# Patient Record
Sex: Female | Born: 1955 | Race: White | Hispanic: No | Marital: Married | Smoking: Never smoker
Health system: Southern US, Community
[De-identification: ages and names within clinical notes are randomized; demographics above are authoritative.]

## PROBLEM LIST (undated history)

## (undated) DIAGNOSIS — R112 Nausea with vomiting, unspecified: Secondary | ICD-10-CM

## (undated) DIAGNOSIS — T8859XA Other complications of anesthesia, initial encounter: Secondary | ICD-10-CM

## (undated) DIAGNOSIS — Z9889 Other specified postprocedural states: Secondary | ICD-10-CM

## (undated) DIAGNOSIS — M1990 Unspecified osteoarthritis, unspecified site: Secondary | ICD-10-CM

## (undated) DIAGNOSIS — T4145XA Adverse effect of unspecified anesthetic, initial encounter: Secondary | ICD-10-CM

## (undated) DIAGNOSIS — C801 Malignant (primary) neoplasm, unspecified: Secondary | ICD-10-CM

## (undated) HISTORY — PX: TUBAL LIGATION: SHX77

## (undated) HISTORY — PX: BREAST SURGERY: SHX581

## (undated) HISTORY — PX: JOINT REPLACEMENT: SHX530

## (undated) HISTORY — PX: EYE SURGERY: SHX253

## (undated) HISTORY — PX: KNEE ARTHROSCOPY: SHX127

## (undated) HISTORY — PX: REPLACEMENT TOTAL KNEE: SUR1224

## (undated) HISTORY — PX: COLONOSCOPY: SHX174

---

## 1988-06-12 HISTORY — PX: ABDOMINAL HYSTERECTOMY: SHX81

## 2004-06-15 ENCOUNTER — Ambulatory Visit: Payer: Self-pay | Admitting: General Surgery

## 2005-06-15 ENCOUNTER — Ambulatory Visit: Payer: Self-pay | Admitting: General Surgery

## 2005-06-30 ENCOUNTER — Ambulatory Visit: Payer: Self-pay | Admitting: General Surgery

## 2009-07-27 ENCOUNTER — Ambulatory Visit: Payer: Self-pay | Admitting: Unknown Physician Specialty

## 2009-07-27 LAB — HM DEXA SCAN

## 2009-07-30 ENCOUNTER — Ambulatory Visit: Payer: Self-pay | Admitting: Unknown Physician Specialty

## 2010-10-28 ENCOUNTER — Observation Stay: Payer: Self-pay | Admitting: Urology

## 2015-06-28 DIAGNOSIS — M533 Sacrococcygeal disorders, not elsewhere classified: Secondary | ICD-10-CM | POA: Insufficient documentation

## 2015-08-26 ENCOUNTER — Other Ambulatory Visit: Payer: Self-pay | Admitting: Unknown Physician Specialty

## 2015-08-26 ENCOUNTER — Inpatient Hospital Stay
Admission: RE | Admit: 2015-08-26 | Discharge: 2015-08-26 | Disposition: A | Discharge status: Home / Self Care | Payer: Self-pay | Source: Ambulatory Visit | Attending: *Deleted | Admitting: *Deleted

## 2015-08-26 ENCOUNTER — Other Ambulatory Visit: Payer: Self-pay | Admitting: *Deleted

## 2015-08-26 DIAGNOSIS — Z9289 Personal history of other medical treatment: Secondary | ICD-10-CM

## 2015-08-26 DIAGNOSIS — R921 Mammographic calcification found on diagnostic imaging of breast: Secondary | ICD-10-CM

## 2015-09-20 ENCOUNTER — Other Ambulatory Visit: Payer: Self-pay

## 2015-09-20 ENCOUNTER — Ambulatory Visit: Payer: Self-pay

## 2015-09-22 ENCOUNTER — Ambulatory Visit: Payer: Self-pay

## 2015-09-22 ENCOUNTER — Encounter: Payer: Self-pay | Admitting: General Surgery

## 2015-09-22 ENCOUNTER — Ambulatory Visit (INDEPENDENT_AMBULATORY_CARE_PROVIDER_SITE_OTHER): Payer: Self-pay | Admitting: General Surgery

## 2015-09-22 VITALS — BP 128/72 | HR 80 | Resp 12 | Ht 68.0 in | Wt 182.0 lb

## 2015-09-22 DIAGNOSIS — N63 Unspecified lump in unspecified breast: Secondary | ICD-10-CM

## 2015-09-22 NOTE — Patient Instructions (Signed)
Breast Biopsy A breast biopsy is a procedure where a sample of breast tissue is removed from your breast. The tissue is examined under a microscope to see if cancerous cells are present. A breast biopsy is done when there is:  Any undiagnosed breast mass (tumor).  Nipple abnormalities, dimpling, crusting, or ulcerations.  Abnormal discharge from the nipple, especially blood.  Redness, swelling, and pain of the breast.  Calcium deposits (calcifications) or abnormalities seen on a mammogram, ultrasound result, or results of magnetic resonance imaging (MRI).  Suspicious changes in the breast seen on your mammogram. If the tumor is found to be cancerous (malignant), a breast biopsy can help to determine what the best treatment is for you. There are many different types of breast biopsies. Talk to your caregiver about your options and which type is best for you. LET YOUR CAREGIVER KNOW ABOUT:  Allergies to food or medicine.  Medicines taken, including vitamins, herbs, eyedrops, over-the-counter medicines, and creams.  Use of steroids (by mouth or creams).  Previous problems with anesthetics or numbing medicines.  History of bleeding problems or blood clots.  Previous surgery.  Other health problems, including diabetes and kidney problems.  Any recent colds or infections.  Possibility of pregnancy, if this applies. RISKS AND COMPLICATIONS   Bleeding.  Infection.  Allergy to medicines.  Bruising and swelling of the breast.  Alteration in the shape of the breast.  Not finding the lump or abnormality.  Needing more surgery. BEFORE THE PROCEDURE  Arrange for someone to drive you home after the procedure.  Do not smoke for 2 weeks before the procedure. Stop smoking, if you smoke.  Do not drink alcohol for 24 hours before procedure.  Wear a good support bra to the procedure.  Your health care provider may perform a procedure to place a wire (needle localization) or a  seed that gives off radiation (radioactive seed localization) in the breast lump. A mammogram or ultrasound is done during this procedure to help with proper placement. The wire or seed will help the health care provider locate the lump when performing the biopsy, especially if the lump cannot be felt. PROCEDURE  You may be given a medicine to numb the breast area (local anesthesia) or a medicine to make you sleep (general anesthesia) during the procedure. The following are the different types of biopsies that can be performed.   Fine-needle aspiration--A thin needle is attached to a syringe and inserted into the breast lump. Fluid and cells are removed and then looked at under a microscope. If the breast lump cannot be felt, an ultrasound may be used to help locate the lump and place the needle in the correct area.   Core needle biopsy--A wide, hollow needle (core needle) is inserted into the breast lump 3-6 times to get tissue samples or cores. The samples are removed. The needle is usually placed in the correct area by using an ultrasound or X-ray.   Stereotactic biopsy--X-ray equipment and a computer are used to analyze X-ray pictures of the breast lump. The computer then finds exactly where the core needle needs to be inserted. Tissue samples are removed.   Vacuum-assisted biopsy--A small incision (less than  inch) is made in your breast. A biopsy device that includes a hollow needle and vacuum is passed through the incision and into the breast tissue. The vacuum gently draws abnormal breast tissue into the needle to remove it. This type of biopsy removes a larger tissue sample than a regular  core needle biopsy. No stitches are needed, and there is usually little scarring.  Ultrasound-guided core needle biopsy--A high frequency ultrasound helps guide the core needle to the area of the mass or abnormality. An incision is made to insert the needle. Tissue samples are removed.  Open biopsy--A  larger incision is made in the breast. Your caregiver will attempt to remove the whole breast lump or as much as possible. AFTER THE PROCEDURE  You will be taken to the recovery area. If you are doing well and have no problems, you will be allowed to go home.  You may notice bruising on your breast. This is normal.  Your caregiver may apply a pressure dressing on your breast for 24-48 hours. A pressure dressing is a bandage that is wrapped tightly around the chest to stop fluid from collecting underneath tissues.   This information is not intended to replace advice given to you by your health care provider. Make sure you discuss any questions you have with your health care provider.   Document Released: 05/29/2005 Document Revised: 02/17/2015 Document Reviewed: 06/29/2011 Elsevier Interactive Patient Education [employer].

## 2015-09-22 NOTE — Progress Notes (Signed)
Patient ID: Colleen Price, female   DOB: November 22, 1955, 60 y.o.   MRN: [id number]  Chief Complaint  Patient presents with  . Other    mammogram    HPI Colleen Price is a 60 y.o. female who presents for a breast evaluation. The most recent mammogram was done on 09/17/15.  Patient does perform regular self breast checks and gets regular mammograms done.  She states denies pain/tenderness, no lumps. Denies injury to the breasts. She states she was told there was a mass seen ultrasound/mammogram.  I have reviewed the history of present illness with the patient.  HPI  History reviewed. No pertinent past medical history.  Past Surgical History  Procedure Laterality Date  . Knee arthroscopy    . Abdominal hysterectomy  1990  . Colonoscopy      History reviewed. No pertinent family history.  Social History Social History  Substance Use Topics  . Smoking status: Never Smoker   . Smokeless tobacco: Never Used  . Alcohol Use: No    Allergies  Allergen Reactions  . Latex   . Conjugated Estrogens Nausea And Vomiting and Rash    Lupus like symptoms Lupus like symptoms    Current Outpatient Prescriptions  Medication Sig Dispense Refill  . Ascorbic Acid (VITAMIN C) 1000 MG tablet Take by mouth.    . Boswellia-Glucosamine-Vit D (HM GLUCOSAMINE & VITAMIN D3) TABS Take by mouth.    . Multiple Vitamin (MULTI-VITAMINS) TABS Take by mouth.     No current facility-administered medications for this visit.    Review of Systems Review of Systems  Constitutional: Negative.   Respiratory: Negative.   Cardiovascular: Negative.     Blood pressure 128/72, pulse 80, resp. rate 12, height 5\' 8"  (1.727 m), weight 182 lb (82.555 kg).  Physical Exam Physical Exam  Constitutional: She is oriented to person, place, and time. She appears well-developed and well-nourished.  Eyes: Conjunctivae are normal. No scleral icterus.  Neck: Neck supple.  Cardiovascular: Normal rate, regular rhythm and  normal heart sounds.   Pulmonary/Chest: Effort normal and breath sounds normal. Right breast exhibits mass. Right breast exhibits no inverted nipple, no nipple discharge, no skin change and no tenderness. Left breast exhibits no inverted nipple, no mass, no nipple discharge, no skin change and no tenderness.  2.5cm ill defined mass at 10 o'clock right breast  Left breast mass at 1 o'clock  Abdominal: Soft. Bowel sounds are normal. There is no hepatomegaly. There is no tenderness.  Lymphadenopathy:    She has no cervical adenopathy.    She has no axillary adenopathy.  Neurological: She is alert and oriented to person, place, and time.  Skin: Skin is warm and dry.    Data Reviewed Mammogram and [place].  Large suspicious mass right breast 10 ocl location. Left breast with a small suspicious mass at 1 ocl.  Right breast also has other tiny nodules. The 2 main areas of concern also with associated microcalcifications No axillary adenopathy noted Assessment    Highly suspicious findings. Core biopsy of right and left breast masses recommended and completed today with pt consent     Plan    If path confirms malignancy she will benefit with MRI. Pt will be notified when path is available.     PCP:  Ouida Sills  This information has been scribed by Rosita Kea G 09/23/2015, 8:29 AM

## 2015-09-23 ENCOUNTER — Encounter: Payer: Self-pay | Admitting: General Surgery

## 2015-09-27 ENCOUNTER — Ambulatory Visit: Payer: Self-pay

## 2015-09-27 ENCOUNTER — Encounter: Payer: Self-pay | Admitting: General Surgery

## 2015-09-27 ENCOUNTER — Ambulatory Visit (INDEPENDENT_AMBULATORY_CARE_PROVIDER_SITE_OTHER): Payer: Self-pay | Admitting: General Surgery

## 2015-09-27 VITALS — BP 130/80 | HR 80 | Resp 12 | Ht 68.0 in | Wt 182.0 lb

## 2015-09-27 DIAGNOSIS — C50911 Malignant neoplasm of unspecified site of right female breast: Secondary | ICD-10-CM

## 2015-09-27 DIAGNOSIS — R921 Mammographic calcification found on diagnostic imaging of breast: Secondary | ICD-10-CM

## 2015-09-27 NOTE — Progress Notes (Signed)
Received request from Veverly Fells at [facility]  to assess patient qualification for [facility].  Patient has been diagnosed with invasive mammary carcinoma of right breast.  [place] for mammogram results, but patient cancelled this appointment and went to [facility].  Requested Birads 5 results from [facility]. Patient has follow up appointment with Dr. Jamal Collin 09/27/15 at 2:00.  Coordinated [facility] appointment for this afternoon, but after speaking to patient, she states she is comfortable with her financial ability to cover medical expenses, and may not need appointment.  Introduced he to navigation service, and took Breast Cancer Treatment Handbook/folder with hospital services to Dr. Angie Fava office for her to pick up this afternoon.    Oncology Nurse Navigator Documentation  Navigator Location: [facility] (09/27/15 1100) Navigator Encounter Type: Introductory phone call;Telephone;Education;Screening;Diagnostic Results (09/27/15 1100) Telephone: Financial Assistance;Diagnostic Results (09/27/15 1100) Abnormal Finding Date: 09/17/15 (09/27/15 1100) Confirmed Diagnosis Date: 09/22/15 (09/27/15 1100)     Patient Visit Type: Follow-up (09/27/15 1100) Treatment Phase: Pre-Tx/Tx Discussion (09/27/15 1100) Barriers/Navigation Needs: Financial (09/27/15 1100)   Interventions: Coordination of Care (09/27/15 1100)            Acuity: Level 2 (09/27/15 1100)   Acuity Level 2: Initial guidance, education and coordination as needed;Other (Financial) (09/27/15 1100)     Time Spent with Patient: > 120 (09/27/15 1100)

## 2015-09-27 NOTE — Patient Instructions (Signed)
Call [phone] to arrange for bilateral breast MRI at the [facility].

## 2015-09-27 NOTE — Progress Notes (Signed)
This is a 60 year old female here today for results of breast biopsy. I have reviewed the history of present illness with the patient.   Path- right breast with invasuve ductal CA. Left side with FC changes. Pt advised on the report. ER/PR,Her 2 are pending. Pt was given adequate information regarding all factors involved in decision making regarding treatment.  Given the ill defined and likely large size of right breast cancer and finding of other tiny nodules in rt and left breast she will benefit with MRI. Pt is agreeable.     Patient to be scheduled for a bilateral breast MRI at the [facility]. This patient is to call and schedule MRI.   This patient was sent to have the following labs drawn at [facility] today: CBC, Met C, and CA 27-29.  PCP:  Ouida Sills This information has been scribed by Gaspar Cola CMA.

## 2015-09-28 ENCOUNTER — Telehealth: Payer: Self-pay | Admitting: *Deleted

## 2015-09-28 LAB — COMPREHENSIVE METABOLIC PANEL
A/G RATIO: 1.7 (ref 1.2–2.2)
ALBUMIN: 4.4 g/dL (ref 3.5–5.5)
ALK PHOS: 78 IU/L (ref 39–117)
ALT: 17 IU/L (ref 0–32)
AST: 20 IU/L (ref 0–40)
BILIRUBIN TOTAL: 0.5 mg/dL (ref 0.0–1.2)
BUN / CREAT RATIO: 21 (ref 9–23)
BUN: 16 mg/dL (ref 6–24)
CHLORIDE: 102 mmol/L (ref 96–106)
CO2: 25 mmol/L (ref 18–29)
Calcium: 9.9 mg/dL (ref 8.7–10.2)
Creatinine, Ser: 0.75 mg/dL (ref 0.57–1.00)
GFR calc non Af Amer: 88 mL/min/{1.73_m2} (ref >59)
GFR, EST AFRICAN AMERICAN: 101 mL/min/{1.73_m2} (ref >59)
GLOBULIN, TOTAL: 2.6 g/dL (ref 1.5–4.5)
Glucose: 102 mg/dL — ABNORMAL HIGH (ref 65–99)
Potassium: 4.9 mmol/L (ref 3.5–5.2)
SODIUM: 143 mmol/L (ref 134–144)
TOTAL PROTEIN: 7 g/dL (ref 6.0–8.5)

## 2015-09-28 LAB — CBC WITH DIFFERENTIAL/PLATELET
BASOS ABS: 0 10*3/uL (ref 0.0–0.2)
BASOS: 0 %
EOS (ABSOLUTE): 0.2 10*3/uL (ref 0.0–0.4)
EOS: 2 %
HEMATOCRIT: 40.7 % (ref 34.0–46.6)
HEMOGLOBIN: 13.6 g/dL (ref 11.1–15.9)
IMMATURE GRANS (ABS): 0 10*3/uL (ref 0.0–0.1)
Immature Granulocytes: 0 %
LYMPHS ABS: 2.8 10*3/uL (ref 0.7–3.1)
LYMPHS: 33 %
MCH: 28.9 pg (ref 26.6–33.0)
MCHC: 33.4 g/dL (ref 31.5–35.7)
MCV: 87 fL (ref 79–97)
MONOCYTES: 12 %
Monocytes Absolute: 1 10*3/uL — ABNORMAL HIGH (ref 0.1–0.9)
NEUTROS ABS: 4.5 10*3/uL (ref 1.4–7.0)
Neutrophils: 53 %
Platelets: 323 10*3/uL (ref 150–379)
RBC: 4.7 x10E6/uL (ref 3.77–5.28)
RDW: 13.6 % (ref 12.3–15.4)
WBC: 8.6 10*3/uL (ref 3.4–10.8)

## 2015-09-28 LAB — CANCER ANTIGEN 27.29: CA 27.29: 23.2 U/mL (ref 0.0–38.6)

## 2015-09-28 NOTE — Telephone Encounter (Signed)
-----   Message from Christene Lye, MD sent at 09/28/2015  7:49 AM EDT ----- Inform pt labs are normal. F/u as scheduled

## 2015-09-28 NOTE — Telephone Encounter (Signed)
Notified patient as instructed, patient pleased. Discussed follow-up appointments, patient agrees  

## 2015-09-28 NOTE — Progress Notes (Signed)
Quick Note:  Inform pt labs are normal. F/u as scheduled ______ 

## 2015-09-29 ENCOUNTER — Ambulatory Visit
Admission: RE | Admit: 2015-09-29 | Discharge: 2015-09-29 | Disposition: A | Discharge status: Home / Self Care | Payer: No Typology Code available for payment source | Source: Ambulatory Visit | Attending: General Surgery | Admitting: General Surgery

## 2015-09-29 DIAGNOSIS — C50911 Malignant neoplasm of unspecified site of right female breast: Secondary | ICD-10-CM

## 2015-09-29 MED ORDER — GADOBENATE DIMEGLUMINE 529 MG/ML IV SOLN
14.0000 mL | Freq: Once | INTRAVENOUS | Status: AC | PRN
  Administered 2015-09-29: 14 mL via INTRAVENOUS

## 2015-10-04 ENCOUNTER — Ambulatory Visit (INDEPENDENT_AMBULATORY_CARE_PROVIDER_SITE_OTHER): Payer: Self-pay | Admitting: General Surgery

## 2015-10-04 ENCOUNTER — Encounter: Payer: Self-pay | Admitting: General Surgery

## 2015-10-04 VITALS — BP 126/70 | HR 74 | Resp 12 | Wt 172.0 lb

## 2015-10-04 DIAGNOSIS — C50911 Malignant neoplasm of unspecified site of right female breast: Secondary | ICD-10-CM

## 2015-10-04 NOTE — Progress Notes (Signed)
This is a 60 year old female here today to discuss MRI done on 09/29/15 and treatment options. I have reviewed the history of present illness with the patient. MRI revealed a 2.1cm mass upper outer right breast. No other suspicious findings on the right or the left. I have discussed in detail patient's options as far as treatment. Patient to decide on lumpectomy with post surgical radiation or right breast mastectomy. Both procedures will require a sentinel node biopsy. Patient expressed interest in taking a holistic approach to her treatment. After reviewing the patients options as well as the risks and benifits of each treatment option, patient has decided to take time to decide which option she would like to procede with. Patient is also reluctant to consider chemotherapy. Mamoprint can be obtained after surgery which may help with this decision making. Patient to call the office with her decision.  PCP:  Ouida Sills This information has been scribed by Gaspar Cola CMA.

## 2015-10-05 ENCOUNTER — Encounter: Payer: Self-pay | Admitting: General Surgery

## 2015-10-06 ENCOUNTER — Telehealth: Payer: Self-pay | Admitting: *Deleted

## 2015-10-06 ENCOUNTER — Other Ambulatory Visit: Payer: Self-pay | Admitting: *Deleted

## 2015-10-06 DIAGNOSIS — C50911 Malignant neoplasm of unspecified site of right female breast: Secondary | ICD-10-CM

## 2015-10-06 NOTE — Telephone Encounter (Signed)
Patient called the office to report that she would like to be scheduled for a right breast lumpectomy.   This will be arranged for 10-14-15 at [facility].

## 2015-10-07 ENCOUNTER — Telehealth: Payer: Self-pay | Admitting: *Deleted

## 2015-10-07 NOTE — Telephone Encounter (Signed)
Message left for patient to call the office.   We need to review surgery instructions with the patient.

## 2015-10-07 NOTE — Telephone Encounter (Signed)
Patient called the office back and she was notified as instructed.   This patient verbalizes understanding.

## 2015-10-11 ENCOUNTER — Other Ambulatory Visit: Payer: Self-pay | Admitting: General Surgery

## 2015-10-11 DIAGNOSIS — C50411 Malignant neoplasm of upper-outer quadrant of right female breast: Secondary | ICD-10-CM

## 2015-10-12 ENCOUNTER — Encounter: Payer: Self-pay | Admitting: *Deleted

## 2015-10-12 ENCOUNTER — Other Ambulatory Visit: Payer: No Typology Code available for payment source

## 2015-10-12 NOTE — Patient Instructions (Signed)
  Your procedure is scheduled on: 10-14-15 Report to [facility] To find out your arrival time please call [phone] between 1PM - 3PM on 10-13-15  Remember: Instructions that are not followed completely may result in serious medical risk, up to and including death, or upon the discretion of your surgeon and anesthesiologist your surgery may need to be rescheduled.    _X___ 1. Do not eat food or drink liquids after midnight. No gum chewing or hard candies.     _X___ 2. No Alcohol for 24 hours before or after surgery.   ____ 3. Bring all medications with you on the day of surgery if instructed.    ____ 4. Notify your doctor if there is any change in your medical condition     (cold, fever, infections).     Do not wear jewelry, make-up, hairpins, clips or nail polish.  Do not wear lotions, powders, or perfumes. You may wear deodorant.  Do not shave 48 hours prior to surgery. Men may shave face and neck.  Do not bring valuables to the hospital.    [facility] is not responsible for any belongings or valuables.               Contacts, dentures or bridgework may not be worn into surgery.  Leave your suitcase in the car. After surgery it may be brought to your room.  For patients admitted to the hospital, discharge time is determined by your treatment team.   Patients discharged the day of surgery will not be allowed to drive home.   Please read over the following fact sheets that you were given:     ____ Take these medicines the morning of surgery with A SIP OF WATER:    1. NONE  2.   3.   4.  5.  6.  ____ Fleet Enema (as directed)   ____ Use CHG Soap as directed  ____ Use inhalers on the day of surgery  ____ Stop metformin 2 days prior to surgery    ____ Take 1/2 of usual insulin dose the night before surgery and none on the morning of surgery.   ____ Stop Coumadin/Plavix/aspirin-N/A  _X___ Stop Anti-inflammatories-NO NSAIDS OR ASA  PRODUCTS-TYLENOL OK TO TAKE   ____ Stop supplements until after surgery.    ____ Bring C-Pap to the hospital.

## 2015-10-14 ENCOUNTER — Ambulatory Visit: Payer: Self-pay

## 2015-10-14 ENCOUNTER — Encounter
Admission: RE | Disposition: A | Discharge status: Home / Self Care | Payer: Self-pay | Source: Ambulatory Visit | Attending: General Surgery

## 2015-10-14 ENCOUNTER — Ambulatory Visit: Payer: Self-pay | Admitting: Certified Registered Nurse Anesthetist

## 2015-10-14 ENCOUNTER — Encounter
Admission: RE | Admit: 2015-10-14 | Discharge: 2015-10-14 | Disposition: A | Discharge status: Home / Self Care | Payer: No Typology Code available for payment source | Source: Ambulatory Visit | Attending: General Surgery | Admitting: General Surgery

## 2015-10-14 ENCOUNTER — Ambulatory Visit
Admission: RE | Admit: 2015-10-14 | Discharge: 2015-10-14 | Disposition: A | Discharge status: Home / Self Care | Payer: Self-pay | Source: Ambulatory Visit | Attending: General Surgery | Admitting: General Surgery

## 2015-10-14 DIAGNOSIS — Z888 Allergy status to other drugs, medicaments and biological substances status: Secondary | ICD-10-CM | POA: Insufficient documentation

## 2015-10-14 DIAGNOSIS — D0511 Intraductal carcinoma in situ of right breast: Secondary | ICD-10-CM | POA: Insufficient documentation

## 2015-10-14 DIAGNOSIS — Z79899 Other long term (current) drug therapy: Secondary | ICD-10-CM | POA: Insufficient documentation

## 2015-10-14 DIAGNOSIS — C50411 Malignant neoplasm of upper-outer quadrant of right female breast: Secondary | ICD-10-CM

## 2015-10-14 DIAGNOSIS — Z91040 Latex allergy status: Secondary | ICD-10-CM | POA: Insufficient documentation

## 2015-10-14 DIAGNOSIS — C50911 Malignant neoplasm of unspecified site of right female breast: Secondary | ICD-10-CM | POA: Insufficient documentation

## 2015-10-14 HISTORY — DX: Nausea with vomiting, unspecified: R11.2

## 2015-10-14 HISTORY — DX: Unspecified osteoarthritis, unspecified site: M19.90

## 2015-10-14 HISTORY — DX: Other complications of anesthesia, initial encounter: T88.59XA

## 2015-10-14 HISTORY — DX: Other specified postprocedural states: Z98.890

## 2015-10-14 HISTORY — PX: BREAST LUMPECTOMY WITH SENTINEL LYMPH NODE BIOPSY: SHX5597

## 2015-10-14 HISTORY — DX: Adverse effect of unspecified anesthetic, initial encounter: T41.45XA

## 2015-10-14 HISTORY — DX: Malignant (primary) neoplasm, unspecified: C80.1

## 2015-10-14 SURGERY — BREAST LUMPECTOMY WITH SENTINEL LYMPH NODE BX
Anesthesia: General | Laterality: Right | Wound class: Clean

## 2015-10-14 MED ORDER — FENTANYL CITRATE (PF) 100 MCG/2ML IJ SOLN
INTRAMUSCULAR | Status: AC
  Administered 2015-10-14: 25 ug via INTRAVENOUS
  Filled 2015-10-14: qty 2

## 2015-10-14 MED ORDER — EPHEDRINE SULFATE 50 MG/ML IJ SOLN
INTRAMUSCULAR | Status: DC | PRN
  Administered 2015-10-14: 5 mg via INTRAVENOUS

## 2015-10-14 MED ORDER — MIDAZOLAM HCL 5 MG/ML IJ SOLN: 2.0000 mg | Freq: Once | INTRAMUSCULAR | Status: DC

## 2015-10-14 MED ORDER — TRAMADOL HCL 50 MG PO TABS: 50.0000 mg | ORAL_TABLET | Freq: Four times a day (QID) | ORAL | Status: DC | PRN

## 2015-10-14 MED ORDER — FAMOTIDINE 20 MG PO TABS: 20.0000 mg | ORAL_TABLET | Freq: Once | ORAL | Status: DC

## 2015-10-14 MED ORDER — LIDOCAINE HCL (CARDIAC) 20 MG/ML IV SOLN
INTRAVENOUS | Status: DC | PRN
  Administered 2015-10-14: 50 mg via INTRAVENOUS

## 2015-10-14 MED ORDER — SODIUM CHLORIDE 0.9 % IJ SOLN
INTRAMUSCULAR | Status: AC
  Filled 2015-10-14: qty 10

## 2015-10-14 MED ORDER — FENTANYL CITRATE (PF) 100 MCG/2ML IJ SOLN
INTRAMUSCULAR | Status: DC | PRN
  Administered 2015-10-14 (×2): 50 ug via INTRAVENOUS
  Administered 2015-10-14 (×2): 25 ug via INTRAVENOUS

## 2015-10-14 MED ORDER — KETOROLAC TROMETHAMINE 30 MG/ML IJ SOLN
INTRAMUSCULAR | Status: DC | PRN
  Administered 2015-10-14: 30 mg via INTRAVENOUS

## 2015-10-14 MED ORDER — DEXAMETHASONE SODIUM PHOSPHATE 10 MG/ML IJ SOLN
INTRAMUSCULAR | Status: DC | PRN
  Administered 2015-10-14: 10 mg via INTRAVENOUS

## 2015-10-14 MED ORDER — FENTANYL CITRATE (PF) 100 MCG/2ML IJ SOLN
25.0000 ug | INTRAMUSCULAR | Status: DC | PRN
  Administered 2015-10-14 (×4): 25 ug via INTRAVENOUS

## 2015-10-14 MED ORDER — LACTATED RINGERS IV SOLN
INTRAVENOUS | Status: DC
  Administered 2015-10-14: 11:00:00 via INTRAVENOUS

## 2015-10-14 MED ORDER — PROPOFOL 10 MG/ML IV BOLUS
INTRAVENOUS | Status: DC | PRN
  Administered 2015-10-14: 200 mg via INTRAVENOUS

## 2015-10-14 MED ORDER — FAMOTIDINE 20 MG PO TABS
ORAL_TABLET | ORAL | Status: AC
  Administered 2015-10-14: 20 mg
  Filled 2015-10-14: qty 1

## 2015-10-14 MED ORDER — CEFAZOLIN SODIUM-DEXTROSE 2-4 GM/100ML-% IV SOLN
INTRAVENOUS | Status: AC
  Filled 2015-10-14: qty 100

## 2015-10-14 MED ORDER — CEFAZOLIN SODIUM-DEXTROSE 2-4 GM/100ML-% IV SOLN
2.0000 g | INTRAVENOUS | Status: AC
  Administered 2015-10-14: 2 g via INTRAVENOUS

## 2015-10-14 MED ORDER — ACETAMINOPHEN 10 MG/ML IV SOLN
INTRAVENOUS | Status: DC | PRN
  Administered 2015-10-14: 1000 mg via INTRAVENOUS

## 2015-10-14 MED ORDER — ACETAMINOPHEN 10 MG/ML IV SOLN
INTRAVENOUS | Status: AC
  Filled 2015-10-14: qty 100

## 2015-10-14 MED ORDER — ONDANSETRON HCL 4 MG/2ML IJ SOLN
INTRAMUSCULAR | Status: DC | PRN
  Administered 2015-10-14: 4 mg via INTRAVENOUS

## 2015-10-14 MED ORDER — MIDAZOLAM HCL 2 MG/2ML IJ SOLN
INTRAMUSCULAR | Status: AC
  Administered 2015-10-14: 2 mg
  Filled 2015-10-14: qty 2

## 2015-10-14 MED ORDER — SCOPOLAMINE 1 MG/3DAYS TD PT72: 1.0000 | MEDICATED_PATCH | Freq: Once | TRANSDERMAL | Status: DC

## 2015-10-14 MED ORDER — BUPIVACAINE HCL (PF) 0.5 % IJ SOLN
INTRAMUSCULAR | Status: AC
  Filled 2015-10-14: qty 30

## 2015-10-14 MED ORDER — TECHNETIUM TC 99M SULFUR COLLOID
0.9810 | Freq: Once | INTRAVENOUS | Status: AC | PRN
  Administered 2015-10-14: 0.981 via INTRAVENOUS

## 2015-10-14 MED ORDER — SCOPOLAMINE 1 MG/3DAYS TD PT72
MEDICATED_PATCH | TRANSDERMAL | Status: AC
  Administered 2015-10-14: 10:00:00
  Filled 2015-10-14: qty 1

## 2015-10-14 MED ORDER — ONDANSETRON HCL 4 MG/2ML IJ SOLN: 4.0000 mg | Freq: Once | INTRAMUSCULAR | Status: DC | PRN

## 2015-10-14 MED ORDER — CHLORHEXIDINE GLUCONATE 4 % EX LIQD: 1.0000 "application " | Freq: Once | CUTANEOUS | Status: DC

## 2015-10-14 MED ORDER — BUPIVACAINE HCL (PF) 0.5 % IJ SOLN
INTRAMUSCULAR | Status: DC | PRN
  Administered 2015-10-14: 10 mL

## 2015-10-14 MED ORDER — METHYLENE BLUE 0.5 % INJ SOLN
INTRAVENOUS | Status: AC
  Filled 2015-10-14: qty 10

## 2015-10-14 SURGICAL SUPPLY — 37 items
BLADE SURG 15 STRL SS SAFETY (BLADE) ×3 IMPLANT
BULB RESERV EVAC DRAIN JP 100C (MISCELLANEOUS) IMPLANT
CANISTER SUCT 1200ML W/VALVE (MISCELLANEOUS) ×3 IMPLANT
CHLORAPREP W/TINT 26ML (MISCELLANEOUS) ×3 IMPLANT
CLOSURE WOUND 1/2 X4 (GAUZE/BANDAGES/DRESSINGS)
CNTNR SPEC 2.5X3XGRAD LEK (MISCELLANEOUS) ×2
CONT SPEC 4OZ STER OR WHT (MISCELLANEOUS) ×4
CONTAINER SPEC 2.5X3XGRAD LEK (MISCELLANEOUS) ×2 IMPLANT
COVER PROBE FLX POLY STRL (MISCELLANEOUS) ×3 IMPLANT
DEVICE DUBIN SPECIMEN MAMMOGRA (MISCELLANEOUS) ×3 IMPLANT
DEVICE LOCALIZATION ULTRAWIRE (WIRE) ×1 IMPLANT
DRAIN CHANNEL JP 15F RND 16 (MISCELLANEOUS) IMPLANT
DRAPE LAPAROTOMY TRNSV 106X77 (MISCELLANEOUS) ×3 IMPLANT
ELECT REM PT RETURN 9FT ADLT (ELECTROSURGICAL) ×3
ELECTRODE REM PT RTRN 9FT ADLT (ELECTROSURGICAL) ×1 IMPLANT
GLOVE BIO SURGEON STRL SZ7 (GLOVE) ×21 IMPLANT
GOWN STRL REUS W/ TWL LRG LVL3 (GOWN DISPOSABLE) ×4 IMPLANT
GOWN STRL REUS W/TWL LRG LVL3 (GOWN DISPOSABLE) ×8
HARMONIC SCALPEL FOCUS (MISCELLANEOUS) IMPLANT
KIT RM TURNOVER STRD PROC AR (KITS) ×3 IMPLANT
LABEL OR SOLS (LABEL) ×3 IMPLANT
LIQUID BAND (GAUZE/BANDAGES/DRESSINGS) ×3 IMPLANT
MARGIN MAP 10MM (MISCELLANEOUS) ×3 IMPLANT
NDL SAFETY 22GX1.5 (NEEDLE) ×3 IMPLANT
NEEDLE HYPO 25X1 1.5 SAFETY (NEEDLE) ×3 IMPLANT
PACK BASIN MINOR ARMC (MISCELLANEOUS) ×3 IMPLANT
SLEVE PROBE SENORX GAMMA FIND (MISCELLANEOUS) ×3 IMPLANT
STRIP CLOSURE SKIN 1/2X4 (GAUZE/BANDAGES/DRESSINGS) IMPLANT
SUT ETH BLK MONO 3 0 FS 1 12/B (SUTURE) ×6 IMPLANT
SUT MNCRL AB 3-0 PS2 27 (SUTURE) ×3 IMPLANT
SUT VIC AB 2-0 BRD 54 (SUTURE) ×3 IMPLANT
SUT VIC AB 2-0 CT1 (SUTURE) ×6 IMPLANT
SUT VIC AB 2-0 CT2 27 (SUTURE) ×6 IMPLANT
SYR CONTROL 10ML (SYRINGE) ×3 IMPLANT
SYRINGE 10CC LL (SYRINGE) ×3 IMPLANT
ULTRAWIRE LOCALIZATION DEVICE (WIRE) ×3
WATER STERILE IRR 1000ML POUR (IV SOLUTION) ×3 IMPLANT

## 2015-10-14 NOTE — Interval H&P Note (Signed)
History and Physical Interval Note:  10/14/2015 10:54 AM  Colleen Price  has presented today for surgery, with the diagnosis of RIGHT BREAST CANCER  The various methods of treatment have been discussed with the patient and family. After consideration of risks, benefits and other options for treatment, the patient has consented to  Procedure(s): BREAST LUMPECTOMY WITH SENTINEL LYMPH NODE BX (Right) as a surgical intervention .  The patient's history has been reviewed, patient examined, no change in status, stable for surgery.  I have reviewed the patient's chart and labs.  Questions were answered to the patient's satisfaction.     SANKAR,SEEPLAPUTHUR G

## 2015-10-14 NOTE — Anesthesia Postprocedure Evaluation (Signed)
Anesthesia Post Note  Patient: Colleen Price  Procedure(s) Performed: Procedure(s) (LRB): BREAST LUMPECTOMY WITH SENTINEL LYMPH NODE BX (Right)  Patient location during evaluation: PACU Anesthesia Type: General Level of consciousness: awake and alert Pain management: pain level controlled Vital Signs Assessment: post-procedure vital signs reviewed and stable Respiratory status: spontaneous breathing and respiratory function stable Cardiovascular status: stable Anesthetic complications: no    Last Vitals:  Filed Vitals:   10/14/15 1403 10/14/15 1446  BP: 131/65 123/66  Pulse: 72 72  Temp: 35.6 C   Resp: 16     Last Pain:  Filed Vitals:   10/14/15 1446  PainSc: 1                  KEPHART,WILLIAM K

## 2015-10-14 NOTE — Anesthesia Preprocedure Evaluation (Signed)
Anesthesia Evaluation  Patient identified by MRN, date of birth, ID band Patient awake    Reviewed: Allergy & Precautions, NPO status , Patient's Chart, lab work & pertinent test results  History of Anesthesia Complications (+) PONV  Airway Mallampati: II       Dental  (+) Teeth Intact   Pulmonary neg pulmonary ROS,    breath sounds clear to auscultation       Cardiovascular Exercise Tolerance: Good  Rhythm:Regular Rate:Normal     Neuro/Psych negative neurological ROS  negative psych ROS   GI/Hepatic negative GI ROS, Neg liver ROS,   Endo/Other  negative endocrine ROS  Renal/GU negative Renal ROS     Musculoskeletal   Abdominal Normal abdominal exam  (+)   Peds  Hematology negative hematology ROS (+)   Anesthesia Other Findings   Reproductive/Obstetrics                             Anesthesia Physical Anesthesia Plan  ASA: I  Anesthesia Plan: General   Post-op Pain Management:    Induction: Intravenous  Airway Management Planned: LMA  Additional Equipment:   Intra-op Plan:   Post-operative Plan: Extubation in OR  Informed Consent: I have reviewed the patients History and Physical, chart, labs and discussed the procedure including the risks, benefits and alternatives for the proposed anesthesia with the patient or authorized representative who has indicated his/her understanding and acceptance.     Plan Discussed with: CRNA  Anesthesia Plan Comments:         Anesthesia Quick Evaluation

## 2015-10-14 NOTE — H&P (View-Only) (Signed)
This is a 60 year old female here today to discuss MRI done on 09/29/15 and treatment options. I have reviewed the history of present illness with the patient. MRI revealed a 2.1cm mass upper outer right breast. No other suspicious findings on the right or the left. I have discussed in detail patient's options as far as treatment. Patient to decide on lumpectomy with post surgical radiation or right breast mastectomy. Both procedures will require a sentinel node biopsy. Patient expressed interest in taking a holistic approach to her treatment. After reviewing the patients options as well as the risks and benifits of each treatment option, patient has decided to take time to decide which option she would like to procede with. Patient is also reluctant to consider chemotherapy. Mamoprint can be obtained after surgery which may help with this decision making. Patient to call the office with her decision.  PCP:  Ouida Sills This information has been scribed by Gaspar Cola CMA.

## 2015-10-14 NOTE — Op Note (Signed)
Preop diagnosis: Invasive mammary carcinoma right breast  Post op diagnosis: Same  Operation: Right breast partial mastectomy and sentinel node biopsy  Surgeon: S.G.Kameryn Davern  Assistant:     Anesthesia: Gen.  Complications: None  EBL: Less than 25 mL  Drains: None  Description: Patient was put to sleep in supine position the operating table she had preoperative nuclear contrast injection the right breast. Excellent signal activity was noted in the inferior portion of the right axilla just below posterior to the anterior fold. The right breast and axilla were prepped and draped as sterile field and timeout performed. A skin incision along the crease of the skin overlying the side of from activity over the axilla was made with the 10 mL of 0.5% Marcaine instilled for postop analgesia. Incision was deepened through one using the Gamma finder 3 separate nodes were identified Klosterman in the same region. These were then removed as sentinel nodes 1,2 and 3 and sent to pathology. Signal activity completely died after removal of these 3 nodes and no other visible or palpable nodes were noted. The subcutaneous tissue and deep tissues were closed with interrupted 2-0 Vicryl. Skin closed with subcuticular 3-0 Monocryl. Ultrasound was brought up to the field with a sterile cover and the mass at the 9 to 10:00 position mid outer right breast was identified. Through a small stab incision of a Bard ultralight Y was positioned to anchor the area. The slightly curved incision along the outer aspect was made from the level o'clock to 8:00 location and carefully deepened through this the subcutaneous tissue. The skin and subcutaneous tissue were elevated on both sides lateral and medial and also superior and inferior by finger palpation the mass in question was circumferentially freed with cautery and excised out completely it didn't appear that the closest area was towards the skin but grossly did not seem to be  involving the's this portion of the specimen. The cavity was inspected and noted that there was a 1cm area palpable and visible in the caudal deep margin and this was separately excised and a new margin marked for this. The main lumpectomy specimen was marked for margins and specimen mammogram was obtained showing the presence of the previously placed clip within the tissue. This was sent to pathology with the initial gross examination showing the margins being clear with the closest being of the skin and caudal and deep region. After ensuring hemostasis the wound was irrigated and closed. Deep tissue was closed with 2-0 Vicryl and the skin with subcuticular 3-0 Monocryl. Liquid ban was applied on both incisions. Patient subsequently extubated and returned recovery room in stable condition

## 2015-10-14 NOTE — Transfer of Care (Signed)
Immediate Anesthesia Transfer of Care Note  Patient: Colleen Price  Procedure(s) Performed: Procedure(s): BREAST LUMPECTOMY WITH SENTINEL LYMPH NODE BX (Right)  Patient Location: PACU  Anesthesia Type:General  Level of Consciousness: sedated  Airway & Oxygen Therapy: Patient Spontanous Breathing and Patient connected to face mask oxygen  Post-op Assessment: Report given to RN and Post -op Vital signs reviewed and stable  Post vital signs: Reviewed and stable  Last Vitals:  Filed Vitals:   10/14/15 1009  BP: 138/60  Pulse: 71  Temp: 36.8 C  Resp: 16    Last Pain: There were no vitals filed for this visit.       Complications: No apparent anesthesia complications

## 2015-10-14 NOTE — Discharge Instructions (Signed)
AMBULATORY SURGERY  °DISCHARGE INSTRUCTIONS ° ° °1) The drugs that you were given will stay in your system until tomorrow so for the next 24 hours you should not: ° °A) Drive an automobile °B) Make any legal decisions °C) Drink any alcoholic beverage ° ° °2) You may resume regular meals tomorrow.  Today it is better to start with liquids and gradually work up to solid foods. ° °You may eat anything you prefer, but it is better to start with liquids, then soup and crackers, and gradually work up to solid foods. ° ° °3) Please notify your doctor immediately if you have any unusual bleeding, trouble breathing, redness and pain at the surgery site, drainage, fever, or pain not relieved by medication. ° ° ° °4) Additional Instructions: ° ° ° ° ° ° ° °Please contact your physician with any problems or Same Day Surgery at [phone], Monday through Friday 6 am to 4 pm, or [facility] at [facility] number at [phone].AMBULATORY SURGERY  °DISCHARGE INSTRUCTIONS ° ° °5) The drugs that you were given will stay in your system until tomorrow so for the next 24 hours you should not: ° °D) Drive an automobile °E) Make any legal decisions °F) Drink any alcoholic beverage ° ° °6) You may resume regular meals tomorrow.  Today it is better to start with liquids and gradually work up to solid foods. ° °You may eat anything you prefer, but it is better to start with liquids, then soup and crackers, and gradually work up to solid foods. ° ° °7) Please notify your doctor immediately if you have any unusual bleeding, trouble breathing, redness and pain at the surgery site, drainage, fever, or pain not relieved by medication. ° ° ° °8) Additional Instructions: ° ° ° ° ° ° ° °Please contact your physician with any problems or Same Day Surgery at [phone], Monday through Friday 6 am to 4 pm, or  at [facility] number at [phone]. °

## 2015-10-14 NOTE — H&P (View-Only) (Signed)
Patient ID: Colleen Price, female   DOB: [birth year], 60 y.o.   MRN: [id number]  Chief Complaint  Patient presents with  . Other    mammogram    HPI Colleen Price is a 60 y.o. female who presents for a breast evaluation. The most recent mammogram was done on 09/17/15.  Patient does perform regular self breast checks and gets regular mammograms done.  She states denies pain/tenderness, no lumps. Denies injury to the breasts. She states she was told there was a mass seen ultrasound/mammogram.  I have reviewed the history of present illness with the patient.  HPI  History reviewed. No pertinent past medical history.  Past Surgical History  Procedure Laterality Date  . Knee arthroscopy    . Abdominal hysterectomy  1990  . Colonoscopy      History reviewed. No pertinent family history.  Social History Social History  Substance Use Topics  . Smoking status: Never Smoker   . Smokeless tobacco: Never Used  . Alcohol Use: No    Allergies  Allergen Reactions  . Latex   . Conjugated Estrogens Nausea And Vomiting and Rash    Lupus like symptoms Lupus like symptoms    Current Outpatient Prescriptions  Medication Sig Dispense Refill  . Ascorbic Acid (VITAMIN C) 1000 MG tablet Take by mouth.    . [place]wellia-Glucosamine-Vit D (HM GLUCOSAMINE & VITAMIN D3) TABS Take by mouth.    . Multiple Vitamin (MULTI-VITAMINS) TABS Take by mouth.     No current facility-administered medications for this visit.    Review of Systems Review of Systems  Constitutional: Negative.   Respiratory: Negative.   Cardiovascular: Negative.     Blood pressure 128/72, pulse 80, resp. rate 12, height 5\' 8"  (1.727 m), weight 182 lb (82.555 kg).  Physical Exam Physical Exam  Constitutional: She is oriented to person, place, and time. She appears well-developed and well-nourished.  Eyes: Conjunctivae are normal. No scleral icterus.  Neck: Neck supple.  Cardiovascular: Normal rate, regular rhythm and  normal heart sounds.   Pulmonary/Chest: Effort normal and breath sounds normal. Right breast exhibits mass. Right breast exhibits no inverted nipple, no nipple discharge, no skin change and no tenderness. Left breast exhibits no inverted nipple, no mass, no nipple discharge, no skin change and no tenderness.  2.5cm ill defined mass at 10 o'clock right breast  Left breast mass at 1 o'clock  Abdominal: Soft. Bowel sounds are normal. There is no hepatomegaly. There is no tenderness.  Lymphadenopathy:    She has no cervical adenopathy.    She has no axillary adenopathy.  Neurological: She is alert and oriented to person, place, and time.  Skin: Skin is warm and dry.    Data Reviewed Mammogram and [place].  Large suspicious mass right breast 10 ocl location. Left breast with a small suspicious mass at 1 ocl.  Right breast also has other tiny nodules. The 2 main areas of concern also with associated microcalcifications No axillary adenopathy noted Assessment    Highly suspicious findings. Core biopsy of right and left breast masses recommended and completed today with pt consent     Plan    If path confirms malignancy she will benefit with MRI. Pt will be notified when path is available.     PCP:  Ouida Sills  This information has been scribed by Rosita Kea G 09/23/2015, 8:29 AM

## 2015-10-14 NOTE — Interval H&P Note (Signed)
History and Physical Interval Note:  10/14/2015 10:55 AM  Colleen Price  has presented today for surgery, with the diagnosis of RIGHT BREAST CANCER  The various methods of treatment have been discussed with the patient and family. After consideration of risks, benefits and other options for treatment, the patient has consented to  Procedure(s): BREAST LUMPECTOMY WITH SENTINEL LYMPH NODE BX (Right) as a surgical intervention .  The patient's history has been reviewed, patient examined, no change in status, stable for surgery.  I have reviewed the patient's chart and labs.  Questions were answered to the patient's satisfaction.     [name]

## 2015-10-14 NOTE — Anesthesia Procedure Notes (Signed)
Procedure Name: LMA Insertion Performed by: Rolla Plate Pre-anesthesia Checklist: Patient identified, Patient being monitored, Timeout performed, Emergency Drugs available and Suction available Patient Re-evaluated:Patient Re-evaluated prior to inductionOxygen Delivery Method: Circle system utilized Preoxygenation: Pre-oxygenation with 100% oxygen Intubation Type: IV induction Ventilation: Mask ventilation without difficulty LMA: LMA inserted LMA Size: 3.0 Tube type: Oral Number of attempts: 2 Placement Confirmation: positive ETCO2 and breath sounds checked- equal and bilateral Tube secured with: Tape Dental Injury: Teeth and Oropharynx as per pre-operative assessment

## 2015-10-18 LAB — SURGICAL PATHOLOGY

## 2015-10-21 ENCOUNTER — Ambulatory Visit (INDEPENDENT_AMBULATORY_CARE_PROVIDER_SITE_OTHER): Payer: Self-pay | Admitting: General Surgery

## 2015-10-21 ENCOUNTER — Encounter: Payer: Self-pay | Admitting: General Surgery

## 2015-10-21 VITALS — BP 122/66 | HR 76 | Resp 14 | Ht 68.0 in | Wt 169.0 lb

## 2015-10-21 DIAGNOSIS — C50911 Malignant neoplasm of unspecified site of right female breast: Secondary | ICD-10-CM

## 2015-10-21 NOTE — Progress Notes (Signed)
Patient ID: Colleen Price, female   DOB: 1955/10/18, 60 y.o.   MRN: [id number] Patient here today for one week post op follow up for a right breast partial mastectomy and sentinel node biopsy done on 10/14/15. Patient states she is doing well.  I have reviewed the history of present illness with the patient.  Right breast incision is clean and healing well.  Pathology- 1.7cm primary, nodes negative pT1c, pN0 Discussed need for radiation-may be a candidate for mammosite Referral to radiation oncology.   Patient has been scheduled for an appointment to see Dr. Baruch Gouty at the [facility] for 10-28-15 at 10:30 am. This patient is aware of date, time, and instructions.         PCP: Dr. Ouida Sills  This information has been scribed by Gaspar Cola CMA.

## 2015-10-21 NOTE — Patient Instructions (Addendum)
Call with any concerns or questions. Follow up appointment to be announced.   Patient has been scheduled for an appointment to see Dr. Baruch Gouty at the [facility] for 10-28-15 at 10:30 am. This patient is aware of date, time, and instructions.

## 2015-10-25 ENCOUNTER — Encounter: Payer: Self-pay | Admitting: General Surgery

## 2015-10-28 ENCOUNTER — Encounter: Payer: Self-pay | Admitting: Radiation Oncology

## 2015-10-28 ENCOUNTER — Ambulatory Visit
Admission: RE | Admit: 2015-10-28 | Discharge: 2015-10-28 | Disposition: A | Discharge status: Home / Self Care | Payer: No Typology Code available for payment source | Source: Ambulatory Visit | Attending: Radiation Oncology | Admitting: Radiation Oncology

## 2015-10-28 VITALS — BP 134/73 | HR 71 | Temp 96.6°F | Resp 18 | Wt 169.1 lb

## 2015-10-28 DIAGNOSIS — C50411 Malignant neoplasm of upper-outer quadrant of right female breast: Secondary | ICD-10-CM

## 2015-10-28 NOTE — Consult Note (Signed)
Except an outstanding is perfect of Radiation Oncology NEW PATIENT EVALUATION  Name: Colleen Price  MRN: [id number]  Date:   10/28/2015     DOB: [birth year]   This 60 y.o. 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  REFERRING PHYSICIAN: Kirk Ruths, MD  CHIEF COMPLAINT:  Chief Complaint  Patient presents with  . Breast Cancer    Pt is here for initial consultation of breast cancer.     DIAGNOSIS: The encounter diagnosis was Malignant neoplasm of upper-outer quadrant of right female breast ([place]).   PREVIOUS INVESTIGATIONS:  Mammograms ultrasound and MRI scans reviewed Pathology report reviewed Clinical notes reviewed  HPI: Patient is a 60 year old female had her first mammogram in over 3 years. She had an abnormal mammogram showing a proximal 22 cm lesion in the upper outer quadrant of the right breast which was confirmed on ultrasound as well as MRI scan. No other evidence of breast cancer and MRI scan was noted. A she underwent biopsy which was positive for invasive mammary carcinoma. Went on to have a wide local excision showing a 1.7 cm invasive mammary carcinoma overall grade 3 with margin close at 1.5 mm. Tumor was strongly ER/PR PR positive HER-2/neu not overexpressed. 3 sentinel lymph nodes were negative. Patient has done her own research is determined that probably vegetable diet and immunotherapy diet can treat her breast cancer has completely dismissed any chances of chemotherapy and is seen today for radiation oncology opinion. She is doing well she specifically denies breast tenderness cough or bone pain.  PLANNED TREATMENT REGIMEN: Possible accelerated partial breast irradiation  PAST MEDICAL HISTORY:  has a past medical history of Cancer ([place]); Arthritis; Complication of  anesthesia; and PONV (postoperative nausea and vomiting).    PAST SURGICAL HISTORY:  Past Surgical History  Procedure Laterality Date  . Knee arthroscopy    . Abdominal hysterectomy  1990  . Colonoscopy    . Breast lumpectomy with sentinel lymph node biopsy Right 10/14/2015    Procedure: BREAST LUMPECTOMY WITH SENTINEL LYMPH NODE BX;  Surgeon: Christene Lye, MD;  Location: [facility] ORS;  Service: General;  Laterality: Right;    FAMILY HISTORY: family history is not on file.  SOCIAL HISTORY:  reports that she has never smoked. She has never used smokeless tobacco. She reports that she does not drink alcohol or use illicit drugs.  ALLERGIES: Conjugated estrogens and Latex  MEDICATIONS:  No current outpatient prescriptions on file.   No current facility-administered medications for this encounter.    ECOG PERFORMANCE STATUS:  0 - Asymptomatic  REVIEW OF SYSTEMS:  Patient denies any weight loss, fatigue, weakness, fever, chills or night sweats. Patient denies any loss of vision, blurred vision. Patient denies any ringing  of the ears or hearing loss. No irregular heartbeat. Patient denies heart murmur or history of fainting. Patient denies any chest pain or pain radiating to her upper extremities. Patient denies any shortness of breath, difficulty breathing at night, cough or hemoptysis. Patient denies any swelling in the lower legs. Patient denies any nausea vomiting, vomiting of blood, or coffee ground material in the vomitus. Patient denies any stomach pain. Patient states has had normal bowel movements no significant constipation or diarrhea. Patient denies any dysuria, hematuria or significant nocturia. Patient denies any problems walking, swelling in the joints  or loss of balance. Patient denies any skin changes, loss of hair or loss of weight. Patient denies any excessive worrying or anxiety or significant depression. Patient denies any problems with insomnia. Patient denies excessive  thirst, polyuria, polydipsia. Patient denies any swollen glands, patient denies easy bruising or easy bleeding. Patient denies any recent infections, allergies or URI. Patient "s visual fields have not changed significantly in recent time.    PHYSICAL EXAM: BP 134/73 mmHg  Pulse 71  Temp(Src) 96.6 F (35.9 C)  Resp 18  Wt 169 lb 1.5 oz (76.7 kg) Well-developed female in NAD. Right breast is wide local excision scar neck and sentinel lymph node scar both healing well no dominant mass or nodularity is noted in either breast in 2 positions examined. No axillary or supraclavicular adenopathy is identified. Well-developed well-nourished patient in NAD. HEENT reveals PERLA, EOMI, discs not visualized.  Oral cavity is clear. No oral mucosal lesions are identified. Neck is clear without evidence of cervical or supraclavicular adenopathy. Lungs are clear to A&P. Cardiac examination is essentially unremarkable with regular rate and rhythm without murmur rub or thrill. Abdomen is benign with no organomegaly or masses noted. Motor sensory and DTR levels are equal and symmetric in the upper and lower extremities. Cranial nerves II through XII are grossly intact. Proprioception is intact. No peripheral adenopathy or edema is identified. No motor or sensory levels are noted. Crude visual fields are within normal range.  LABORATORY DATA: Pathology reports are reviewed    RADIOLOGY RESULTS: Ultrasound mammograms and MRI scans reviewed   IMPRESSION: Stage I invasive mammary carcinoma the right breast as was wide local excision and sentinel node biopsy ER/PR positive in 60 year old female  PLAN: I've got a long discussion with the patient on rationale for adjuvant radiation therapy. Especially since she is a grade 3 with close margin 1.5 mm believe she definitely needs adjuvant radiation therapy. I've described both whole breast radiation as well as accelerated partial breast radiation. Risks and benefits of both  procedures were described in detail to the patient. Side effects such as skin reaction fatigue alteration of blood counts possible thickening of the lumpectomy cavity all were discussed in detail with the patient and her husband. I've also expressed the need to possibly do a man a print to determine whether she would benefit from systemic chemotherapy although again patient is highly reluctant to proceed with chemotherapy. Patient will discuss with her husband and decide of the next several days about her course of treatment. Should she decide for accelerated partial breast radiation will coordinate with surgeon for MammoSite balloon placement as well as brachytherapy treatment planning.  I would like to take this opportunity for allowing me to participate in the care of your patient.Armstead Peaks., MD

## 2015-10-28 NOTE — Progress Notes (Signed)
Verbal and written education reviewed with patient and husband on mammosite radiation and external beam radiation.  Both verbalized understanding.  All questions answered to their satisfaction.  Approximately 20 minutes spent on education.

## 2015-11-02 ENCOUNTER — Telehealth: Payer: Self-pay | Admitting: *Deleted

## 2015-11-02 ENCOUNTER — Telehealth: Payer: Self-pay | Admitting: General Surgery

## 2015-11-02 NOTE — Telephone Encounter (Signed)
Patient is calling you back, she is [place] the beach and didn't have her phone turned on.

## 2015-11-02 NOTE — Telephone Encounter (Signed)
Pt has decided to get a consultation from oncologist at [facility]. To date she has completed lumpectomy, SN biopsy. T1c,N0,M0 She is ok with radiation and subsequent antihormonal therapy but is uncertain about chemo. If she does decide on chemo she will benefit with Mammoprint. All of this discussed fully with her.

## 2015-11-03 DIAGNOSIS — C50411 Malignant neoplasm of upper-outer quadrant of right female breast: Secondary | ICD-10-CM | POA: Insufficient documentation

## 2016-08-30 ENCOUNTER — Encounter: Payer: Self-pay | Admitting: *Deleted

## 2016-08-30 ENCOUNTER — Ambulatory Visit
Admission: EM | Admit: 2016-08-30 | Discharge: 2016-08-30 | Disposition: A | Discharge status: Home / Self Care | Payer: No Typology Code available for payment source | Attending: Family Medicine | Admitting: Family Medicine

## 2016-08-30 DIAGNOSIS — J069 Acute upper respiratory infection, unspecified: Secondary | ICD-10-CM

## 2016-08-30 DIAGNOSIS — J0110 Acute frontal sinusitis, unspecified: Secondary | ICD-10-CM

## 2016-08-30 MED ORDER — HYDROCOD POLST-CPM POLST ER 10-8 MG/5ML PO SUER: 5.0000 mL | Freq: Every evening | ORAL | 0 refills | Status: DC | PRN

## 2016-08-30 MED ORDER — AMOXICILLIN-POT CLAVULANATE 875-125 MG PO TABS: 1.0000 | ORAL_TABLET | Freq: Two times a day (BID) | ORAL | 0 refills | Status: DC

## 2016-08-30 MED ORDER — BENZONATATE 100 MG PO CAPS: 100.0000 mg | ORAL_CAPSULE | Freq: Three times a day (TID) | ORAL | 0 refills | Status: DC | PRN

## 2016-08-30 NOTE — Discharge Instructions (Signed)
Take medication as prescribed. Rest. Drink plenty of fluids.  ° °Follow up with your primary care physician this week as needed. Return to Urgent care for new or worsening concerns.  ° °

## 2016-08-30 NOTE — ED Provider Notes (Signed)
[facility] ____________________________________________  Time seen: Approximately 11:37 AM  I have reviewed the triage vital signs and the nursing notes.   HISTORY  Chief Complaint Otalgia and Facial Pain   HPI Colleen Price is a 61 y.o. female presents with a complaint of one week of runny nose, nasal congestion, sinus pressure, specifically nasal drainage and intermittent cough. States occasional scratchy throat, denies for discomfort currently. Denies known fevers. Patient reports she has taken multiple over-the-counter cough and congestion agents with some improvement, no resolution. Reports continues to eat and drink well. Reports is continue to remain active. Denies known direct sick contacts. States thick drainage when blowing nose as well as postnasal drainage, states cough is primarily nonproductive. States cough does intermittently wake her up at night.  Denies pain at this time.  Denies chest pain, chest pain with deep breath, shortness of breath, abdominal pain, dysuria, extremity pain, extremity swelling or rash. Denies recent sickness. Denies recent antibiotic use. Denies cardiac history. Denies renal insufficiency. States occasional seasonal allergies.  Kirk Ruths., MD: PCP   Past Medical History:  Diagnosis Date  . Arthritis    KNEES  . Cancer ([place])   . Complication of anesthesia   . PONV (postoperative nausea and vomiting)    PT STATES SCOPALAMINE PATCH HELPED WITH HER LAST SURGERY    There are no active problems to display for this patient.   Past Surgical History:  Procedure Laterality Date  . ABDOMINAL HYSTERECTOMY  1990  . BREAST LUMPECTOMY WITH SENTINEL LYMPH NODE BIOPSY Right 10/14/2015   Procedure: BREAST LUMPECTOMY WITH SENTINEL LYMPH NODE BX;  Surgeon: Christene Lye, MD;  Location: [facility] ORS;  Service: General;  Laterality: Right;  . COLONOSCOPY    . KNEE ARTHROSCOPY       No current facility-administered medications  for this encounter.   Current Outpatient Prescriptions:  .  amoxicillin-clavulanate (AUGMENTIN) 875-125 MG tablet, Take 1 tablet by mouth every 12 (twelve) hours., Disp: 20 tablet, Rfl: 0 .  benzonatate (TESSALON PERLES) 100 MG capsule, Take 1 capsule (100 mg total) by mouth 3 (three) times daily as needed for cough., Disp: 15 capsule, Rfl: 0 .  chlorpheniramine-HYDROcodone (TUSSIONEX PENNKINETIC ER) 10-8 MG/5ML SUER, Take 5 mLs by mouth at bedtime as needed for cough. do not drive or operate machinery while taking as can cause drowsiness., Disp: 75 mL, Rfl: 0  Allergies Conjugated estrogens and Latex  History reviewed. No pertinent family history.  Social History Social History  Substance Use Topics  . Smoking status: Never Smoker  . Smokeless tobacco: Never Used  . Alcohol use No    Review of Systems Constitutional: No fever/chills Eyes: No visual changes. ENT: As above. Cardiovascular: Denies chest pain. Respiratory: Denies shortness of breath. Gastrointestinal: No abdominal pain.  No nausea, no vomiting.  No diarrhea.  No constipation. Genitourinary: Negative for dysuria. Musculoskeletal: Negative for back pain. Skin: Negative for rash. Neurological: Negative for headaches, focal weakness or numbness.  10-point ROS otherwise negative.  ____________________________________________   PHYSICAL EXAM:  VITAL SIGNS: ED Triage Vitals  Enc Vitals Group     BP 08/30/16 1031 127/62     Pulse Rate 08/30/16 1031 (!) 57     Resp 08/30/16 1031 16     Temp 08/30/16 1031 97.9 F (36.6 C)     Temp Source 08/30/16 1031 Oral     SpO2 08/30/16 1031 100 %     Weight 08/30/16 1034 149 lb (67.6 kg)     Height  08/30/16 1034 5\' 8"  (1.727 m)     Head Circumference --      Peak Flow --      Pain Score --      Pain Loc --      Pain Edu? --      Excl. in [place]? --      Constitutional: Alert and oriented. Well appearing and in no acute distress. Eyes: Conjunctivae are normal. PERRL.  EOMI. Head: Atraumatic.Mild tenderness to palpation bilateral maxillary sinuses. No frontal sinus tenderness to palpation. No swelling. No erythema.   Ears: no erythema, normal TMs bilaterally.   Nose: nasal congestion with bilateral nasal turbinate erythema and edema.   Mouth/Throat: Mucous membranes are moist.  Oropharynx non-erythematous.No tonsillar swelling or exudate.  Neck: No stridor.  No cervical spine tenderness to palpation. Hematological/Lymphatic/Immunilogical: No cervical lymphadenopathy. Cardiovascular: Normal rate, regular rhythm. Grossly normal heart sounds.  Good peripheral circulation. Respiratory: Normal respiratory effort.  No retractions. No wheezes, rales or rhonchi. Good air movement.  Gastrointestinal: Soft and nontender.  Musculoskeletal: Ambulatory with steady gait. No cervical, thoracic or lumbar tenderness to palpation.  Neurologic:  Normal speech and language. No gait instability. Skin:  Skin is warm, dry and intact. No rash noted. Psychiatric: Mood and affect are normal. Speech and behavior are normal.  ___________________________________________   LABS (all labs ordered are listed, but only abnormal results are displayed)  Labs Reviewed - No data to display  PROCEDURES Procedures    INITIAL IMPRESSION / ASSESSMENT AND PLAN / ED COURSE  Pertinent labs & imaging results that were available during my care of the patient were reviewed by me and considered in my medical decision making (see chart for details).  Well appearing patient, no acute distress. Discussed with patient suspect upper respiratory infection with sinusitis, discussed viral component and use of antibiotics. Discussed with patient will treat with tessalon perles and prn Tussionex as needed with home antihistamine. Discussed with patient use of Augmentin for continued complaints past 2-3 more days, and discussed reevaluation for any change or worsening concerns. Patient verbalized  understanding to this plan, stating she does not want to take the antibiotic if not needed. Discussed indication, risks and benefits of medications with patient.  Discussed follow up with Primary care physician this week. Discussed follow up and return parameters including no resolution or any worsening concerns. Patient verbalized understanding and agreed to plan.   ____________________________________________   FINAL CLINICAL IMPRESSION(S) / ED DIAGNOSES  Final diagnoses:  Acute frontal sinusitis, recurrence not specified  Upper respiratory tract infection, unspecified type     Discharge Medication List as of 08/30/2016 11:50 AM    START taking these medications   Details  amoxicillin-clavulanate (AUGMENTIN) 875-125 MG tablet Take 1 tablet by mouth every 12 (twelve) hours., Starting Wed 08/30/2016, Normal    benzonatate (TESSALON PERLES) 100 MG capsule Take 1 capsule (100 mg total) by mouth 3 (three) times daily as needed for cough., Starting Wed 08/30/2016, Normal    chlorpheniramine-HYDROcodone (TUSSIONEX PENNKINETIC ER) 10-8 MG/5ML SUER Take 5 mLs by mouth at bedtime as needed for cough. do not drive or operate machinery while taking as can cause drowsiness., Starting Wed 08/30/2016, Print        Note: This dictation was prepared with Dragon dictation along with smaller phrase technology. Any transcriptional errors that result from this process are unintentional.         Marylene Land, NP 08/30/16 2114

## 2016-08-30 NOTE — ED Triage Notes (Signed)
Onset of sore throat with fever over a week ago which progressed to cough. Those symptoms have resolved and pt now has facial pain, head congestion, and ear pain. OTC meds no longer helping.

## 2016-09-01 IMAGING — MR MR BREAST BILAT WO/W CM
6 of 9 series · 26 of 48 positions shown · IV contrast (multihance)
Comparison: Previous exam(s).

CLINICAL DATA: Newly diagnosed right breast carcinoma. Evaluate
extent of disease.

LABS:  Previously drawn.  GFR 79.
EXAM:
BILATERAL BREAST MRI WITH AND WITHOUT CONTRAST
TECHNIQUE: Multiplanar, multisequence MR images of both breasts were obtained
prior to and following the intravenous administration of 14 ml of
MultiHance.

[Series 3: fl3d pre no · axial · non-contrast · 1.0mm · 0.80mm/px · z∈[-69,+106]mm · 5 of 176 slices shown]
[im 1/176]
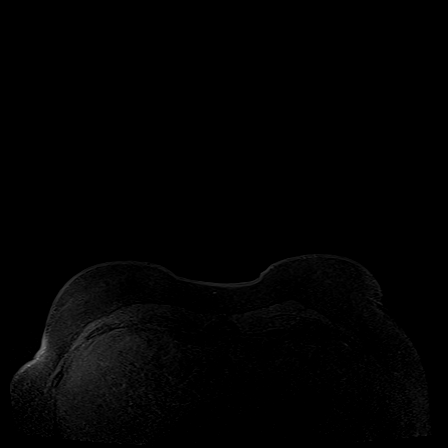
[im 44/176]
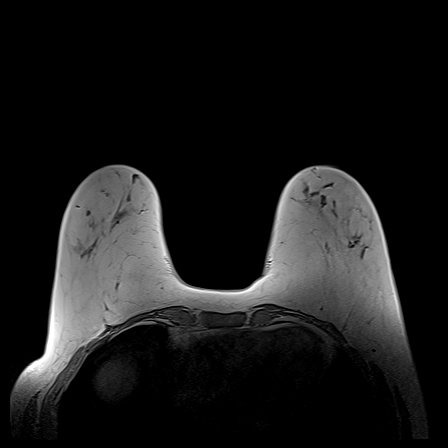
[im 88/176]
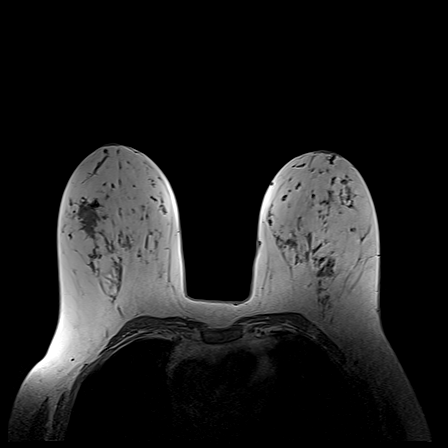
[im 132/176]
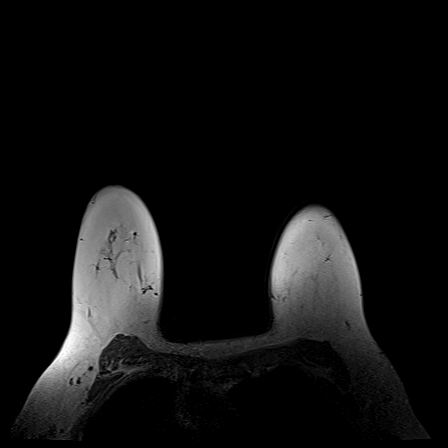
[im 176/176]
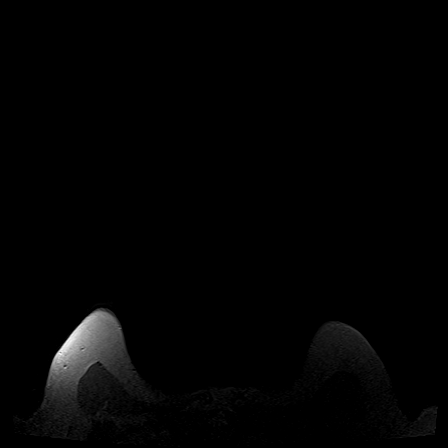

[Series 4: STIR · axial · 3.0mm · 0.94mm/px · 1 of 62 slices shown]
[im 1/62]
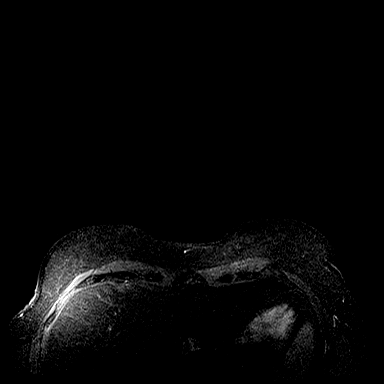

[Series 5: axial pre fs · axial · non-contrast · 1.0mm · 0.80mm/px · z∈[-69,+106]mm · 6 of 176 slices shown]
[im 1/176]
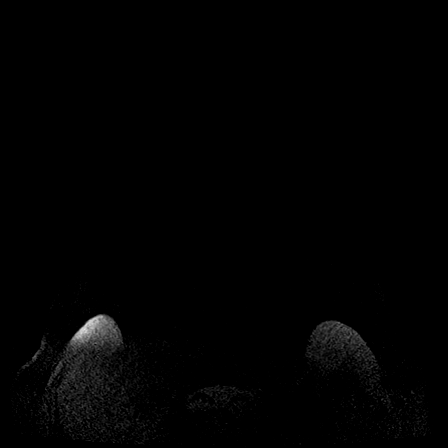
[im 36/176]
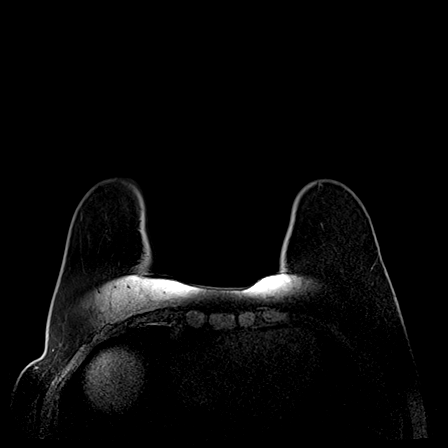
[im 71/176]
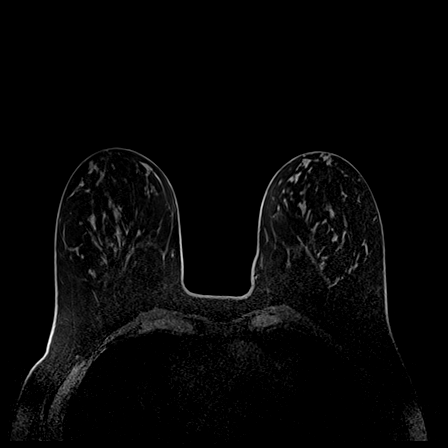
[im 106/176]
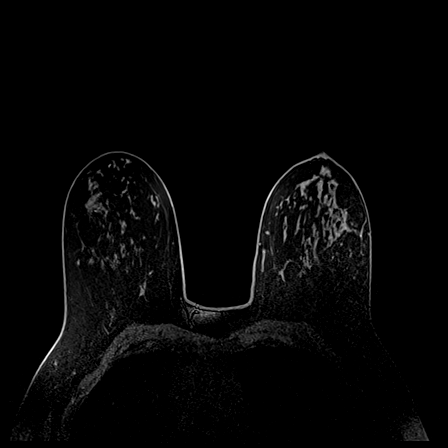
[im 141/176]
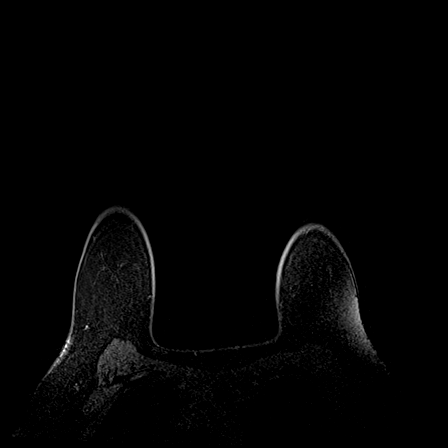
[im 176/176]
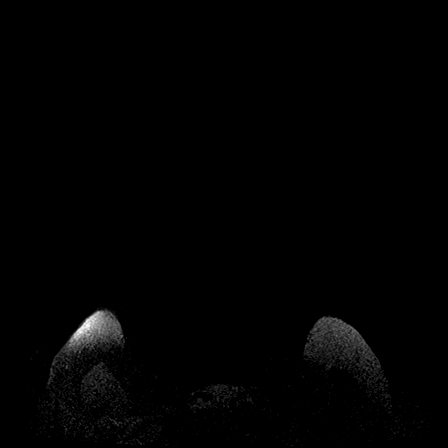

[Series 6: axial post 20 · axial · 1.0mm · 0.80mm/px · z∈[-69,+106]mm · 6 of 176 slices shown (1 of 2)]
[im 1/176]
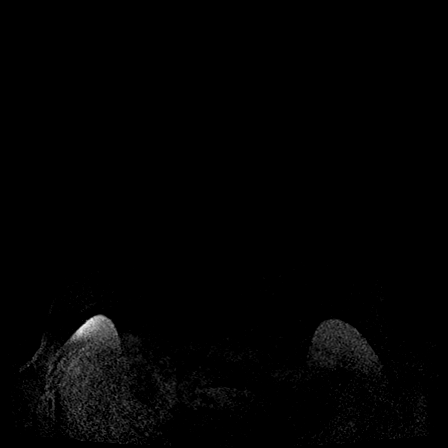
[im 36/176]
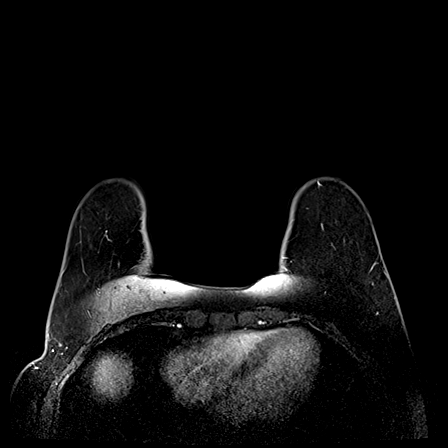
[im 71/176]
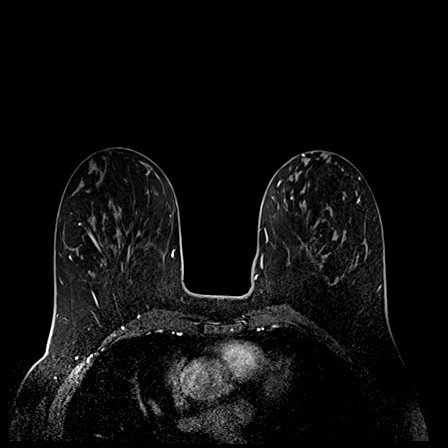
[im 106/176]
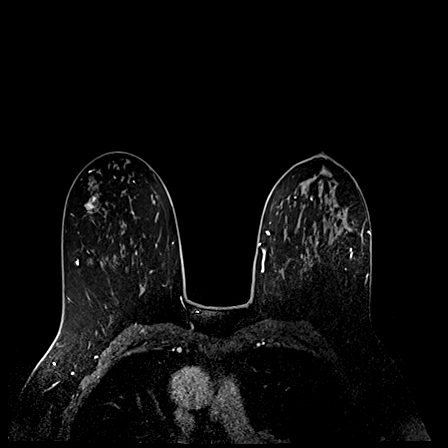
[im 141/176]
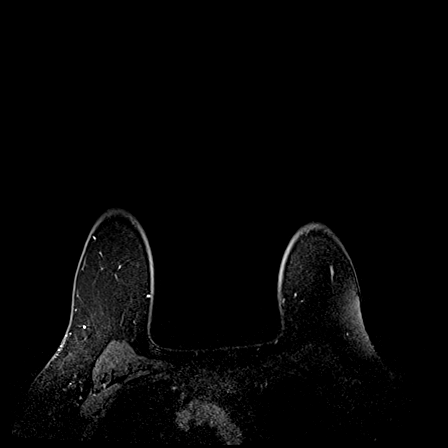
[im 176/176]
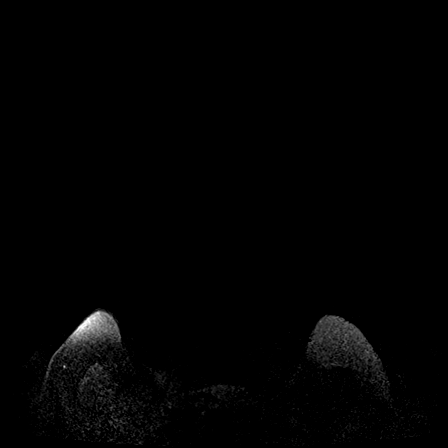

[Series 7: axial post 20 · axial · 1.0mm · 0.80mm/px · z∈[-69,+106]mm · 6 of 176 slices shown (2 of 2)]
[im 1/176]
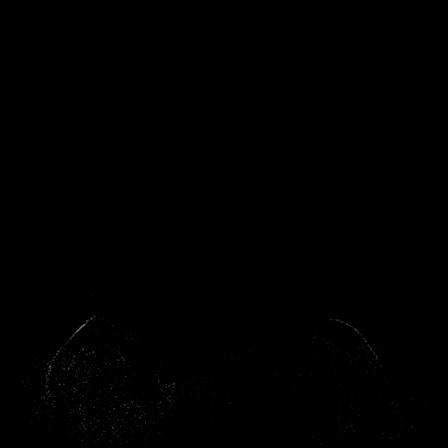
[im 36/176]
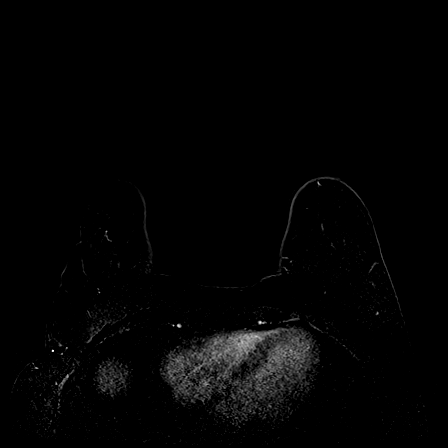
[im 71/176]
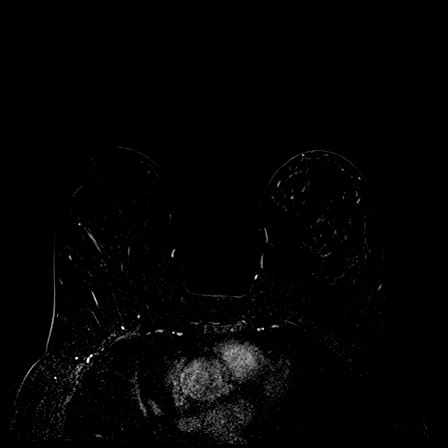
[im 106/176]
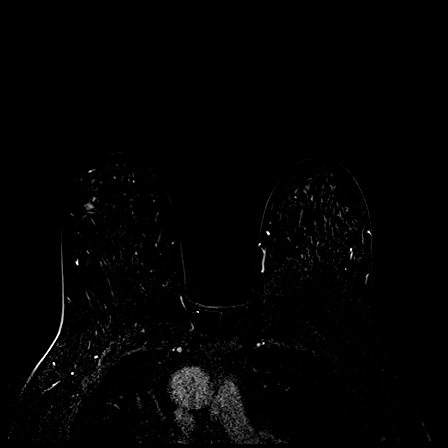
[im 141/176]
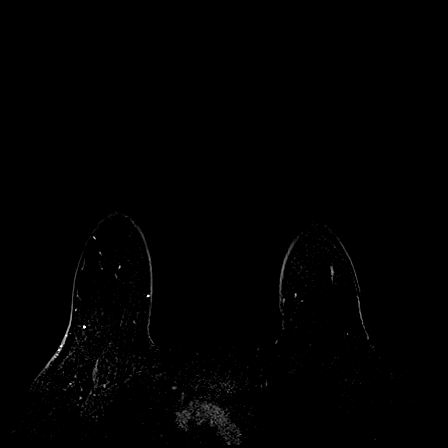
[im 176/176]
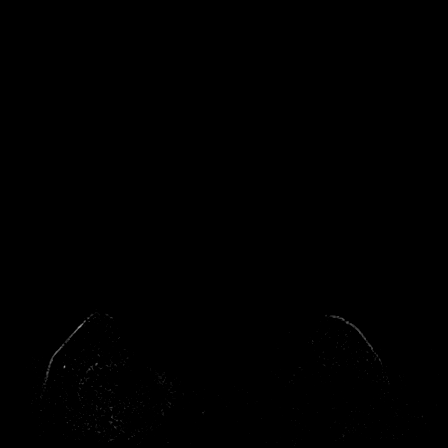

[Series 9: axial post 3 · axial · 1.0mm · 0.80mm/px · z∈[-69,-34]mm · 2 of 176 slices shown]
[im 1/176]
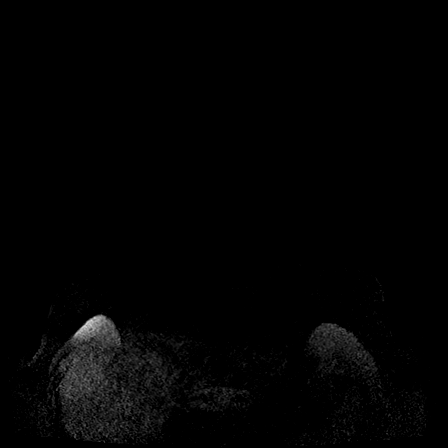
[im 36/176]
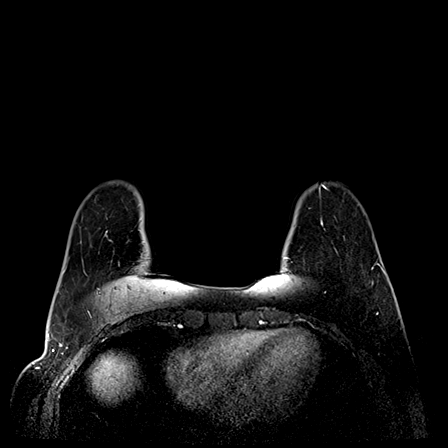

[26 of 48 positions shown; findings below may reference images not displayed]

THREE-DIMENSIONAL MR IMAGE RENDERING ON INDEPENDENT WORKSTATION:

Three-dimensional MR images were rendered by post-processing of the
original MR data on an independent workstation. The
three-dimensional MR images were interpreted, and findings are
reported in the following complete MRI report for this study. Three
dimensional images were evaluated at the independent [employer]
workstation
FINDINGS: Breast composition: b. Scattered fibroglandular tissue.

Background parenchymal enhancement: Mild

Right breast: There is an irregular enhancing mass in the right
breast, upper outer quadrant, middle depth, measuring 20 x 14 x 15
mm. This represents the known right breast carcinoma, and is
associated with susceptibility artifact from the biopsy clip. There
are no other areas of abnormal enhancement in the right breast.

Left breast: No mass or abnormal enhancement.

Lymph nodes: No abnormal appearing lymph nodes.

Ancillary findings:  None.
IMPRESSION: 1. 2 cm previously biopsied carcinoma in the upper outer right
breast.
2. No other evidence of breast carcinoma. Specifically, no other
areas of abnormal enhancement to suggest additional areas of breast
carcinoma in the right breast. No evidence of contralateral breast
carcinoma. No evidence of metastatic adenopathy.

RECOMMENDATION:
Treatment as planned for the known right breast carcinoma.

BI-RADS CATEGORY  6: Known biopsy-proven malignancy.

## 2016-10-18 LAB — HM MAMMOGRAPHY

## 2017-09-04 ENCOUNTER — Telehealth: Payer: Self-pay | Admitting: General Surgery

## 2017-09-04 NOTE — Telephone Encounter (Signed)
OK 

## 2017-09-04 NOTE — Telephone Encounter (Signed)
Patient called today to see if she could come back here for care & to have her mammograms.She had a rt br complete lumpectomy with sn biopsy done on 10-14-15 by Dr Jamal Collin.She went to [facility] for Radiation with no chemo. She had her mammo last year there order by her Radiation Oncologist(Dr Lyndel Safe).May we schedule her mammo this year & an appointment with you?

## 2017-09-05 NOTE — Telephone Encounter (Signed)
I spoke with the patient and this year she is going to go ahead and stay with [facility] system for mammogram and follow up. She will call us if she wants to change this next year and come and see Dr Bary Castilla. She is self pay and she is trying to be cost effective.

## 2018-01-10 ENCOUNTER — Encounter: Payer: Self-pay | Admitting: *Deleted

## 2018-01-10 ENCOUNTER — Other Ambulatory Visit: Payer: Self-pay

## 2018-01-18 NOTE — Discharge Instructions (Signed)

## 2018-01-23 ENCOUNTER — Ambulatory Visit
Admission: RE | Admit: 2018-01-23 | Discharge: 2018-01-23 | Disposition: A | Discharge status: Home / Self Care | Payer: Self-pay | Source: Ambulatory Visit | Attending: Ophthalmology | Admitting: Ophthalmology

## 2018-01-23 ENCOUNTER — Ambulatory Visit: Payer: Self-pay | Admitting: Anesthesiology

## 2018-01-23 ENCOUNTER — Encounter
Admission: RE | Disposition: A | Discharge status: Home / Self Care | Payer: Self-pay | Source: Ambulatory Visit | Attending: Ophthalmology

## 2018-01-23 DIAGNOSIS — H2511 Age-related nuclear cataract, right eye: Secondary | ICD-10-CM | POA: Insufficient documentation

## 2018-01-23 DIAGNOSIS — Z803 Family history of malignant neoplasm of breast: Secondary | ICD-10-CM | POA: Insufficient documentation

## 2018-01-23 HISTORY — PX: CATARACT EXTRACTION W/PHACO: SHX586

## 2018-01-23 SURGERY — PHACOEMULSIFICATION, CATARACT, WITH IOL INSERTION
Anesthesia: Monitor Anesthesia Care | Site: Eye | Laterality: Right | Wound class: "Clean "

## 2018-01-23 MED ORDER — CEFUROXIME OPHTHALMIC INJECTION 1 MG/0.1 ML
INJECTION | OPHTHALMIC | Status: DC | PRN
  Administered 2018-01-23: 0.1 mL via INTRACAMERAL

## 2018-01-23 MED ORDER — LIDOCAINE HCL (PF) 2 % IJ SOLN
INTRAMUSCULAR | Status: DC | PRN
  Administered 2018-01-23: 1 mL

## 2018-01-23 MED ORDER — BRIMONIDINE TARTRATE-TIMOLOL 0.2-0.5 % OP SOLN
OPHTHALMIC | Status: DC | PRN
  Administered 2018-01-23: 1 [drp] via OPHTHALMIC

## 2018-01-23 MED ORDER — MIDAZOLAM HCL 2 MG/2ML IJ SOLN
INTRAMUSCULAR | Status: DC | PRN
  Administered 2018-01-23 (×2): 1 mg via INTRAVENOUS

## 2018-01-23 MED ORDER — MOXIFLOXACIN HCL 0.5 % OP SOLN
1.0000 [drp] | OPHTHALMIC | Status: DC | PRN
  Administered 2018-01-23 (×3): 1 [drp] via OPHTHALMIC

## 2018-01-23 MED ORDER — EPINEPHRINE PF 1 MG/ML IJ SOLN
INTRAOCULAR | Status: DC | PRN
  Administered 2018-01-23: 67 mL via OPHTHALMIC

## 2018-01-23 MED ORDER — FENTANYL CITRATE (PF) 100 MCG/2ML IJ SOLN
INTRAMUSCULAR | Status: DC | PRN
  Administered 2018-01-23 (×2): 25 ug via INTRAVENOUS

## 2018-01-23 MED ORDER — LACTATED RINGERS IV SOLN: INTRAVENOUS | Status: DC

## 2018-01-23 MED ORDER — ARMC OPHTHALMIC DILATING DROPS
1.0000 "application " | OPHTHALMIC | Status: DC | PRN
  Administered 2018-01-23 (×3): 1 via OPHTHALMIC

## 2018-01-23 MED ORDER — ONDANSETRON HCL 4 MG/2ML IJ SOLN
INTRAMUSCULAR | Status: DC | PRN
  Administered 2018-01-23: 4 mg via INTRAVENOUS

## 2018-01-23 MED ORDER — NA HYALUR & NA CHOND-NA HYALUR 0.4-0.35 ML IO KIT
PACK | INTRAOCULAR | Status: DC | PRN
  Administered 2018-01-23: 1 mL via INTRAOCULAR

## 2018-01-23 SURGICAL SUPPLY — 20 items
CANNULA ANT/CHMB 27G (MISCELLANEOUS) ×1 IMPLANT
CANNULA ANT/CHMB 27GA (MISCELLANEOUS) ×3 IMPLANT
GLOVE SURG LX 7.5 STRW (GLOVE) ×2
GLOVE SURG LX STRL 7.5 STRW (GLOVE) ×1 IMPLANT
GLOVE SURG TRIUMPH 8.0 PF LTX (GLOVE) ×3 IMPLANT
GOWN STRL REUS W/ TWL LRG LVL3 (GOWN DISPOSABLE) ×2 IMPLANT
GOWN STRL REUS W/TWL LRG LVL3 (GOWN DISPOSABLE) ×4
LENS IOL TECNIS ITEC 21.5 (Intraocular Lens) ×2 IMPLANT
MARKER SKIN DUAL TIP RULER LAB (MISCELLANEOUS) ×3 IMPLANT
NDL FILTER BLUNT 18X1 1/2 (NEEDLE) ×1 IMPLANT
NEEDLE FILTER BLUNT 18X 1/2SAF (NEEDLE) ×2
NEEDLE FILTER BLUNT 18X1 1/2 (NEEDLE) ×1 IMPLANT
PACK CATARACT BRASINGTON (MISCELLANEOUS) ×3 IMPLANT
PACK EYE AFTER SURG (MISCELLANEOUS) ×3 IMPLANT
PACK OPTHALMIC (MISCELLANEOUS) ×3 IMPLANT
SYR 3ML LL SCALE MARK (SYRINGE) ×3 IMPLANT
SYR 5ML LL (SYRINGE) ×3 IMPLANT
SYR TB 1ML LUER SLIP (SYRINGE) ×3 IMPLANT
WATER STERILE IRR 500ML POUR (IV SOLUTION) ×3 IMPLANT
WIPE NON LINTING 3.25X3.25 (MISCELLANEOUS) ×3 IMPLANT

## 2018-01-23 NOTE — Anesthesia Postprocedure Evaluation (Signed)
Anesthesia Post Note  Patient: Colleen Price  Procedure(s) Performed: CATARACT EXTRACTION PHACO AND INTRAOCULAR LENS PLACEMENT (IOC)  RIGHT (Right Eye)  Patient location during evaluation: PACU Anesthesia Type: MAC Level of consciousness: awake Pain management: pain level controlled Vital Signs Assessment: post-procedure vital signs reviewed and stable Respiratory status: spontaneous breathing Cardiovascular status: blood pressure returned to baseline Postop Assessment: no headache Anesthetic complications: no    Lavonna Monarch

## 2018-01-23 NOTE — Anesthesia Preprocedure Evaluation (Signed)
Anesthesia Evaluation  Patient identified by MRN, date of birth, ID band Patient awake    Reviewed: Allergy & Precautions, NPO status , Patient's Chart, lab work & pertinent test results, reviewed documented beta blocker date and time   History of Anesthesia Complications (+) PONV and history of anesthetic complications  Airway Mallampati: II  TM Distance: >3 FB Neck ROM: Full    Dental no notable dental hx.    Pulmonary neg pulmonary ROS,    Pulmonary exam normal breath sounds clear to auscultation       Cardiovascular negative cardio ROS Normal cardiovascular exam Rate:Normal     Neuro/Psych negative neurological ROS  negative psych ROS   GI/Hepatic negative GI ROS, Neg liver ROS,   Endo/Other  negative endocrine ROS  Renal/GU negative Renal ROS     Musculoskeletal  (+) Arthritis ,   Abdominal Normal abdominal exam  (+)   Peds  Hematology Breast cancer   Anesthesia Other Findings   Reproductive/Obstetrics                             Anesthesia Physical Anesthesia Plan  ASA: II  Anesthesia Plan: MAC   Post-op Pain Management:    Induction: Intravenous  PONV Risk Score and Plan:   Airway Management Planned: Natural Airway  Additional Equipment: None  Intra-op Plan:   Post-operative Plan:   Informed Consent: I have reviewed the patients History and Physical, chart, labs and discussed the procedure including the risks, benefits and alternatives for the proposed anesthesia with the patient or authorized representative who has indicated his/her understanding and acceptance.     Plan Discussed with: CRNA, Anesthesiologist and Surgeon  Anesthesia Plan Comments:         Anesthesia Quick Evaluation

## 2018-01-23 NOTE — H&P (Signed)
The History and Physical notes are on paper, have been signed, and are to be scanned. The patient remains stable and unchanged from the H&P.   Previous H&P reviewed, patient examined, and there are no changes.  Colleen Price 01/23/2018 7:27 AM

## 2018-01-23 NOTE — Transfer of Care (Signed)
Immediate Anesthesia Transfer of Care Note  Patient: Colleen Price  Procedure(s) Performed: CATARACT EXTRACTION PHACO AND INTRAOCULAR LENS PLACEMENT (I[facility])  RIGHT (Right Eye)  Patient Location: PACU  Anesthesia Type: MAC  Level of Consciousness: awake, alert  and patient cooperative  Airway and Oxygen Therapy: Patient Spontanous Breathing and Patient connected to supplemental oxygen  Post-op Assessment: Post-op Vital signs reviewed, Patient's Cardiovascular Status Stable, Respiratory Function Stable, Patent Airway and No signs of Nausea or vomiting  Post-op Vital Signs: Reviewed and stable  Complications: No apparent anesthesia complications

## 2018-01-23 NOTE — Op Note (Signed)
LOCATION:  [place]   PREOPERATIVE DIAGNOSIS:    Nuclear sclerotic cataract right eye. H25.11   POSTOPERATIVE DIAGNOSIS:  Nuclear sclerotic cataract right eye.     PROCEDURE:  Phacoemusification with posterior chamber intraocular lens placement of the right eye   LENS:   Implant Name Type Inv. Item Serial No. Manufacturer Lot No. LRB No. Used  LENS IOL DIOP 21.5 - [id number] Intraocular Lens LENS IOL DIOP 21.5 [id number] AMO  Right 1        ULTRASOUND TIME: 11 % of 1 minutes, 6 seconds.  CDE 8.1   SURGEON:  Wyonia Hough, MD   ANESTHESIA:  Topical with tetracaine drops and 2% Xylocaine jelly, augmented with 1% preservative-free intracameral lidocaine.    COMPLICATIONS:  None.   DESCRIPTION OF PROCEDURE:  The patient was identified in the holding room and transported to the operating room and placed in the supine position under the operating microscope.  The right eye was identified as the operative eye and it was prepped and draped in the usual sterile ophthalmic fashion.   A 1 millimeter clear-corneal paracentesis was made at the 12:00 position.  0.5 ml of preservative-free 1% lidocaine was injected into the anterior chamber. The anterior chamber was filled with Viscoat viscoelastic.  A 2.4 millimeter keratome was used to make a near-clear corneal incision at the 9:00 position.  A curvilinear capsulorrhexis was made with a cystotome and capsulorrhexis forceps.  Balanced salt solution was used to hydrodissect and hydrodelineate the nucleus.   Phacoemulsification was then used in stop and chop fashion to remove the lens nucleus and epinucleus.  The remaining cortex was then removed using the irrigation and aspiration handpiece. Provisc was then placed into the capsular bag to distend it for lens placement.  A lens was then injected into the capsular bag.  The remaining viscoelastic was aspirated.   Wounds were hydrated with balanced salt solution.  The anterior  chamber was inflated to a physiologic pressure with balanced salt solution.  No wound leaks were noted. Cefuroxime 0.1 ml of a 10mg /ml solution was injected into the anterior chamber for a dose of 1 mg of intracameral antibiotic at the completion of the case.   Timolol and Brimonidine drops were applied to the eye.  The patient was taken to the recovery room in stable condition without complications of anesthesia or surgery.   Kaydon Husby 01/23/2018, 7:52 AM

## 2018-01-24 ENCOUNTER — Encounter: Payer: Self-pay | Admitting: Ophthalmology

## 2018-02-06 NOTE — Discharge Instructions (Signed)

## 2018-02-07 ENCOUNTER — Encounter: Payer: Self-pay | Admitting: *Deleted

## 2018-02-07 ENCOUNTER — Other Ambulatory Visit: Payer: Self-pay

## 2018-02-13 ENCOUNTER — Encounter
Admission: RE | Disposition: A | Discharge status: Home / Self Care | Payer: Self-pay | Source: Ambulatory Visit | Attending: Ophthalmology

## 2018-02-13 ENCOUNTER — Ambulatory Visit: Payer: Self-pay | Admitting: Anesthesiology

## 2018-02-13 ENCOUNTER — Ambulatory Visit
Admission: RE | Admit: 2018-02-13 | Discharge: 2018-02-13 | Disposition: A | Discharge status: Home / Self Care | Payer: Self-pay | Source: Ambulatory Visit | Attending: Ophthalmology | Admitting: Ophthalmology

## 2018-02-13 DIAGNOSIS — H2512 Age-related nuclear cataract, left eye: Secondary | ICD-10-CM | POA: Insufficient documentation

## 2018-02-13 DIAGNOSIS — Z853 Personal history of malignant neoplasm of breast: Secondary | ICD-10-CM | POA: Insufficient documentation

## 2018-02-13 HISTORY — PX: CATARACT EXTRACTION W/PHACO: SHX586

## 2018-02-13 SURGERY — PHACOEMULSIFICATION, CATARACT, WITH IOL INSERTION
Anesthesia: Monitor Anesthesia Care | Site: Eye | Laterality: Left | Wound class: "Clean "

## 2018-02-13 MED ORDER — ARMC OPHTHALMIC DILATING DROPS
1.0000 "application " | OPHTHALMIC | Status: DC | PRN
  Administered 2018-02-13 (×3): 1 via OPHTHALMIC

## 2018-02-13 MED ORDER — MIDAZOLAM HCL 2 MG/2ML IJ SOLN
INTRAMUSCULAR | Status: DC | PRN
  Administered 2018-02-13: 2 mg via INTRAVENOUS

## 2018-02-13 MED ORDER — FENTANYL CITRATE (PF) 100 MCG/2ML IJ SOLN
INTRAMUSCULAR | Status: DC | PRN
  Administered 2018-02-13: 100 ug via INTRAVENOUS

## 2018-02-13 MED ORDER — NA HYALUR & NA CHOND-NA HYALUR 0.4-0.35 ML IO KIT
PACK | INTRAOCULAR | Status: DC | PRN
  Administered 2018-02-13: 1 mL via INTRAOCULAR

## 2018-02-13 MED ORDER — ACETAMINOPHEN 325 MG PO TABS: 325.0000 mg | ORAL_TABLET | ORAL | Status: DC | PRN

## 2018-02-13 MED ORDER — MOXIFLOXACIN HCL 0.5 % OP SOLN
1.0000 [drp] | OPHTHALMIC | Status: DC | PRN
  Administered 2018-02-13 (×3): 1 [drp] via OPHTHALMIC

## 2018-02-13 MED ORDER — EPINEPHRINE PF 1 MG/ML IJ SOLN
INTRAOCULAR | Status: DC | PRN
  Administered 2018-02-13: 57 mL via OPHTHALMIC

## 2018-02-13 MED ORDER — CEFUROXIME OPHTHALMIC INJECTION 1 MG/0.1 ML
INJECTION | OPHTHALMIC | Status: DC | PRN
  Administered 2018-02-13: 0.1 mL via INTRACAMERAL

## 2018-02-13 MED ORDER — BRIMONIDINE TARTRATE-TIMOLOL 0.2-0.5 % OP SOLN
OPHTHALMIC | Status: DC | PRN
  Administered 2018-02-13: 1 [drp] via OPHTHALMIC

## 2018-02-13 MED ORDER — ACETAMINOPHEN 160 MG/5ML PO SOLN: 325.0000 mg | ORAL | Status: DC | PRN

## 2018-02-13 MED ORDER — LIDOCAINE HCL (PF) 2 % IJ SOLN
INTRAOCULAR | Status: DC | PRN
  Administered 2018-02-13: 1 mL

## 2018-02-13 SURGICAL SUPPLY — 20 items
CANNULA ANT/CHMB 27G (MISCELLANEOUS) ×1 IMPLANT
CANNULA ANT/CHMB 27GA (MISCELLANEOUS) ×3 IMPLANT
GLOVE SURG LX 7.5 STRW (GLOVE) ×2
GLOVE SURG LX STRL 7.5 STRW (GLOVE) ×1 IMPLANT
GLOVE SURG TRIUMPH 8.0 PF LTX (GLOVE) ×3 IMPLANT
GOWN STRL REUS W/ TWL LRG LVL3 (GOWN DISPOSABLE) ×2 IMPLANT
GOWN STRL REUS W/TWL LRG LVL3 (GOWN DISPOSABLE) ×4
LENS IOL TECNIS ITEC 21.5 (Intraocular Lens) ×2 IMPLANT
MARKER SKIN DUAL TIP RULER LAB (MISCELLANEOUS) ×3 IMPLANT
NDL FILTER BLUNT 18X1 1/2 (NEEDLE) ×1 IMPLANT
NEEDLE FILTER BLUNT 18X 1/2SAF (NEEDLE) ×2
NEEDLE FILTER BLUNT 18X1 1/2 (NEEDLE) ×1 IMPLANT
PACK CATARACT BRASINGTON (MISCELLANEOUS) ×3 IMPLANT
PACK EYE AFTER SURG (MISCELLANEOUS) ×3 IMPLANT
PACK OPTHALMIC (MISCELLANEOUS) ×3 IMPLANT
SYR 3ML LL SCALE MARK (SYRINGE) ×3 IMPLANT
SYR 5ML LL (SYRINGE) ×3 IMPLANT
SYR TB 1ML LUER SLIP (SYRINGE) ×3 IMPLANT
WATER STERILE IRR 500ML POUR (IV SOLUTION) ×3 IMPLANT
WIPE NON LINTING 3.25X3.25 (MISCELLANEOUS) ×3 IMPLANT

## 2018-02-13 NOTE — Anesthesia Preprocedure Evaluation (Signed)
Anesthesia Evaluation  Patient identified by MRN, date of birth, ID band Patient awake    Reviewed: Allergy & Precautions, NPO status , Patient's Chart, lab work & pertinent test results, reviewed documented beta blocker date and time   History of Anesthesia Complications (+) PONV and history of anesthetic complications  Airway Mallampati: II  TM Distance: >3 FB Neck ROM: Full    Dental no notable dental hx.    Pulmonary neg pulmonary ROS,    Pulmonary exam normal breath sounds clear to auscultation       Cardiovascular negative cardio ROS Normal cardiovascular exam Rate:Normal     Neuro/Psych negative neurological ROS  negative psych ROS   GI/Hepatic negative GI ROS, Neg liver ROS,   Endo/Other  negative endocrine ROS  Renal/GU negative Renal ROS     Musculoskeletal  (+) Arthritis ,   Abdominal Normal abdominal exam  (+)   Peds  Hematology Breast cancer   Anesthesia Other Findings   Reproductive/Obstetrics                             Anesthesia Physical Anesthesia Plan  ASA: II  Anesthesia Plan: MAC   Post-op Pain Management:    Induction: Intravenous  PONV Risk Score and Plan:   Airway Management Planned: Natural Airway  Additional Equipment: None  Intra-op Plan:   Post-operative Plan:   Informed Consent: I have reviewed the patients History and Physical, chart, labs and discussed the procedure including the risks, benefits and alternatives for the proposed anesthesia with the patient or authorized representative who has indicated his/her understanding and acceptance.     Plan Discussed with: CRNA, Anesthesiologist and Surgeon  Anesthesia Plan Comments:         Anesthesia Quick Evaluation  

## 2018-02-13 NOTE — H&P (Signed)
The History and Physical notes are on paper, have been signed, and are to be scanned. The patient remains stable and unchanged from the H&P.   Previous H&P reviewed, patient examined, and there are no changes.  Colleen Price 02/13/2018 8:35 AM

## 2018-02-13 NOTE — Op Note (Signed)
OPERATIVE NOTE  Colleen Price [id number] 02/13/2018   PREOPERATIVE DIAGNOSIS:  Nuclear sclerotic cataract left eye. H25.12   POSTOPERATIVE DIAGNOSIS:    Nuclear sclerotic cataract left eye.     PROCEDURE:  Phacoemusification with posterior chamber intraocular lens placement of the left eye   LENS:   Implant Name Type Inv. Item Serial No. Manufacturer Lot No. LRB No. Used  LENS IOL DIOP 21.5 - [id number] Intraocular Lens LENS IOL DIOP 21.5 [id number] AMO  Left 1        ULTRASOUND TIME: 18  % of 0 minutes 52 seconds, CDE 9.5  SURGEON:  Wyonia Hough, MD   ANESTHESIA:  Topical with tetracaine drops and 2% Xylocaine jelly, augmented with 1% preservative-free intracameral lidocaine.    COMPLICATIONS:  None.   DESCRIPTION OF PROCEDURE:  The patient was identified in the holding room and transported to the operating room and placed in the supine position under the operating microscope.  The left eye was identified as the operative eye and it was prepped and draped in the usual sterile ophthalmic fashion.   A 1 millimeter clear-corneal paracentesis was made at the 1:30 position.  0.5 ml of preservative-free 1% lidocaine was injected into the anterior chamber.  The anterior chamber was filled with Viscoat viscoelastic.  A 2.4 millimeter keratome was used to make a near-clear corneal incision at the 10:30 position.  .  A curvilinear capsulorrhexis was made with a cystotome and capsulorrhexis forceps.  Balanced salt solution was used to hydrodissect and hydrodelineate the nucleus.   Phacoemulsification was then used in stop and chop fashion to remove the lens nucleus and epinucleus.  The remaining cortex was then removed using the irrigation and aspiration handpiece. Provisc was then placed into the capsular bag to distend it for lens placement.  A lens was then injected into the capsular bag.  The remaining viscoelastic was aspirated.   Wounds were hydrated with balanced salt  solution.  The anterior chamber was inflated to a physiologic pressure with balanced salt solution.  No wound leaks were noted. Cefuroxime 0.1 ml of a 10mg /ml solution was injected into the anterior chamber for a dose of 1 mg of intracameral antibiotic at the completion of the case.   Timolol and Brimonidine drops were applied to the eye.  The patient was taken to the recovery room in stable condition without complications of anesthesia or surgery.  [name] 02/13/2018, 9:26 AM

## 2018-02-13 NOTE — Transfer of Care (Signed)
Immediate Anesthesia Transfer of Care Note  Patient: Colleen Price  Procedure(s) Performed: CATARACT EXTRACTION PHACO AND INTRAOCULAR LENS PLACEMENT (IOC) LEFT (Left Eye)  Patient Location: PACU  Anesthesia Type: MAC  Level of Consciousness: awake, alert  and patient cooperative  Airway and Oxygen Therapy: Patient Spontanous Breathing and Patient connected to supplemental oxygen  Post-op Assessment: Post-op Vital signs reviewed, Patient's Cardiovascular Status Stable, Respiratory Function Stable, Patent Airway and No signs of Nausea or vomiting  Post-op Vital Signs: Reviewed and stable  Complications: No apparent anesthesia complications

## 2018-02-13 NOTE — Anesthesia Postprocedure Evaluation (Signed)
Anesthesia Post Note  Patient: Colleen Price  Procedure(s) Performed: CATARACT EXTRACTION PHACO AND INTRAOCULAR LENS PLACEMENT ([place]) LEFT (Left Eye)  Patient location during evaluation: PACU Anesthesia Type: MAC Level of consciousness: awake and alert Pain management: pain level controlled Vital Signs Assessment: post-procedure vital signs reviewed and stable Respiratory status: spontaneous breathing, nonlabored ventilation, respiratory function stable and patient connected to nasal cannula oxygen Cardiovascular status: stable and blood pressure returned to baseline Postop Assessment: no apparent nausea or vomiting Anesthetic complications: no    Alisa Graff

## 2018-02-13 NOTE — Anesthesia Procedure Notes (Signed)
Procedure Name: MAC Performed by: Josalynn Johndrow, CRNA Pre-anesthesia Checklist: Patient identified, Emergency Drugs available, Suction available, Timeout performed and Patient being monitored Patient Re-evaluated:Patient Re-evaluated prior to induction Oxygen Delivery Method: Nasal cannula Placement Confirmation: positive ETCO2       

## 2018-02-14 ENCOUNTER — Encounter: Payer: Self-pay | Admitting: Ophthalmology

## 2018-10-26 ENCOUNTER — Ambulatory Visit (INDEPENDENT_AMBULATORY_CARE_PROVIDER_SITE_OTHER): Payer: Self-pay

## 2018-10-26 ENCOUNTER — Other Ambulatory Visit: Payer: Self-pay

## 2018-10-26 ENCOUNTER — Encounter: Payer: Self-pay | Admitting: Emergency Medicine

## 2018-10-26 ENCOUNTER — Ambulatory Visit
Admission: EM | Admit: 2018-10-26 | Discharge: 2018-10-26 | Disposition: A | Discharge status: Home / Self Care | Payer: Self-pay | Attending: Family Medicine | Admitting: Family Medicine

## 2018-10-26 DIAGNOSIS — R05 Cough: Secondary | ICD-10-CM

## 2018-10-26 DIAGNOSIS — M545 Low back pain: Secondary | ICD-10-CM

## 2018-10-26 DIAGNOSIS — R059 Cough, unspecified: Secondary | ICD-10-CM

## 2018-10-26 DIAGNOSIS — R51 Headache: Secondary | ICD-10-CM

## 2018-10-26 DIAGNOSIS — R0602 Shortness of breath: Secondary | ICD-10-CM

## 2018-10-26 LAB — TSH: TSH: 1.253 u[IU]/mL (ref 0.350–4.500)

## 2018-10-26 LAB — CBC WITH DIFFERENTIAL/PLATELET
Abs Immature Granulocytes: 0.03 10*3/uL (ref 0.00–0.07)
Basophils Absolute: 0.1 10*3/uL (ref 0.0–0.1)
Basophils Relative: 1 %
Eosinophils Absolute: 0.2 10*3/uL (ref 0.0–0.5)
Eosinophils Relative: 3 %
HCT: 40.7 % (ref 36.0–46.0)
Hemoglobin: 13.1 g/dL (ref 12.0–15.0)
Immature Granulocytes: 0 %
Lymphocytes Relative: 32 %
Lymphs Abs: 2.1 10*3/uL (ref 0.7–4.0)
MCH: 29.4 pg (ref 26.0–34.0)
MCHC: 32.2 g/dL (ref 30.0–36.0)
MCV: 91.3 fL (ref 80.0–100.0)
Monocytes Absolute: 0.6 10*3/uL (ref 0.1–1.0)
Monocytes Relative: 9 %
Neutro Abs: 3.8 10*3/uL (ref 1.7–7.7)
Neutrophils Relative %: 55 %
Platelets: 255 10*3/uL (ref 150–400)
RBC: 4.46 MIL/uL (ref 3.87–5.11)
RDW: 13 % (ref 11.5–15.5)
WBC: 6.7 10*3/uL (ref 4.0–10.5)
nRBC: 0 % (ref 0.0–0.2)

## 2018-10-26 LAB — COMPREHENSIVE METABOLIC PANEL
ALT: 20 U/L (ref 0–44)
AST: 20 U/L (ref 15–41)
Albumin: 4.1 g/dL (ref 3.5–5.0)
Alkaline Phosphatase: 69 U/L (ref 38–126)
Anion gap: 8 (ref 5–15)
BUN: 21 mg/dL (ref 8–23)
CO2: 25 mmol/L (ref 22–32)
Calcium: 9.5 mg/dL (ref 8.9–10.3)
Chloride: 105 mmol/L (ref 98–111)
Creatinine, Ser: 0.73 mg/dL (ref 0.44–1.00)
GFR calc Af Amer: >60 mL/min (ref >60)
GFR calc non Af Amer: >60 mL/min (ref >60)
Glucose, Bld: 96 mg/dL (ref 70–99)
Potassium: 4.4 mmol/L (ref 3.5–5.1)
Sodium: 138 mmol/L (ref 135–145)
Total Bilirubin: 0.8 mg/dL (ref 0.3–1.2)
Total Protein: 7.2 g/dL (ref 6.5–8.1)

## 2018-10-26 MED ORDER — AZITHROMYCIN 250 MG PO TABS: ORAL_TABLET | ORAL | 0 refills | Status: DC

## 2018-10-26 NOTE — Discharge Instructions (Signed)
Rest.  Fluids.  If you fail to improve or worsen, start the antibiotic.  Take care  Dr. Lacinda Axon

## 2018-10-26 NOTE — ED Triage Notes (Signed)
Pt c/o cough, chest heaviness, headache, lightheaded, and back pain. Cough started about 2 weeks ago. Denies fever.

## 2018-10-26 NOTE — ED Provider Notes (Signed)
[facility]    CSN: [id number] Arrival date & time: 10/26/18  1134   History   Chief Complaint Chief Complaint  Patient presents with  . Cough   HPI  63 year old female presents with multiple complaints.  Patient states that she has not been feeling well for the past 2 weeks.  She reports intermittent cough, chest tightness, shortness of breath.  Patient states that she had lightheadedness this morning.  She had thoracic back pain yesterday.  Slight headache this morning.  No fever.  Patient states that she just does not feel well.  Normal appetite.  No known exacerbating or relieving factors.  No reported sick contacts.  No other associated symptoms.  No other complaints.  PMH, Surgical Hx, Family Hx, Social History reviewed and updated as below.  Past Medical History:  Diagnosis Date  . Arthritis    KNEES  . Cancer ([place])   . Complication of anesthesia   . PONV (postoperative nausea and vomiting)    PT STATES SCOPALAMINE PATCH HELPED WITH HER LAST SURGERY   Past Surgical History:  Procedure Laterality Date  . ABDOMINAL HYSTERECTOMY  1990  . BREAST LUMPECTOMY WITH SENTINEL LYMPH NODE BIOPSY Right 10/14/2015   Procedure: BREAST LUMPECTOMY WITH SENTINEL LYMPH NODE BX;  Surgeon: Christene Lye, MD;  Location: [facility];  Service: General;  Laterality: Right;  . CATARACT EXTRACTION W/PHACO Right 01/23/2018   Procedure: CATARACT EXTRACTION PHACO AND INTRAOCULAR LENS PLACEMENT ([place])  RIGHT;  Surgeon: Leandrew Koyanagi, MD;  Location: [place];  Service: Ophthalmology;  Laterality: Right;  . CATARACT EXTRACTION W/PHACO Left 02/13/2018   Procedure: CATARACT EXTRACTION PHACO AND INTRAOCULAR LENS PLACEMENT ([place]) LEFT;  Surgeon: Leandrew Koyanagi, MD;  Location: [place];  Service: Ophthalmology;  Laterality: Left;  . COLONOSCOPY    . KNEE ARTHROSCOPY      OB History    Gravida  1   Para  1   Term  1   Preterm      AB      Living  1      SAB      TAB      Ectopic      Multiple      Live Births           Obstetric Comments  1st Menstrual Cycle:  17 1st Pregnancy:  25          Home Medications    Prior to Admission medications   Medication Sig Start Date End Date Taking? Authorizing Provider  azithromycin (ZITHROMAX) 250 MG tablet 2 tablets on day 1, then 1 tablet daily on days 2-5. 10/26/18   Coral Spikes, DO    Family History History reviewed. No pertinent family history.  Social History Social History   Tobacco Use  . Smoking status: Never Smoker  . Smokeless tobacco: Never Used  Substance Use Topics  . Alcohol use: No    Alcohol/week: 0.0 standard drinks  . Drug use: No     Allergies   Adhesive [tape] and Conjugated estrogens   Review of Systems Review of Systems  Constitutional: Negative for appetite change and fever.  Respiratory: Positive for cough, chest tightness and shortness of breath.   Musculoskeletal: Positive for back pain.  Neurological: Positive for light-headedness and headaches.   Physical Exam Triage Vital Signs ED Triage Vitals  Enc Vitals Group     BP 10/26/18 1149 130/78     Pulse Rate 10/26/18 1149 63  Resp 10/26/18 1149 18     Temp 10/26/18 1149 97.9 F (36.6 C)     Temp Source 10/26/18 1149 Oral     SpO2 10/26/18 1149 100 %     Weight 10/26/18 1147 173 lb (78.5 kg)     Height 10/26/18 1147 5\' 8"  (1.727 m)     Head Circumference --      Peak Flow --      Pain Score 10/26/18 1147 2     Pain Loc --      Pain Edu? --      Excl. in [place]? --    Updated Vital Signs BP 130/78 (BP Location: Left Arm)   Pulse 63   Temp 97.9 F (36.6 C) (Oral)   Resp 18   Ht 5\' 8"  (1.727 m)   Wt 78.5 kg   SpO2 100%   BMI 26.30 kg/m   Visual Acuity Right Eye Distance:   Left Eye Distance:   Bilateral Distance:    Right Eye Near:   Left Eye Near:    Bilateral Near:     Physical Exam Vitals signs and nursing note reviewed.  Constitutional:       General: She is not in acute distress.    Appearance: Normal appearance.  HENT:     Head: Normocephalic and atraumatic.     Mouth/Throat:     Mouth: Mucous membranes are moist.     Pharynx: Oropharynx is clear. No posterior oropharyngeal erythema.  Eyes:     General:        Right eye: No discharge.        Left eye: No discharge.     Conjunctiva/sclera: Conjunctivae normal.  Cardiovascular:     Rate and Rhythm: Normal rate and regular rhythm.  Pulmonary:     Effort: Pulmonary effort is normal.     Breath sounds: Normal breath sounds. No wheezing, rhonchi or rales.  Neurological:     Mental Status: She is alert.  Psychiatric:        Mood and Affect: Mood normal.        Behavior: Behavior normal.    UC Treatments / Results  Labs (all labs ordered are listed, but only abnormal results are displayed) Labs Reviewed  CBC WITH DIFFERENTIAL/PLATELET  COMPREHENSIVE METABOLIC PANEL  TSH    EKG None  Radiology Dg Chest 2 View  Result Date: 10/26/2018 CLINICAL DATA:  Upper back pain and cough. EXAM: CHEST - 2 VIEW COMPARISON:  None. FINDINGS: Heart size is normal. Lungs are clear. There is no edema or effusion. No focal airspace disease is present. There is some pleural thickening at the apices. Lungs are mildly hyperexpanded. IMPRESSION: 1. No acute cardiopulmonary disease. 2. Mild changes of COPD. Electronically Signed   By: San Morelle M.D.   On: 10/26/2018 12:46    Procedures Procedures (including critical care time)  Medications Ordered in UC Medications - No data to display  Initial Impression / Assessment and Plan / UC Course  I have reviewed the triage vital signs and the nursing notes.  Pertinent labs & imaging results that were available during my care of the patient were reviewed by me and considered in my medical decision making (see chart for details).    63 year old female presents with cough amongst other complaints.  X-ray negative.  She is  well-appearing.  Patient concerned given persistent symptoms and her prior history of cancer.  She desired to have labs drawn.  Awaiting laboratory studies.  Azithromycin  to be filled if she fails to improve or worsens.  Final Clinical Impressions(s) / UC Diagnoses   Final diagnoses:  Cough     Discharge Instructions     Rest.  Fluids.  If you fail to improve or worsen, start the antibiotic.  Take care  Dr. Lacinda Axon     ED Prescriptions    Medication Sig Dispense Auth. Provider   azithromycin (ZITHROMAX) 250 MG tablet 2 tablets on day 1, then 1 tablet daily on days 2-5. 6 tablet Coral Spikes, DO     Controlled Substance Prescriptions [place] Controlled Substance Registry consulted? Not Applicable   Coral Spikes, DO 10/26/18 1316

## 2018-10-28 ENCOUNTER — Telehealth (HOSPITAL_COMMUNITY): Payer: Self-pay | Admitting: Emergency Medicine

## 2018-10-28 NOTE — Telephone Encounter (Signed)
Normal labs, Patient contacted and made aware of all results, all questions answered.

## 2021-10-17 DIAGNOSIS — M1610 Unilateral primary osteoarthritis, unspecified hip: Secondary | ICD-10-CM | POA: Insufficient documentation

## 2021-10-17 DIAGNOSIS — S93409A Sprain of unspecified ligament of unspecified ankle, initial encounter: Secondary | ICD-10-CM | POA: Insufficient documentation

## 2021-10-17 DIAGNOSIS — M539 Dorsopathy, unspecified: Secondary | ICD-10-CM | POA: Insufficient documentation

## 2021-10-17 DIAGNOSIS — M545 Low back pain, unspecified: Secondary | ICD-10-CM | POA: Insufficient documentation

## 2021-10-17 DIAGNOSIS — S83249A Other tear of medial meniscus, current injury, unspecified knee, initial encounter: Secondary | ICD-10-CM | POA: Insufficient documentation

## 2021-10-17 DIAGNOSIS — M179 Osteoarthritis of knee, unspecified: Secondary | ICD-10-CM | POA: Insufficient documentation

## 2022-01-30 ENCOUNTER — Telehealth: Payer: Medicare Other | Admitting: Physician Assistant

## 2022-01-30 DIAGNOSIS — B9689 Other specified bacterial agents as the cause of diseases classified elsewhere: Secondary | ICD-10-CM | POA: Diagnosis not present

## 2022-01-30 DIAGNOSIS — J208 Acute bronchitis due to other specified organisms: Secondary | ICD-10-CM

## 2022-01-30 MED ORDER — BENZONATATE 100 MG PO CAPS: 100.0000 mg | ORAL_CAPSULE | Freq: Three times a day (TID) | ORAL | 0 refills | Status: DC | PRN

## 2022-01-30 MED ORDER — AZITHROMYCIN 250 MG PO TABS: ORAL_TABLET | ORAL | 0 refills | Status: AC

## 2022-01-30 NOTE — Patient Instructions (Signed)
Lovette Cliche, thank you for joining Mar Daring, PA-C for today's virtual visit.  While this provider is not your primary care provider (PCP), if your PCP is located in our provider database this encounter information will be shared with them immediately following your visit.  Consent: (Patient) Colleen Price provided verbal consent for this virtual visit at the beginning of the encounter.  Current Medications:  Current Outpatient Medications:    azithromycin (ZITHROMAX) 250 MG tablet, Take 2 tablets on day 1, then 1 tablet daily on days 2 through 5, Disp: 6 tablet, Rfl: 0   benzonatate (TESSALON) 100 MG capsule, Take 1 capsule (100 mg total) by mouth 3 (three) times daily as needed., Disp: 30 capsule, Rfl: 0   Medications ordered in this encounter:  Meds ordered this encounter  Medications   azithromycin (ZITHROMAX) 250 MG tablet    Sig: Take 2 tablets on day 1, then 1 tablet daily on days 2 through 5    Dispense:  6 tablet    Refill:  0    Order Specific Question:   Supervising Provider    Answer:   Sabra Heck, BRIAN [[id number]]   benzonatate (TESSALON) 100 MG capsule    Sig: Take 1 capsule (100 mg total) by mouth 3 (three) times daily as needed.    Dispense:  30 capsule    Refill:  0    Order Specific Question:   Supervising Provider    Answer:   Sabra Heck, [place]     *If you need refills on other medications prior to your next appointment, please contact your pharmacy*  Follow-Up: Call back or seek an in-person evaluation if the symptoms worsen or if the condition fails to improve as anticipated.  Other Instructions Acute Bronchitis, Adult  Acute bronchitis is sudden inflammation of the main airways (bronchi) that come off the windpipe (trachea) in the lungs. The swelling causes the airways to get smaller and make more mucus than normal. This can make it hard to breathe and can cause coughing or noisy breathing (wheezing). Acute bronchitis may last several weeks. The  cough may last longer. Allergies, asthma, and exposure to smoke may make the condition worse. What are the causes? This condition can be caused by germs and by substances that irritate the lungs, including: Cold and flu viruses. The most common cause of this condition is the virus that causes the common cold. Bacteria. This is less common. Breathing in substances that irritate the lungs, including: Smoke from cigarettes and other forms of tobacco. Dust and pollen. Fumes from household cleaning products, gases, or burned fuel. Indoor or outdoor air pollution. What increases the risk? The following factors may make you more likely to develop this condition: A weak body's defense system, also called the immune system. A condition that affects your lungs and breathing, such as asthma. What are the signs or symptoms? Common symptoms of this condition include: Coughing. This may bring up clear, yellow, or green mucus from your lungs (sputum). Wheezing. Runny or stuffy nose. Having too much mucus in your lungs (chest congestion). Shortness of breath. Aches and pains, including sore throat or chest. How is this diagnosed? This condition is usually diagnosed based on: Your symptoms and medical history. A physical exam. You may also have other tests, including tests to rule out other conditions, such as pneumonia. These tests include: A test of lung function. Test of a mucus sample to look for the presence of bacteria. Tests to check the oxygen  level in your blood. Blood tests. Chest X-ray. How is this treated? Most cases of acute bronchitis clear up over time without treatment. Your health care provider may recommend: Drinking more fluids to help thin your mucus so it is easier to cough up. Taking inhaled medicine (inhaler) to improve air flow in and out of your lungs. Using a vaporizer or a humidifier. These are machines that add water to the air to help you breathe better. Taking a  medicine that thins mucus and clears congestion (expectorant). Taking a medicine that prevents or stops coughing (cough suppressant). It is not common to take an antibiotic medicine for this condition. Follow these instructions at home:  Take over-the-counter and prescription medicines only as told by your health care provider. Use an inhaler, vaporizer, or humidifier as told by your health care provider. Take two teaspoons (10 mL) of honey at bedtime to lessen coughing at night. Drink enough fluid to keep your urine pale yellow. Do not use any products that contain nicotine or tobacco. These products include cigarettes, chewing tobacco, and vaping devices, such as e-cigarettes. If you need help quitting, ask your health care provider. Get plenty of rest. Return to your normal activities as told by your health care provider. Ask your health care provider what activities are safe for you. Keep all follow-up visits. This is important. How is this prevented? To lower your risk of getting this condition again: Wash your hands often with soap and water for at least 20 seconds. If soap and water are not available, use hand sanitizer. Avoid contact with people who have cold symptoms. Try not to touch your mouth, nose, or eyes with your hands. Avoid breathing in smoke or chemical fumes. Breathing smoke or chemical fumes will make your condition worse. Get the flu shot every year. Contact a health care provider if: Your symptoms do not improve after 2 weeks. You have trouble coughing up the mucus. Your cough keeps you awake at night. You have a fever. Get help right away if you: Cough up blood. Feel pain in your chest. Have severe shortness of breath. Faint or keep feeling like you are going to faint. Have a severe headache. Have a fever or chills that get worse. These symptoms may represent a serious problem that is an emergency. Do not wait to see if the symptoms will go away. Get medical  help right away. Call your local emergency services (911 in the [place] Do not drive yourself to the hospital. Summary Acute bronchitis is inflammation of the main airways (bronchi) that come off the windpipe (trachea) in the lungs. The swelling causes the airways to get smaller and make more mucus than normal. Drinking more fluids can help thin your mucus so it is easier to cough up. Take over-the-counter and prescription medicines only as told by your health care provider. Do not use any products that contain nicotine or tobacco. These products include cigarettes, chewing tobacco, and vaping devices, such as e-cigarettes. If you need help quitting, ask your health care provider. Contact a health care provider if your symptoms do not improve after 2 weeks. This information is not intended to replace advice given to you by your health care provider. Make sure you discuss any questions you have with your health care provider. Document Revised: 09/08/2021 Document Reviewed: 09/29/2020 Elsevier Patient Education  [place].    If you have been instructed to have an in-person evaluation today at a local Urgent Care facility, please use the  link below. It will take you to a list of all of our available [facility], including address, phone number and hours of operation. Please do not delay care.  [facility]  If you or a family member do not have a primary care provider, use the link below to schedule a visit and establish care. When you choose a [facility] primary care physician or advanced practice provider, you gain a long-term partner in health. Find a Primary Care Provider  Learn more about [facility]'s in-office and virtual care options: [facility]

## 2022-01-30 NOTE — Progress Notes (Signed)
Virtual Visit Consent   Colleen Price, you are scheduled for a virtual visit with a [facility] provider today. Just as with appointments in the office, your consent must be obtained to participate. Your consent will be active for this visit and any virtual visit you may have with one of our providers in the next 365 days. If you have a MyChart account, a copy of this consent can be sent to you electronically.  As this is a virtual visit, video technology does not allow for your provider to perform a traditional examination. This may limit your provider's ability to fully assess your condition. If your provider identifies any concerns that need to be evaluated in person or the need to arrange testing (such as labs, EKG, etc.), we will make arrangements to do so. Although advances in technology are sophisticated, we cannot ensure that it will always work on either your end or our end. If the connection with a video visit is poor, the visit may have to be switched to a telephone visit. With either a video or telephone visit, we are not always able to ensure that we have a secure connection.  By engaging in this virtual visit, you consent to the provision of healthcare and authorize for your insurance to be billed (if applicable) for the services provided during this visit. Depending on your insurance coverage, you may receive a charge related to this service.  I need to obtain your verbal consent now. Are you willing to proceed with your visit today? [name] has provided verbal consent on 01/30/2022 for a virtual visit (video or telephone). Mar Daring, PA-C  Date: 01/30/2022 7:55 AM  Virtual Visit via Video Note   I, Mar Daring, connected with  CHENISE MULVIHILL  ([id number], 1956/01/13) on 01/30/22 at  7:45 AM EDT by a video-enabled telemedicine application and verified that I am speaking with the correct person using two identifiers.  Location: Patient: Virtual Visit Location  Patient: Home Provider: Virtual Visit Location Provider: Home Office   I discussed the limitations of evaluation and management by telemedicine and the availability of in person appointments. The patient expressed understanding and agreed to proceed.    History of Present Illness: Colleen Price is a 66 y.o. who identifies as a female who was assigned female at birth, and is being seen today for URI symptoms.  HPI: URI  This is a new problem. The current episode started in the past 7 days (Thursday, 01/26/22). The problem has been unchanged. There has been no fever. Associated symptoms include congestion, coughing (mostly dry), diarrhea (yesterday), ear pain (off and on), headaches and a sore throat (initial symptom). Pertinent negatives include no nausea, plugged ear sensation, rhinorrhea, sinus pain or vomiting. Associated symptoms comments: dizziness. She has tried acetaminophen (alka seltzer cold and flu) for the symptoms. The treatment provided no relief.      Problems: There are no problems to display for this patient.   Allergies:  Allergies  Allergen Reactions   Adhesive [Tape] Rash   Conjugated Estrogens Nausea And Vomiting and Rash    Lupus like symptoms   Medications:  Current Outpatient Medications:    azithromycin (ZITHROMAX) 250 MG tablet, Take 2 tablets on day 1, then 1 tablet daily on days 2 through 5, Disp: 6 tablet, Rfl: 0   benzonatate (TESSALON) 100 MG capsule, Take 1 capsule (100 mg total) by mouth 3 (three) times daily as needed., Disp: 30 capsule, Rfl: 0  Observations/Objective: Patient is well-developed, well-nourished in no acute distress.  Resting comfortably at home.  Head is normocephalic, atraumatic.  No labored breathing.  Speech is clear and coherent with logical content.  Patient is alert and oriented at baseline.    Assessment and Plan: 1. Acute bacterial bronchitis - azithromycin (ZITHROMAX) 250 MG tablet; Take 2 tablets on day 1, then 1 tablet  daily on days 2 through 5  Dispense: 6 tablet; Refill: 0 - benzonatate (TESSALON) 100 MG capsule; Take 1 capsule (100 mg total) by mouth 3 (three) times daily as needed.  Dispense: 30 capsule; Refill: 0  - Worsening over a week despite OTC medications - Will treat with Z-pack and tessalon perles - Can continue Mucinex  - Push fluids.  - Rest.  - Steam and humidifier can help - Seek in person evaluation if worsening or symptoms fail to improve    Follow Up Instructions: I discussed the assessment and treatment plan with the patient. The patient was provided an opportunity to ask questions and all were answered. The patient agreed with the plan and demonstrated an understanding of the instructions.  A copy of instructions were sent to the patient via MyChart unless otherwise noted below.   The patient was advised to call back or seek an in-person evaluation if the symptoms worsen or if the condition fails to improve as anticipated.  Time:  I spent 12 minutes with the patient via telehealth technology discussing the above problems/concerns.    Mar Daring, PA-C

## 2022-02-23 ENCOUNTER — Other Ambulatory Visit: Payer: Self-pay | Admitting: Family Medicine

## 2022-02-23 DIAGNOSIS — M8588 Other specified disorders of bone density and structure, other site: Secondary | ICD-10-CM

## 2022-02-23 DIAGNOSIS — N951 Menopausal and female climacteric states: Secondary | ICD-10-CM

## 2022-02-23 LAB — VITAMIN D 25 HYDROXY (VIT D DEFICIENCY, FRACTURES): Vit D, 25-Hydroxy: 38.6

## 2022-02-23 LAB — BASIC METABOLIC PANEL
BUN: 21 (ref 4–21)
CO2: 29 — AB (ref 13–22)
Chloride: 107 (ref 99–108)
Creatinine: 0.6 (ref 0.5–1.1)
Glucose: 85
Potassium: 4.1 mEq/L (ref 3.5–5.1)
Sodium: 142 (ref 137–147)

## 2022-02-23 LAB — LIPID PANEL
Cholesterol: 191 (ref 0–200)
HDL: 55 (ref 35–70)
LDL Cholesterol: 119
Triglycerides: 84 (ref 40–160)

## 2022-02-23 LAB — COMPREHENSIVE METABOLIC PANEL: Calcium: 9.4 (ref 8.7–10.7)

## 2022-03-16 ENCOUNTER — Inpatient Hospital Stay: Admission: RE | Admit: 2022-03-16 | Payer: Medicare Other | Source: Ambulatory Visit

## 2022-03-29 ENCOUNTER — Telehealth: Payer: Medicare Other | Admitting: Nurse Practitioner

## 2022-03-29 DIAGNOSIS — J069 Acute upper respiratory infection, unspecified: Secondary | ICD-10-CM

## 2022-03-29 MED ORDER — BENZONATATE 100 MG PO CAPS: 100.0000 mg | ORAL_CAPSULE | Freq: Two times a day (BID) | ORAL | 0 refills | Status: DC | PRN

## 2022-03-29 MED ORDER — AMOXICILLIN-POT CLAVULANATE 875-125 MG PO TABS: 1.0000 | ORAL_TABLET | Freq: Two times a day (BID) | ORAL | 0 refills | Status: DC

## 2022-03-29 NOTE — Patient Instructions (Signed)

## 2022-03-29 NOTE — Progress Notes (Signed)
Virtual Visit Consent   Colleen Price, you are scheduled for a virtual visit with Mary-Margaret Hassell Done, [place], a [facility] provider, today.     Just as with appointments in the office, your consent must be obtained to participate.  Your consent will be active for this visit and any virtual visit you may have with one of our providers in the next 365 days.     If you have a MyChart account, a copy of this consent can be sent to you electronically.  All virtual visits are billed to your insurance company just like a traditional visit in the office.    As this is a virtual visit, video technology does not allow for your provider to perform a traditional examination.  This may limit your provider's ability to fully assess your condition.  If your provider identifies any concerns that need to be evaluated in person or the need to arrange testing (such as labs, EKG, etc.), we will make arrangements to do so.     Although advances in technology are sophisticated, we cannot ensure that it will always work on either your end or our end.  If the connection with a video visit is poor, the visit may have to be switched to a telephone visit.  With either a video or telephone visit, we are not always able to ensure that we have a secure connection.     I need to obtain your verbal consent now.   Are you willing to proceed with your visit today? YES   Colleen Price has provided verbal consent on 03/29/2022 for a virtual visit (video or telephone).   Mary-Margaret Hassell Done, FNP   Date: 03/29/2022 9:07 AM   Virtual Visit via Video Note   I, Mary-Margaret Hassell Done, connected with Colleen Price ([id number], [birth year]) on 03/29/22 at  9:30 AM EDT by a video-enabled telemedicine application and verified that I am speaking with the correct person using two identifiers.  Location: Patient: Virtual Visit Location Patient: Home Provider: Virtual Visit Location Provider: Mobile   I discussed the limitations of  evaluation and management by telemedicine and the availability of in person appointments. The patient expressed understanding and agreed to proceed.    History of Present Illness: Colleen Price is a 66 y.o. who identifies as a female who was assigned female at birth, and is being seen today for sore throat.  HPI: Sore Throat  This is a new problem. The current episode started in the past 7 days. The problem has been gradually worsening. Neither side of throat is experiencing more pain than the other. The maximum temperature recorded prior to her arrival was 100.4 - 100.9 F. The fever has been present for 1 to 2 days. The pain is at a severity of 4/10. Associated symptoms include congestion, coughing, headaches and trouble swallowing. Pertinent negatives include no ear pain. She has tried acetaminophen for the symptoms.    Review of Systems  HENT:  Positive for congestion and trouble swallowing. Negative for ear pain.   Respiratory:  Positive for cough.   Neurological:  Positive for headaches.    Problems: There are no problems to display for this patient.   Allergies:  Allergies  Allergen Reactions   Adhesive [Tape] Rash   Conjugated Estrogens Nausea And Vomiting and Rash    Lupus like symptoms   Medications:  Current Outpatient Medications:    benzonatate (TESSALON) 100 MG capsule, Take 1 capsule (100 mg total) by mouth 3 (  three) times daily as needed., Disp: 30 capsule, Rfl: 0  Observations/Objective: Patient is well-developed, well-nourished in no acute distress.  Resting comfortably  at home.  Head is normocephalic, atraumatic.  No labored breathing.  Speech is clear and coherent with logical content.  Patient is alert and oriented at baseline.  Raspy voice  Assessment and Plan:  Colleen Price in today with chief complaint of Sore Throat   1. URI with cough and congestion 1. Take meds as prescribed 2. Use a cool mist humidifier especially during the winter months and  when heat has been humid. 3. Use saline nose sprays frequently 4. Saline irrigations of the nose can be very helpful if done frequently.  * 4X daily for 1 week*  * Use of a nettie pot can be helpful with this. Follow directions with this* 5. Drink plenty of fluids 6. Keep thermostat turn down low 7.For any cough or congestion- tessalon perles 8. For fever or aces or pains- take tylenol or ibuprofen appropriate for age and weight.  * for fevers greater than 101 orally you may alternate ibuprofen and tylenol every  3 hours.   Meds ordered this encounter  Medications   amoxicillin-clavulanate (AUGMENTIN) 875-125 MG tablet    Sig: Take 1 tablet by mouth 2 (two) times daily.    Dispense:  14 tablet    Refill:  0    Order Specific Question:   Supervising Provider    Answer:   Chase Picket [[id number]]   benzonatate (TESSALON) 100 MG capsule    Sig: Take 1 capsule (100 mg total) by mouth 2 (two) times daily as needed for cough.    Dispense:  20 capsule    Refill:  0    Order Specific Question:   Supervising Provider    Answer:   Chase Picket [id number]      Follow Up Instructions: I discussed the assessment and treatment plan with the patient. The patient was provided an opportunity to ask questions and all were answered. The patient agreed with the plan and demonstrated an understanding of the instructions.  A copy of instructions were sent to the patient via MyChart.  The patient was advised to call back or seek an in-person evaluation if the symptoms worsen or if the condition fails to improve as anticipated.  Time:  I spent 8 minutes with the patient via telehealth technology discussing the above problems/concerns.    Mary-Margaret Hassell Done, FNP

## 2022-08-07 ENCOUNTER — Telehealth: Payer: Medicare Other | Admitting: Family Medicine

## 2022-08-07 DIAGNOSIS — B9689 Other specified bacterial agents as the cause of diseases classified elsewhere: Secondary | ICD-10-CM | POA: Diagnosis not present

## 2022-08-07 DIAGNOSIS — J208 Acute bronchitis due to other specified organisms: Secondary | ICD-10-CM

## 2022-08-07 MED ORDER — AMOXICILLIN-POT CLAVULANATE 875-125 MG PO TABS: 1.0000 | ORAL_TABLET | Freq: Two times a day (BID) | ORAL | 0 refills | Status: AC

## 2022-08-07 NOTE — Progress Notes (Signed)
Virtual Visit Consent   Colleen Price, you are scheduled for a virtual visit with a [facility] provider today. Just as with appointments in the office, your consent must be obtained to participate. Your consent will be active for this visit and any virtual visit you may have with one of our providers in the next 365 days. If you have a MyChart account, a copy of this consent can be sent to you electronically.  As this is a virtual visit, video technology does not allow for your provider to perform a traditional examination. This may limit your provider's ability to fully assess your condition. If your provider identifies any concerns that need to be evaluated in person or the need to arrange testing (such as labs, EKG, etc.), we will make arrangements to do so. Although advances in technology are sophisticated, we cannot ensure that it will always work on either your end or our end. If the connection with a video visit is poor, the visit may have to be switched to a telephone visit. With either a video or telephone visit, we are not always able to ensure that we have a secure connection.  By engaging in this virtual visit, you consent to the provision of healthcare and authorize for your insurance to be billed (if applicable) for the services provided during this visit. Depending on your insurance coverage, you may receive a charge related to this service.  I need to obtain your verbal consent now. Are you willing to proceed with your visit today? Colleen Price has provided verbal consent on 08/07/2022 for a virtual visit (video or telephone). Perlie Mayo, NP  Date: 08/07/2022 12:32 PM  Virtual Visit via Video Note   I, Perlie Mayo, connected with  Colleen Price  ([id number], [birth year]) on 08/07/22 at 12:30 PM EST by a video-enabled telemedicine application and verified that I am speaking with the correct person using two identifiers.  Location: Patient: Virtual Visit Location Patient:  Home Provider: Virtual Visit Location Provider: Home Office   I discussed the limitations of evaluation and management by telemedicine and the availability of in person appointments. The patient expressed understanding and agreed to proceed.    History of Present Illness: Colleen Price is a 67 y.o. who identifies as a female who was assigned female at birth, and is being seen today for sore throat that started- last Tuesday going on 8 days ago. As developed a cough-  HPI: Cough This is a new problem. Episode onset: 8 days ago. The problem has been gradually worsening. The problem occurs constantly. The cough is Productive of sputum. Associated symptoms include ear pain, headaches, nasal congestion and a sore throat. Pertinent negatives include no chest pain, chills, ear congestion, fever, heartburn, hemoptysis, myalgias, postnasal drip, rash, rhinorrhea, shortness of breath, sweats, weight loss or wheezing. Associated symptoms comments: Ear pain on right- with popping. The symptoms are aggravated by pollens. She has tried OTC cough suppressant (advil cold and sinus, and tylenol severe cold and flu) for the symptoms. The treatment provided mild relief.    Problems: There are no problems to display for this patient.   Allergies:  Allergies  Allergen Reactions   Adhesive [Tape] Rash   Conjugated Estrogens Nausea And Vomiting and Rash    Lupus like symptoms   Medications:  Current Outpatient Medications:    amoxicillin-clavulanate (AUGMENTIN) 875-125 MG tablet, Take 1 tablet by mouth 2 (two) times daily., Disp: 14 tablet, Rfl: 0   benzonatate (  TESSALON) 100 MG capsule, Take 1 capsule (100 mg total) by mouth 2 (two) times daily as needed for cough., Disp: 20 capsule, Rfl: 0  Observations/Objective: Resting comfortably  at home.   No labored breathing.  Speech is clear and coherent with logical content.  Patient is alert and oriented at baseline.    Assessment and Plan:  1. Acute  bacterial bronchitis  - amoxicillin-clavulanate (AUGMENTIN) 875-125 MG tablet; Take 1 tablet by mouth 2 (two) times daily for 7 days.  Dispense: 14 tablet; Refill: 0  -Take meds as prescribed -Rest -Use a cool mist humidifier especially during the winter months when heat dries out the air. - Use saline nose sprays frequently to help soothe nasal passages and promote drainage. -Saline irrigations of the nose can be very helpful if done frequently.             * 4X daily for 1 week*             * Use of a nettie pot can be helpful with this.  *Follow directions with this* *Boiled or distilled water only -stay hydrated by drinking plenty of fluids - Keep thermostat turn down low to prevent drying out sinuses - For any cough or congestion- robitussin DM or Delsym as needed - For fever or aches or pains- take tylenol or ibuprofen as directed on bottle             * for fevers greater than 101 orally you may alternate ibuprofen and tylenol every 3 hours.  If you do not improve you will need a follow up visit in person.                Reviewed side effects, risks and benefits of medication.    Patient acknowledged agreement and understanding of the plan.   Past Medical, Surgical, Social History, Allergies, and Medications have been Reviewed.    Follow Up Instructions: I discussed the assessment and treatment plan with the patient. The patient was provided an opportunity to ask questions and all were answered. The patient agreed with the plan and demonstrated an understanding of the instructions.  A copy of instructions were sent to the patient via MyChart unless otherwise noted below.    The patient was advised to call back or seek an in-person evaluation if the symptoms worsen or if the condition fails to improve as anticipated.  Time:  I spent 10 minutes with the patient via telehealth technology discussing the above problems/concerns.    Perlie Mayo, NP

## 2022-08-07 NOTE — Patient Instructions (Signed)
  Lovette Cliche, thank you for joining Perlie Mayo, NP for today's virtual visit.  While this provider is not your primary care provider (PCP), if your PCP is located in our provider database this encounter information will be shared with them immediately following your visit.   [facility] account gives you access to today's visit and all your visits, tests, and labs performed at [facility] " click here if you don't have a [facility] account or go to [email or web address]  Consent: (Patient) Colleen Price provided verbal consent for this virtual visit at the beginning of the encounter.  Current Medications:  Current Outpatient Medications:    amoxicillin-clavulanate (AUGMENTIN) 875-125 MG tablet, Take 1 tablet by mouth 2 (two) times daily for 7 days., Disp: 14 tablet, Rfl: 0   Medications ordered in this encounter:  Meds ordered this encounter  Medications   amoxicillin-clavulanate (AUGMENTIN) 875-125 MG tablet    Sig: Take 1 tablet by mouth 2 (two) times daily for 7 days.    Dispense:  14 tablet    Refill:  0    Order Specific Question:   Supervising Provider    Answer:   Chase Picket [id number]     *If you need refills on other medications prior to your next appointment, please contact your pharmacy*  Follow-Up: Call back or seek an in-person evaluation if the symptoms worsen or if the condition fails to improve as anticipated.  [facility] [phone]  Other Instructions  - Take meds as prescribed - Rest voice - Use a cool mist humidifier especially during the winter months when heat dries out the air. - Use saline nose sprays frequently to help soothe nasal passages if they are drying out. - Stay hydrated by drinking plenty of fluids - Keep thermostat turn down low to prevent drying out which can cause a dry cough. - For any cough or congestion- robitussin DM or Delsym as needed - For fever or aches or pains-  take tylenol or ibuprofen as directed on bottle             * for fevers greater than 101 orally you may alternate ibuprofen and tylenol every 3 hours.  If you do not improve you will need a follow up visit in person.                  If you have been instructed to have an in-person evaluation today at a local Urgent Care facility, please use the link below. It will take you to a list of all of our available [facility], including address, phone number and hours of operation. Please do not delay care.  [facility]  If you or a family member do not have a primary care provider, use the link below to schedule a visit and establish care. When you choose a [facility] primary care physician or advanced practice provider, you gain a long-term partner in health. Find a Primary Care Provider  Learn more about 's in-office and virtual care options: [facility]

## 2022-08-21 ENCOUNTER — Other Ambulatory Visit: Payer: Self-pay | Admitting: Adult Health

## 2022-08-21 DIAGNOSIS — Z1231 Encounter for screening mammogram for malignant neoplasm of breast: Secondary | ICD-10-CM

## 2022-10-09 ENCOUNTER — Ambulatory Visit: Admit: 2022-10-09 | Payer: Medicare Other

## 2022-10-10 ENCOUNTER — Ambulatory Visit
Admission: EM | Admit: 2022-10-10 | Discharge: 2022-10-10 | Disposition: A | Discharge status: Home / Self Care | Payer: Medicare Other | Attending: Emergency Medicine | Admitting: Emergency Medicine

## 2022-10-10 VITALS — BP 121/78 | HR 74 | Temp 98.4°F | Resp 16 | Ht 68.0 in | Wt 180.0 lb

## 2022-10-10 DIAGNOSIS — J069 Acute upper respiratory infection, unspecified: Secondary | ICD-10-CM

## 2022-10-10 LAB — GROUP A STREP BY PCR: Group A Strep by PCR: NOT DETECTED

## 2022-10-10 NOTE — ED Provider Notes (Signed)
[facility]    CSN: [id number] Arrival date & time: 10/10/22  1145      History   Chief Complaint Chief Complaint  Patient presents with   Sore Throat    Appt   Cough   Headache    HPI Colleen Price is a 67 y.o. female  presents for evaluation of URI symptoms for 5 days. Patient reports associated symptoms of cough, congestion, sore throat. Denies N/V/D, fevers, ear pain, body aches, shortness of breath. Patient does not have a hx of asthma or smoking. No known sick contacts.  States she was cleaning her gutters prior to onset of symptoms.  Pt has taken Tylenol Cold and flu OTC for symptoms. Pt has no other concerns at this time.    Sore Throat Associated symptoms include headaches.  Cough Associated symptoms: headaches and sore throat   Headache Associated symptoms: congestion, cough and sore throat     Past Medical History:  Diagnosis Date   Arthritis    KNEES   Cancer (HCC)    Complication of anesthesia    PONV (postoperative nausea and vomiting)    PT STATES SCOPALAMINE PATCH HELPED WITH HER LAST SURGERY    There are no problems to display for this patient.   Past Surgical History:  Procedure Laterality Date   ABDOMINAL HYSTERECTOMY  1990   BREAST LUMPECTOMY WITH SENTINEL LYMPH NODE BIOPSY Right 10/14/2015   Procedure: BREAST LUMPECTOMY WITH SENTINEL LYMPH NODE BX;  Surgeon: Kieth Brightly, MD;  Location: [facility];  Service: General;  Laterality: Right;   CATARACT EXTRACTION W/PHACO Right 01/23/2018   Procedure: CATARACT EXTRACTION PHACO AND INTRAOCULAR LENS PLACEMENT (IOC)  RIGHT;  Surgeon: Lockie Mola, MD;  Location: [facility];  Service: Ophthalmology;  Laterality: Right;   CATARACT EXTRACTION W/PHACO Left 02/13/2018   Procedure: CATARACT EXTRACTION PHACO AND INTRAOCULAR LENS PLACEMENT (IOC) LEFT;  Surgeon: Lockie Mola, MD;  Location: [facility];  Service: Ophthalmology;  Laterality: Left;   COLONOSCOPY      KNEE ARTHROSCOPY      OB History     Gravida  1   Para  1   Term  1   Preterm      AB      Living  1      SAB      IAB      Ectopic      Multiple      Live Births           Obstetric Comments  1st Menstrual Cycle:  17 1st Pregnancy:  25           Home Medications    Prior to Admission medications   Not on File    Family History History reviewed. No pertinent family history.  Social History Social History   Tobacco Use   Smoking status: Never   Smokeless tobacco: Never  Vaping Use   Vaping Use: Never used  Substance Use Topics   Alcohol use: No    Alcohol/week: 0.0 standard drinks of alcohol   Drug use: No     Allergies   Adhesive [tape], Conjugated estrogens, and Silicone   Review of Systems Review of Systems  HENT:  Positive for congestion and sore throat.   Respiratory:  Positive for cough.   Neurological:  Positive for headaches.     Physical Exam Triage Vital Signs ED Triage Vitals  Enc Vitals Group     BP 10/10/22 1205 121/78  Pulse Rate 10/10/22 1205 74     Resp 10/10/22 1205 16     Temp 10/10/22 1205 98.4 F (36.9 C)     Temp Source 10/10/22 1205 Oral     SpO2 10/10/22 1205 97 %     Weight 10/10/22 1204 180 lb (81.6 kg)     Height 10/10/22 1204 5\' 8"  (1.727 m)     Head Circumference --      Peak Flow --      Pain Score 10/10/22 1207 3     Pain Loc --      Pain Edu? --      Excl. in GC? --    No data found.  Updated Vital Signs BP 121/78 (BP Location: Left Arm)   Pulse 74   Temp 98.4 F (36.9 C) (Oral)   Resp 16   Ht 5\' 8"  (1.727 m)   Wt 180 lb (81.6 kg)   SpO2 97%   BMI 27.37 kg/m   Visual Acuity Right Eye Distance:   Left Eye Distance:   Bilateral Distance:    Right Eye Near:   Left Eye Near:    Bilateral Near:     Physical Exam Vitals and nursing note reviewed.  Constitutional:      General: She is not in acute distress.    Appearance: She is well-developed. She is not  ill-appearing.  HENT:     Head: Normocephalic and atraumatic.     Right Ear: Tympanic membrane and ear canal normal.     Left Ear: Tympanic membrane and ear canal normal.     Nose: Congestion present.     Mouth/Throat:     Mouth: Mucous membranes are moist.     Pharynx: Oropharynx is clear. Uvula midline. Posterior oropharyngeal erythema present.     Tonsils: No tonsillar exudate or tonsillar abscesses.  Eyes:     Conjunctiva/sclera: Conjunctivae normal.     Pupils: Pupils are equal, round, and reactive to light.  Cardiovascular:     Rate and Rhythm: Normal rate and regular rhythm.     Heart sounds: Normal heart sounds.  Pulmonary:     Effort: Pulmonary effort is normal.     Breath sounds: Normal breath sounds.  Musculoskeletal:     Cervical back: Normal range of motion and neck supple.  Lymphadenopathy:     Cervical: No cervical adenopathy.  Skin:    General: Skin is warm and dry.  Neurological:     General: No focal deficit present.     Mental Status: She is alert and oriented to person, place, and time.  Psychiatric:        Mood and Affect: Mood normal.        Behavior: Behavior normal.      UC Treatments / Results  Labs (all labs ordered are listed, but only abnormal results are displayed) Labs Reviewed  GROUP A STREP BY PCR   Basic Metabolic Panel Order: [id number] Component Ref Range & Units 7 mo ago  Sodium 135 - 145 mmol/L 142  Potassium 3.4 - 4.8 mmol/L 4.1  Chloride 98 - 107 mmol/L 107  CO2 20.0 - 31.0 mmol/L 29.0  Anion Gap 5 - 14 mmol/L 6  BUN 9 - 23 mg/dL 21  Creatinine 7.82 - 9.56 mg/dL 2.13  BUN/Creatinine Ratio 33  eGFR CKD-EPI (2021) Female >=60 mL/min/1.43m2 >90  Comment: eGFR calculated with CKD-EPI 2021 equation in accordance with [employer] and [employer] of Nephrology Task Force recommendations.  Glucose  70 - 179 mg/dL 85  Calcium 8.7 - 14.7 mg/dL 9.4  Resulting Agency [facility]   Narrative  This result has an attachment that is not available.   Specimen Collected: 02/23/22 09:39   Performed by: [facility] Last Resulted: 02/23/22 17:29  Received From: [facility]  Result Received: 03/16/22 10:13    EKG   Radiology No results found.  Procedures Procedures (including critical care time)  Medications Ordered in UC Medications - No data to display  Initial Impression / Assessment and Plan / UC Course  I have reviewed the triage vital signs and the nursing notes.  Pertinent labs & imaging results that were available during my care of the patient were reviewed by me and considered in my medical decision making (see chart for details).     Reviewed recent labs and progress notes Reviewed symptoms and exam with patient.  Strep PCR negative.  Discussed viral upper respiratory infection and symptomatic treatment Patient already has Tessalon at home and will use for cough as needed.  Also advised starting allergy medicine OTC for couple days Rest and fluids PCP follow-up if symptoms do not improve ER precautions reviewed and patient verbalized understanding Final Clinical Impressions(s) / UC Diagnoses   Final diagnoses:  Viral upper respiratory tract infection     Discharge Instructions      Please treat your symptoms with over the counter cough medication, tylenol or ibuprofen, humidifier, and rest. Viral illnesses can last 7-14 days. Please follow up with your PCP if your symptoms are not improving. Please go to the ER for any worsening symptoms. This includes but is not limited to fever you can not control with tylenol or ibuprofen, you are not able to stay hydrated, you have shortness of breath or chest pain.  Thank you for choosing [facility] for your healthcare needs. I hope you feel better soon!    ED Prescriptions   None    PDMP not reviewed this encounter.   Radford Pax, NP 10/10/22 [phone]

## 2022-10-10 NOTE — Discharge Instructions (Signed)
Please treat your symptoms with over the counter cough medication, tylenol or ibuprofen, humidifier, and rest. Viral illnesses can last 7-14 days. Please follow up with your PCP if your symptoms are not improving. Please go to the ER for any worsening symptoms. This includes but is not limited to fever you can not control with tylenol or ibuprofen, you are not able to stay hydrated, you have shortness of breath or chest pain.  Thank you for choosing [facility] for your healthcare needs. I hope you feel better soon!  

## 2022-10-10 NOTE — ED Triage Notes (Signed)
Pt c/o cough,HA & sore throat x5 days. Has tried tylenol w/relief, denies any fevers.

## 2022-10-25 LAB — HM MAMMOGRAPHY

## 2022-11-03 ENCOUNTER — Ambulatory Visit (INDEPENDENT_AMBULATORY_CARE_PROVIDER_SITE_OTHER): Payer: Medicare Other

## 2022-11-03 ENCOUNTER — Ambulatory Visit
Admission: RE | Admit: 2022-11-03 | Discharge: 2022-11-03 | Disposition: A | Discharge status: Home / Self Care | Payer: Medicare Other | Attending: Physician Assistant | Admitting: Physician Assistant

## 2022-11-03 ENCOUNTER — Encounter: Payer: Self-pay | Admitting: Physician Assistant

## 2022-11-03 ENCOUNTER — Ambulatory Visit (INDEPENDENT_AMBULATORY_CARE_PROVIDER_SITE_OTHER): Payer: Medicare Other | Admitting: Physician Assistant

## 2022-11-03 ENCOUNTER — Ambulatory Visit
Admission: RE | Admit: 2022-11-03 | Discharge: 2022-11-03 | Disposition: A | Discharge status: Home / Self Care | Payer: Medicare Other | Source: Ambulatory Visit | Attending: Physician Assistant | Admitting: Physician Assistant

## 2022-11-03 VITALS — BP 126/84 | HR 58 | Temp 98.1°F | Ht 68.0 in | Wt 182.0 lb

## 2022-11-03 VITALS — BP 126/84 | Temp 98.1°F | Ht 68.0 in | Wt 182.0 lb

## 2022-11-03 DIAGNOSIS — Z853 Personal history of malignant neoplasm of breast: Secondary | ICD-10-CM | POA: Insufficient documentation

## 2022-11-03 DIAGNOSIS — M8580 Other specified disorders of bone density and structure, unspecified site: Secondary | ICD-10-CM | POA: Diagnosis not present

## 2022-11-03 DIAGNOSIS — R053 Chronic cough: Secondary | ICD-10-CM | POA: Insufficient documentation

## 2022-11-03 DIAGNOSIS — M170 Bilateral primary osteoarthritis of knee: Secondary | ICD-10-CM | POA: Insufficient documentation

## 2022-11-03 DIAGNOSIS — M159 Polyosteoarthritis, unspecified: Secondary | ICD-10-CM | POA: Insufficient documentation

## 2022-11-03 DIAGNOSIS — Z780 Asymptomatic menopausal state: Secondary | ICD-10-CM

## 2022-11-03 DIAGNOSIS — Z0000 Encounter for general adult medical examination without abnormal findings: Secondary | ICD-10-CM

## 2022-11-03 DIAGNOSIS — Z862 Personal history of diseases of the blood and blood-forming organs and certain disorders involving the immune mechanism: Secondary | ICD-10-CM | POA: Insufficient documentation

## 2022-11-03 DIAGNOSIS — M85859 Other specified disorders of bone density and structure, unspecified thigh: Secondary | ICD-10-CM | POA: Insufficient documentation

## 2022-11-03 MED ORDER — BENZONATATE 200 MG PO CAPS: 200.0000 mg | ORAL_CAPSULE | Freq: Three times a day (TID) | ORAL | 1 refills | Status: DC | PRN

## 2022-11-03 NOTE — Patient Instructions (Signed)
-  It was a pleasure to see you today! Please review your visit summary for helpful information -Lab results are usually available within 1-2 days and we will call once reviewed -I would encourage you to follow your care via MyChart where you can access lab results, notes, messages, and more -If you feel that we did a nice job today, please complete your after-visit survey and leave us a Google review! Your CMA today was Kieandra and your provider was Dan Amenda Duclos, PA-C, DMSc -Please return for follow-up in about 1 month  

## 2022-11-03 NOTE — Progress Notes (Signed)
Subjective:   Colleen Price is a 67 y.o. female who presents for Medicare Annual (Subsequent) preventive examination.  Review of Systems    Pt was in the office        Objective:    Today's Vitals   11/03/22 1347 11/03/22 1348  Height: 5\' 8"  (1.727 m)   PainSc:  4    Body mass index is 27.67 kg/m.     11/03/2022    1:52 PM 02/13/2018    8:07 AM 01/23/2018    6:40 AM 10/28/2015   10:44 AM 10/14/2015   10:13 AM  Advanced Directives  Does Patient Have a Medical Advance Directive? Yes No No No No  Type of Advance Directive Living will;Healthcare Power of Attorney      Would patient like information on creating a medical advance directive?  No - Patient declined No - Patient declined No - patient declined information No - patient declined information    Current Medications (verified) Outpatient Encounter Medications as of 11/03/2022  Medication Sig   acetaminophen (TYLENOL) 325 MG tablet Take by mouth.   desonide (DESOWEN) 0.05 % cream    ibuprofen (ADVIL) 200 MG tablet Take by mouth.   Meloxicam (MOBIC PO) Take by mouth as needed.   Multiple Vitamin (MULTI-VITAMIN) tablet Take 1 tablet by mouth daily.   No facility-administered encounter medications on file as of 11/03/2022.    Allergies (verified) Tape, Wound dressing adhesive, Conjugated estrogens, Latex, and Silicone   History: Past Medical History:  Diagnosis Date   Arthritis    KNEES   Cancer (HCC)    Complication of anesthesia    PONV (postoperative nausea and vomiting)    PT STATES SCOPALAMINE PATCH HELPED WITH HER LAST SURGERY   Past Surgical History:  Procedure Laterality Date   ABDOMINAL HYSTERECTOMY  1990   BREAST LUMPECTOMY WITH SENTINEL LYMPH NODE BIOPSY Right 10/14/2015   Procedure: BREAST LUMPECTOMY WITH SENTINEL LYMPH NODE BX;  Surgeon: Kieth Brightly, MD;  Location: [facility];  Service: General;  Laterality: Right;   CATARACT EXTRACTION W/PHACO Right 01/23/2018   Procedure: CATARACT EXTRACTION  PHACO AND INTRAOCULAR LENS PLACEMENT (IOC)  RIGHT;  Surgeon: Lockie Mola, MD;  Location: [facility];  Service: Ophthalmology;  Laterality: Right;   CATARACT EXTRACTION W/PHACO Left 02/13/2018   Procedure: CATARACT EXTRACTION PHACO AND INTRAOCULAR LENS PLACEMENT (IOC) LEFT;  Surgeon: Lockie Mola, MD;  Location: [facility];  Service: Ophthalmology;  Laterality: Left;   COLONOSCOPY     KNEE ARTHROSCOPY     Family History  Problem Relation Age of Onset   Transient ischemic attack Mother    Heart disease Father    Diabetes Father    Bladder Cancer Father    Breast cancer Sister    Social History   Socioeconomic History   Marital status: Married    Spouse name: Not on file   Number of children: 1   Years of education: Not on file   Highest education level: Not on file  Occupational History   Not on file  Tobacco Use   Smoking status: Never   Smokeless tobacco: Never  Vaping Use   Vaping Use: Never used  Substance and Sexual Activity   Alcohol use: No    Alcohol/week: 0.0 standard drinks of alcohol   Drug use: No   Sexual activity: Yes  Other Topics Concern   Not on file  Social History Narrative   Not on file   Social Determinants of Health  Financial Resource Strain: Low Risk  (11/03/2022)   Overall Financial Resource Strain (CARDIA)    Difficulty of Paying Living Expenses: Not hard at all  Food Insecurity: No Food Insecurity (11/03/2022)   Hunger Vital Sign    Worried About Running Out of Food in the Last Year: Never true    Ran Out of Food in the Last Year: Never true  Transportation Needs: No Transportation Needs (11/03/2022)   PRAPARE - Administrator, Civil Service (Medical): No    Lack of Transportation (Non-Medical): No  Physical Activity: Not on file  Stress: Not on file  Social Connections: Not on file    Tobacco Counseling Counseling given: Not Answered   Clinical Intake:  Pre-visit preparation completed:  Yes  Pain : 0-10 Pain Score: 4  Pain Location: Knee Pain Orientation: Left, Right Pain Radiating Towards: goes down leg Pain Descriptors / Indicators: Aching, Burning, Constant, Cramping, Shooting Pain Onset: Other (comment) (years) Pain Frequency:  (comes and goe swith movement)     Nutritional Risks: None Diabetes: No  How often do you need to have someone help you when you read instructions, pamphlets, or other written materials from your doctor or pharmacy?: 1 - Never  Diabetic?NO  Interpreter Needed?: No      Activities of Daily Living    11/03/2022    1:54 PM  In your present state of health, do you have any difficulty performing the following activities:  Hearing? 0  Vision? 0  Difficulty concentrating or making decisions? 0  Walking or climbing stairs? 0  Dressing or bathing? 0  Doing errands, shopping? 0  Preparing Food and eating ? N  Using the Toilet? N  In the past six months, have you accidently leaked urine? N  Do you have problems with loss of bowel control? N  Managing your Medications? N  Managing your Finances? N  Housekeeping or managing your Housekeeping? N    Patient Care Team: Remo Lipps, PA as PCP - General (Physician Assistant) Kieth Brightly, MD (General Surgery)  Indicate any recent Medical Services you may have received from other than [facility] providers in the past year (date may be approximate).     Assessment:   This is a routine wellness examination for Colleen Price.  Hearing/Vision screen Hearing Screening - Comments:: No hearing problems   Dietary issues and exercise activities discussed: Current Exercise Habits: The patient does not participate in regular exercise at present, Exercise limited by: None identified   Goals Addressed   None   Depression Screen    11/03/2022    1:54 PM 10/28/2015   10:45 AM  PHQ 2/9 Scores  PHQ - 2 Score 0 0    Fall Risk    11/03/2022    1:53 PM 10/28/2015   10:45 AM  Fall Risk    Falls in the past year? 0 No  Number falls in past yr: 0   Injury with Fall? 0   Risk for fall due to : No Fall Risks   Follow up Falls evaluation completed     FALL RISK PREVENTION PERTAINING TO THE HOME:  Any stairs in or around the home? Yes  If so, are there any without handrails? No  Home free of loose throw rugs in walkways, pet beds, electrical cords, etc? Yes  Adequate lighting in your home to reduce risk of falls? Yes   ASSISTIVE DEVICES UTILIZED TO PREVENT FALLS:  Life alert? No  Use of a cane, walker  or w/c? No  Grab bars in the bathroom? No  Shower chair or bench in shower? No  Elevated toilet seat or a handicapped toilet? No   TIMED UP AND GO:  Was the test performed? No .  Length of time to ambulate 10 feet: n/a sec.   Gait steady and fast without use of assistive device  Cognitive Function:        11/03/2022    1:54 PM  6CIT Screen  What Year? 0 points  What month? 0 points  What time? 0 points  Count back from 20 0 points  Months in reverse 0 points  Repeat phrase 2 points  Total Score 2 points    Immunizations  There is no immunization history on file for this patient.  TDAP status: Due, Education has been provided regarding the importance of this vaccine. Advised may receive this vaccine at local pharmacy or Health Dept. Aware to provide a copy of the vaccination record if obtained from local pharmacy or Health Dept. Verbalized acceptance and understanding.  Flu Vaccine status: Due, Education has been provided regarding the importance of this vaccine. Advised may receive this vaccine at local pharmacy or Health Dept. Aware to provide a copy of the vaccination record if obtained from local pharmacy or Health Dept. Verbalized acceptance and understanding.  Pneumococcal vaccine status: Due, Education has been provided regarding the importance of this vaccine. Advised may receive this vaccine at local pharmacy or Health Dept. Aware to provide a copy  of the vaccination record if obtained from local pharmacy or Health Dept. Verbalized acceptance and understanding.  Covid-19 vaccine status: Declined, Education has been provided regarding the importance of this vaccine but patient still declined. Advised may receive this vaccine at local pharmacy or Health Dept.or vaccine clinic. Aware to provide a copy of the vaccination record if obtained from local pharmacy or Health Dept. Verbalized acceptance and understanding.  Qualifies for Shingles Vaccine? Yes   Zostavax completed No   Shingrix Completed?: No.    Education has been provided regarding the importance of this vaccine. Patient has been advised to call insurance company to determine out of pocket expense if they have not yet received this vaccine. Advised may also receive vaccine at local pharmacy or Health Dept. Verbalized acceptance and understanding.  Screening Tests Health Maintenance  Topic Date Due   Hepatitis C Screening  Never done   DTaP/Tdap/Td (1 - Tdap) Never done   Zoster Vaccines- Shingrix (1 of 2) Never done   Colonoscopy  Never done   Pneumonia Vaccine 41+ Years old (1 of 1 - PCV) Never done   DEXA SCAN  02/03/2021   COVID-19 Vaccine (1) 11/19/2022 (Originally 02/03/1961)   INFLUENZA VACCINE  01/11/2023   MAMMOGRAM  10/25/2023   Medicare Annual Wellness (AWV)  11/03/2023   HPV VACCINES  Aged Out    Health Maintenance  Health Maintenance Due  Topic Date Due   Hepatitis C Screening  Never done   DTaP/Tdap/Td (1 - Tdap) Never done   Zoster Vaccines- Shingrix (1 of 2) Never done   Colonoscopy  Never done   Pneumonia Vaccine 12+ Years old (1 of 1 - PCV) Never done   DEXA SCAN  02/03/2021    Mammogram status: Completed 10/25/2022. Repeat every year  Bone Density status: Completed 07/27/2009. Results reflect: Bone density results: OSTEOPENIA. Repeat every 2-3 years.  Lung Cancer Screening: (Low Dose CT Chest recommended if Age 65-80 years, 30 pack-year currently  smoking OR have quit  w/in 15years.) does not qualify.   Lung Cancer Screening Referral: n/a  Additional Screening:  Hepatitis C Screening: does qualify; Completed n/a   Dental Screening: Recommended annual dental exams for proper oral hygiene  Community Resource Referral / Chronic Care Management: CRR required this visit?  No   CCM required this visit?  No      Plan:     I have personally reviewed and noted the following in the patient's chart:   Medical and social history Use of alcohol, tobacco or illicit drugs  Current medications and supplements including opioid prescriptions. Patient is not currently taking opioid prescriptions. Functional ability and status Nutritional status Physical activity Advanced directives List of other physicians Hospitalizations, surgeries, and ER visits in previous 12 months Vitals Screenings to include cognitive, depression, and falls Referrals and appointments  In addition, I have reviewed and discussed with patient certain preventive protocols, quality metrics, and best practice recommendations. A written personalized care plan for preventive services as well as general preventive health recommendations were provided to patient.     Eulis Canner [place], CMA   11/03/2022   Nurse Notes: none

## 2022-11-03 NOTE — Progress Notes (Signed)
Date:  11/03/2022   Name:  Colleen Price   DOB:  03-Jun-1956   MRN:  [id number]   Chief Complaint: Establish Care, Cough, and Knee Pain  Cough This is a recurrent problem. Episode onset: X4-5 years. The problem has been unchanged. The problem occurs every few hours. The cough is Non-productive. Pertinent negatives include no chest pain, fever, shortness of breath or wheezing. Associated symptoms comments: fatigue. She has tried nothing for the symptoms.  Knee Pain  There was no injury mechanism. The pain is present in the right knee and left knee. The quality of the pain is described as aching, burning, cramping, shooting and stabbing. The pain is at a severity of 4/10. The pain is mild. Pain course: comes and goes. She reports no foreign bodies present. The symptoms are aggravated by movement. She has tried acetaminophen (mobic and tylenol) for the symptoms. The treatment provided mild relief.    HPI Shresta is a delightful 67 year old female with a history of osteoarthritis multiple sites and prior breast cancer who presents today primarily to discuss chronic knee pain and hopes to obtain x-rays of both knees, last radiography approximately 4 years ago by her report.  Pain has gradually been worsening.  She uses Tylenol and ibuprofen for pain control most of the time, occasionally uses meloxicam but knows not to take same day as ibuprofen. The knee pain limits her physical activity. No major surgical interventions to either knee except meniscus repair.  She has seen several specialists over the years through [facility], but does not currently have an orthopedist.  Husband is a rep for orthopedic [employer].  Also wants to discuss persistent cough ever since contracting COVID in 2020, says she was sick for 3 weeks, has never quite recovered from the cough.  She describes it as mostly dry, occasionally productive of phlegm in the morning, unsure if related to allergies or not.  It has particularly worsened over  the last 6 months.  Occasionally takes [employer] which offer temporary relief.  Not currently using any oral antihistamines, says they make her drowsy.  No history of smoking, asthma, COPD.  Allergist many years ago said she was allergic to mold.  Certain outdoor activities such as gardening, raking, and yard work tend to exacerbate the cough.  Occasionally experiences nonexertional tightness of chest but not recently.  Denies wheezing, dyspnea, or chest pain.  Not discussed in-depth today includes history of osteopenia with last DEXA 2011.  She currently takes a daily multivitamin containing vitamin D and calcium.  She is not overly eager to repeat DEXA, but willing to discuss with me at future visit.  Medication list has been reviewed and updated.  Current Meds  Medication Sig   acetaminophen (TYLENOL) 325 MG tablet Take by mouth.   benzonatate (TESSALON) 200 MG capsule Take 1 capsule (200 mg total) by mouth 3 (three) times daily as needed for cough.   ibuprofen (ADVIL) 200 MG tablet Take by mouth.   Meloxicam (MOBIC PO) Take by mouth as needed.   Multiple Vitamin (MULTI-VITAMIN) tablet Take 1 tablet by mouth daily.     Review of Systems  Constitutional:  Negative for fatigue and fever.  Respiratory:  Positive for cough. Negative for chest tightness, shortness of breath and wheezing.   Cardiovascular:  Negative for chest pain and palpitations.  Gastrointestinal:  Negative for abdominal pain.  Musculoskeletal:  Positive for arthralgias (knees).    Patient Active Problem List   Diagnosis Date Noted  Generalized osteoarthritis of multiple sites 11/03/2022   History of breast cancer in female 11/03/2022   Osteopenia after menopause 11/03/2022   Multilevel degenerative disc disease 10/17/2021   Osteoarthritis of knee 10/17/2021   Tear of medial meniscus of knee, current 10/17/2021   Primary localized osteoarthritis of pelvic region and thigh 10/17/2021    Allergies  Allergen  Reactions   Tape Rash and Dermatitis   Wound Dressing Adhesive Dermatitis   Conjugated Estrogens Nausea And Vomiting, Rash and Other (See Comments)    Lupus like symptoms  Lupus like symptoms  Lupus like symptoms   Latex Rash   Silicone Rash     There is no immunization history on file for this patient.  Past Surgical History:  Procedure Laterality Date   ABDOMINAL HYSTERECTOMY  1990   BREAST LUMPECTOMY WITH SENTINEL LYMPH NODE BIOPSY Right 10/14/2015   Procedure: BREAST LUMPECTOMY WITH SENTINEL LYMPH NODE BX;  Surgeon: Kieth Brightly, MD;  Location: [facility];  Service: General;  Laterality: Right;   CATARACT EXTRACTION W/PHACO Right 01/23/2018   Procedure: CATARACT EXTRACTION PHACO AND INTRAOCULAR LENS PLACEMENT (IOC)  RIGHT;  Surgeon: Lockie Mola, MD;  Location: [facility];  Service: Ophthalmology;  Laterality: Right;   CATARACT EXTRACTION W/PHACO Left 02/13/2018   Procedure: CATARACT EXTRACTION PHACO AND INTRAOCULAR LENS PLACEMENT (IOC) LEFT;  Surgeon: Lockie Mola, MD;  Location: [facility];  Service: Ophthalmology;  Laterality: Left;   COLONOSCOPY     KNEE ARTHROSCOPY      Social History   Tobacco Use   Smoking status: Never   Smokeless tobacco: Never  Vaping Use   Vaping Use: Never used  Substance Use Topics   Alcohol use: No    Alcohol/week: 0.0 standard drinks of alcohol   Drug use: No    Family History  Problem Relation Age of Onset   Transient ischemic attack Mother    Heart disease Father    Diabetes Father    Bladder Cancer Father    Breast cancer Sister         11/03/2022    2:23 PM  GAD 7 : Generalized Anxiety Score  Nervous, Anxious, on Edge 1  Control/stop worrying 0  Worry too much - different things 0  Trouble relaxing 2  Restless 1  Easily annoyed or irritable 0  Afraid - awful might happen 0  Total GAD 7 Score 4  Anxiety Difficulty Not difficult at all       11/03/2022    2:22 PM 11/03/2022     1:54 PM 10/28/2015   10:45 AM  Depression screen PHQ 2/9  Decreased Interest 0 0 0  Down, Depressed, Hopeless 1 0 0  PHQ - 2 Score 1 0 0  Altered sleeping 0    Tired, decreased energy 3    Change in appetite 0    Feeling bad or failure about yourself  0    Trouble concentrating 1    Moving slowly or fidgety/restless 0    Suicidal thoughts 0    PHQ-9 Score 5    Difficult doing work/chores Somewhat difficult      BP Readings from Last 3 Encounters:  11/03/22 126/84  11/03/22 126/84  10/10/22 121/78    Wt Readings from Last 3 Encounters:  11/03/22 182 lb (82.6 kg)  11/03/22 182 lb (82.6 kg)  10/10/22 180 lb (81.6 kg)    BP 126/84   Pulse (!) 58   Temp 98.1 F (36.7 C) (Oral)   Ht  5\' 8"  (1.727 m)   Wt 182 lb (82.6 kg)   SpO2 98%   BMI 27.67 kg/m   Physical Exam Vitals and nursing note reviewed.  Constitutional:      Appearance: Normal appearance.  Neck:     Vascular: No carotid bruit.  Cardiovascular:     Rate and Rhythm: Normal rate and regular rhythm.     Heart sounds: No murmur heard.    No friction rub. No gallop.  Pulmonary:     Effort: Pulmonary effort is normal.     Breath sounds: Normal breath sounds.  Abdominal:     General: There is no distension.  Musculoskeletal:        General: Normal range of motion.  Skin:    General: Skin is warm and dry.  Neurological:     Mental Status: She is alert and oriented to person, place, and time.     Gait: Gait is intact.  Psychiatric:        Mood and Affect: Mood and affect normal.     Recent Labs     Component Value Date/Time   NA 142 02/23/2022 0000   K 4.1 02/23/2022 0000   CL 107 02/23/2022 0000   CO2 29 (A) 02/23/2022 0000   GLUCOSE 96 10/26/2018 1300   BUN 21 02/23/2022 0000   CREATININE 0.6 02/23/2022 0000   CREATININE 0.73 10/26/2018 1300   CALCIUM 9.4 02/23/2022 0000   PROT 7.2 10/26/2018 1300   PROT 7.0 09/27/2015 1451   ALBUMIN 4.1 10/26/2018 1300   ALBUMIN 4.4 09/27/2015 1451   AST  20 10/26/2018 1300   ALT 20 10/26/2018 1300   ALKPHOS 69 10/26/2018 1300   BILITOT 0.8 10/26/2018 1300   BILITOT 0.5 09/27/2015 1451   GFRNONAA >60 10/26/2018 1300   GFRAA >60 10/26/2018 1300    Lab Results  Component Value Date   WBC 6.7 10/26/2018   HGB 13.1 10/26/2018   HCT 40.7 10/26/2018   MCV 91.3 10/26/2018   PLT 255 10/26/2018   No results found for: "HGBA1C" Lab Results  Component Value Date   CHOL 191 02/23/2022   HDL 55 02/23/2022   LDLCALC 119 02/23/2022   TRIG 84 02/23/2022   Lab Results  Component Value Date   TSH 1.253 10/26/2018     Assessment and Plan:  1. Primary osteoarthritis of both knees Continue current regimen for medications.  Obtain bilateral knee x-ray today, will refer to orthopedist if appropriate. - DG Knee Complete 4 Views Right - DG Knee Complete 4 Views Left  2. Persistent cough Will refill benzonatate for as needed use.  Advised patient I do suspect allergic etiology to this.  Start OTC antihistamine such as cetirizine, loratadine, etc.  Advise she may consider taking this at night to avoid drowsy side effect.  See how this goes over the next few weeks and if necessary might prescribe Singulair next visit.  We will go ahead and obtain chest x-ray today as she has not had one in 4 years.   Reviewed CXR 10/26/2018 which showed "some pleural thickening at the apices. Lungs are mildly hyperexpanded. Mild changes of COPD"   - benzonatate (TESSALON) 200 MG capsule; Take 1 capsule (200 mg total) by mouth 3 (three) times daily as needed for cough.  Dispense: 30 capsule; Refill: 1 - DG Chest 2 View  3. History of anemia Recent labs September 2023 show normal BMP and mild HLD, no need to repeat at this time.  They did  not check CBC however so we will obtain this today due to her history of anemia. - CBC with Differential/Platelet  4. Osteopenia after menopause Plan for future discussion regarding DEXA, which I recommended to the patient  today.  For now, continue daily multivitamin.  Last vitamin D and calcium September 2023 were normal.   Return in about 4 weeks (around 12/01/2022) for OV cough.   AWV also completed today by Laurian Brim, CMA  Partially dictated using Dragon software. Any errors are unintentional.  Alvester Morin, PA-C, DMSc, Nutritionist [facility] [phone]

## 2022-11-04 LAB — CBC WITH DIFFERENTIAL/PLATELET
Basophils Absolute: 0.1 10*3/uL (ref 0.0–0.2)
Basos: 1 %
EOS (ABSOLUTE): 0.2 10*3/uL (ref 0.0–0.4)
Eos: 2 %
Hematocrit: 38.4 % (ref 34.0–46.6)
Hemoglobin: 12.9 g/dL (ref 11.1–15.9)
Immature Grans (Abs): 0 10*3/uL (ref 0.0–0.1)
Immature Granulocytes: 0 %
Lymphocytes Absolute: 2.9 10*3/uL (ref 0.7–3.1)
Lymphs: 32 %
MCH: 28.9 pg (ref 26.6–33.0)
MCHC: 33.6 g/dL (ref 31.5–35.7)
MCV: 86 fL (ref 79–97)
Monocytes Absolute: 0.7 10*3/uL (ref 0.1–0.9)
Monocytes: 8 %
Neutrophils Absolute: 5.1 10*3/uL (ref 1.4–7.0)
Neutrophils: 57 %
Platelets: 288 10*3/uL (ref 150–450)
RBC: 4.46 x10E6/uL (ref 3.77–5.28)
RDW: 11.8 % (ref 11.7–15.4)
WBC: 8.9 10*3/uL (ref 3.4–10.8)

## 2022-11-09 ENCOUNTER — Telehealth: Payer: Self-pay

## 2022-11-09 NOTE — Telephone Encounter (Signed)
It looks like the results are in at this time.  KP

## 2022-11-09 NOTE — Telephone Encounter (Signed)
-----   Message from Remo Lipps, [place] sent at 11/07/2022 11:53 AM EDT ----- Regarding: CXR results Please call radiology to check on status of XR interpretation from last week. Thanks!

## 2022-12-01 ENCOUNTER — Ambulatory Visit (INDEPENDENT_AMBULATORY_CARE_PROVIDER_SITE_OTHER): Payer: Medicare Other | Admitting: Physician Assistant

## 2022-12-01 ENCOUNTER — Encounter: Payer: Self-pay | Admitting: Physician Assistant

## 2022-12-01 VITALS — BP 108/74 | HR 61 | Temp 98.1°F | Ht 68.0 in | Wt 176.0 lb

## 2022-12-01 DIAGNOSIS — R053 Chronic cough: Secondary | ICD-10-CM | POA: Diagnosis not present

## 2022-12-01 DIAGNOSIS — Z2820 Immunization not carried out because of patient decision for unspecified reason: Secondary | ICD-10-CM | POA: Diagnosis not present

## 2022-12-01 DIAGNOSIS — M170 Bilateral primary osteoarthritis of knee: Secondary | ICD-10-CM

## 2022-12-01 DIAGNOSIS — M8580 Other specified disorders of bone density and structure, unspecified site: Secondary | ICD-10-CM

## 2022-12-01 DIAGNOSIS — Z780 Asymptomatic menopausal state: Secondary | ICD-10-CM

## 2022-12-01 NOTE — Progress Notes (Signed)
Date:  12/01/2022   Name:  Colleen Price   DOB:  03-Jan-1956   MRN:  [id number]   Chief Complaint: Cough (Feels better) and Off balance (X 2-3 weeks,Comes and goes, head feels heavy, has had a headache, took tylenol, pt stated she just doesn't feel right )  HPI Colleen Price presents for 1 month follow-up on suspected allergic cough, which has improved since starting OTC loratadine, which she has been taking as needed.   We obtained x-rays of her knees last visit which revealed moderate arthritis, and shortly after our visit she went to an orthopedist to have steroid injection of both knees which was performed on the same day, roughly 3-4 weeks ago.  Fortunately she received much relief from these injections and states the knees have not been causing her any pain recently.  A week after the injections she began to experience some cramping, headaches, nonspecific changes like "brain fog", unsure if related.   Due for repeat DEXA due to osteopenia, currently supplementing with vitamin D and calcium, no significant weightbearing exercise as she has been limited due to knee pain in recent years.  Bit by a flying insect on the left arm yesterday while in the pool, it is improving with OTC topical Benadryl.  Thinks it was a Marketing executive.  Medication list has been reviewed and updated.  Current Meds  Medication Sig   acetaminophen (TYLENOL) 325 MG tablet Take by mouth.   ibuprofen (ADVIL) 200 MG tablet Take by mouth.   Multiple Vitamin (MULTI-VITAMIN) tablet Take 1 tablet by mouth daily.   [DISCONTINUED] desonide (DESOWEN) 0.05 % cream    [DISCONTINUED] Meloxicam (MOBIC PO) Take by mouth as needed.     Review of Systems  Constitutional:  Negative for fatigue and fever.  Respiratory:  Negative for chest tightness and shortness of breath.   Cardiovascular:  Negative for chest pain and palpitations.  Gastrointestinal:  Negative for abdominal pain.  Musculoskeletal:  Negative for arthralgias, gait  problem and joint swelling.  Skin:  Positive for wound (insect bite L arm).  Psychiatric/Behavioral:  Positive for decreased concentration.     Patient Active Problem List   Diagnosis Date Noted   Vaccine refused by patient 12/01/2022   Generalized osteoarthritis of multiple sites 11/03/2022   History of breast cancer in female 11/03/2022   Osteopenia after menopause 11/03/2022   History of anemia 11/03/2022   Persistent cough 11/03/2022   Multilevel degenerative disc disease 10/17/2021   Osteoarthritis of knee 10/17/2021   Tear of medial meniscus of knee, current 10/17/2021   Primary localized osteoarthritis of pelvic region and thigh 10/17/2021    Allergies  Allergen Reactions   Tape Rash and Dermatitis   Wound Dressing Adhesive Dermatitis   Conjugated Estrogens Nausea And Vomiting, Rash and Other (See Comments)    Lupus like symptoms  Lupus like symptoms  Lupus like symptoms   Latex Rash   Silicone Rash     There is no immunization history on file for this patient.  Past Surgical History:  Procedure Laterality Date   ABDOMINAL HYSTERECTOMY  1990   BREAST LUMPECTOMY WITH SENTINEL LYMPH NODE BIOPSY Right 10/14/2015   Procedure: BREAST LUMPECTOMY WITH SENTINEL LYMPH NODE BX;  Surgeon: Kieth Brightly, MD;  Location: [facility];  Service: General;  Laterality: Right;   CATARACT EXTRACTION W/PHACO Right 01/23/2018   Procedure: CATARACT EXTRACTION PHACO AND INTRAOCULAR LENS PLACEMENT (IOC)  RIGHT;  Surgeon: Lockie Mola, MD;  Location: [facility];  Service: Ophthalmology;  Laterality: Right;   CATARACT EXTRACTION W/PHACO Left 02/13/2018   Procedure: CATARACT EXTRACTION PHACO AND INTRAOCULAR LENS PLACEMENT (IOC) LEFT;  Surgeon: Lockie Mola, MD;  Location: [facility];  Service: Ophthalmology;  Laterality: Left;   COLONOSCOPY     KNEE ARTHROSCOPY      Social History   Tobacco Use   Smoking status: Never   Smokeless tobacco: Never   Vaping Use   Vaping Use: Never used  Substance Use Topics   Alcohol use: No    Alcohol/week: 0.0 standard drinks of alcohol   Drug use: No    Family History  Problem Relation Age of Onset   Transient ischemic attack Mother    Heart disease Father    Diabetes Father    Bladder Cancer Father    Breast cancer Sister         12/01/2022   11:07 AM 11/03/2022    2:23 PM  GAD 7 : Generalized Anxiety Score  Nervous, Anxious, on Edge 0 1  Control/stop worrying 1 0  Worry too much - different things 1 0  Trouble relaxing 1 2  Restless 0 1  Easily annoyed or irritable 0 0  Afraid - awful might happen 0 0  Total GAD 7 Score 3 4  Anxiety Difficulty Not difficult at all Not difficult at all       12/01/2022   11:06 AM 11/03/2022    2:22 PM 11/03/2022    1:54 PM  Depression screen PHQ 2/9  Decreased Interest 0 0 0  Down, Depressed, Hopeless 1 1 0  PHQ - 2 Score 1 1 0  Altered sleeping 0 0   Tired, decreased energy 2 3   Change in appetite 0 0   Feeling bad or failure about yourself  0 0   Trouble concentrating 1 1   Moving slowly or fidgety/restless 0 0   Suicidal thoughts 0 0   PHQ-9 Score 4 5   Difficult doing work/chores Not difficult at all Somewhat difficult     BP Readings from Last 3 Encounters:  12/01/22 108/74  11/03/22 126/84  11/03/22 126/84    Wt Readings from Last 3 Encounters:  12/01/22 176 lb (79.8 kg)  11/03/22 182 lb (82.6 kg)  11/03/22 182 lb (82.6 kg)    BP 108/74   Pulse 61   Temp 98.1 F (36.7 C) (Oral)   Ht 5\' 8"  (1.727 m)   Wt 176 lb (79.8 kg)   SpO2 96%   BMI 26.76 kg/m   Physical Exam Vitals and nursing note reviewed.  Constitutional:      Appearance: Normal appearance.  Cardiovascular:     Rate and Rhythm: Normal rate.  Pulmonary:     Effort: Pulmonary effort is normal.  Abdominal:     General: There is no distension.  Musculoskeletal:        General: Normal range of motion.  Skin:    General: Skin is warm and dry.      Comments: 4 cm area of superficial erythema and localized edema medial aspect left anterior elbow not involving the joint.  Not tender or fluctuant.  Neurological:     Mental Status: She is alert and oriented to person, place, and time.     Gait: Gait is intact.  Psychiatric:        Mood and Affect: Mood and affect normal.     Recent Labs     Component Value Date/Time   NA 142 02/23/2022  0000   K 4.1 02/23/2022 0000   CL 107 02/23/2022 0000   CO2 29 (A) 02/23/2022 0000   GLUCOSE 96 10/26/2018 1300   BUN 21 02/23/2022 0000   CREATININE 0.6 02/23/2022 0000   CREATININE 0.73 10/26/2018 1300   CALCIUM 9.4 02/23/2022 0000   PROT 7.2 10/26/2018 1300   PROT 7.0 09/27/2015 1451   ALBUMIN 4.1 10/26/2018 1300   ALBUMIN 4.4 09/27/2015 1451   AST 20 10/26/2018 1300   ALT 20 10/26/2018 1300   ALKPHOS 69 10/26/2018 1300   BILITOT 0.8 10/26/2018 1300   BILITOT 0.5 09/27/2015 1451   GFRNONAA >60 10/26/2018 1300   GFRAA >60 10/26/2018 1300    Lab Results  Component Value Date   WBC 8.9 11/03/2022   HGB 12.9 11/03/2022   HCT 38.4 11/03/2022   MCV 86 11/03/2022   PLT 288 11/03/2022   No results found for: "HGBA1C" Lab Results  Component Value Date   CHOL 191 02/23/2022   HDL 55 02/23/2022   LDLCALC 119 02/23/2022   TRIG 84 02/23/2022   Lab Results  Component Value Date   TSH 1.253 10/26/2018     Assessment and Plan:  1. Persistent cough Much improved.  Continue OTC antihistamine as desired.  Advised these medications typically work best when taken daily, but she may use as needed if preferred  2. Primary osteoarthritis of both knees Seems like she has gotten significant relief from recent steroid injections in bilateral knees. Continue care with them for ongoing management of knee pain.   Nonspecific side effects seem to coincide with injection, probably soon to resolve, patient reassured. Advised to stay hydrated especially in the summer months, reviewed proper urine  color.   3. Osteopenia after menopause Patient agrees to reorder of DEXA scan today to evaluate for any changes since her last scan in 2011 - DG Bone Density  4. Vaccine refused by patient We briefly discussed vaccines today, patient generally refuses preventive care including vaccines.  Current recommendations suggest she is due for Tdap, Shingrix, Prevnar, colonoscopy.  She refuses all of the above.  Might consider Prevnar in the future given her history of recurrent bronchitis and lung issues - I think this would be a great step.    Return in about 6 months (around 06/02/2023) for CPE.   Partially dictated using Animal nutritionist. Any errors are unintentional.  Alvester Morin, PA-C, DMSc, Nutritionist [facility] [phone]

## 2022-12-06 ENCOUNTER — Telehealth: Payer: Self-pay

## 2022-12-06 NOTE — Telephone Encounter (Signed)
Called pt as a reminder to call and schedule an appointment fr her bone density Gave pt the number [phone].  KP

## 2022-12-27 ENCOUNTER — Other Ambulatory Visit: Payer: Medicare Other

## 2023-01-02 ENCOUNTER — Ambulatory Visit
Admission: RE | Admit: 2023-01-02 | Discharge: 2023-01-02 | Disposition: A | Discharge status: Home / Self Care | Payer: Medicare Other | Source: Ambulatory Visit | Attending: Physician Assistant | Admitting: Physician Assistant

## 2023-01-02 DIAGNOSIS — Z780 Asymptomatic menopausal state: Secondary | ICD-10-CM | POA: Insufficient documentation

## 2023-01-02 DIAGNOSIS — M8580 Other specified disorders of bone density and structure, unspecified site: Secondary | ICD-10-CM | POA: Diagnosis not present

## 2023-02-21 ENCOUNTER — Telehealth: Payer: Self-pay

## 2023-02-21 NOTE — Telephone Encounter (Signed)
Called pt left VM to call back.   Needs to schedule appt for medical clearance.  KP

## 2023-03-06 ENCOUNTER — Encounter: Payer: Self-pay | Admitting: Physician Assistant

## 2023-03-06 ENCOUNTER — Ambulatory Visit (INDEPENDENT_AMBULATORY_CARE_PROVIDER_SITE_OTHER): Payer: Medicare Other | Admitting: Physician Assistant

## 2023-03-06 VITALS — BP 122/78 | HR 55 | Temp 97.8°F | Ht 68.0 in | Wt 185.0 lb

## 2023-03-06 DIAGNOSIS — Z01818 Encounter for other preprocedural examination: Secondary | ICD-10-CM

## 2023-03-06 DIAGNOSIS — M170 Bilateral primary osteoarthritis of knee: Secondary | ICD-10-CM

## 2023-03-06 NOTE — Patient Instructions (Signed)
-  It was a pleasure to see you today! Please review your visit summary for helpful information -I would encourage you to follow your care via MyChart where you can access lab results, notes, messages, and more -If you feel that we did a nice job today, please complete your after-visit survey and leave [place] a Google review! Your CMA today was [place] and your provider was Alvester Morin, PA-C, DMSc

## 2023-03-06 NOTE — Progress Notes (Signed)
Date:  03/06/2023   Name:  Colleen Price   DOB:  [birth year]   MRN:  [id number]   Chief Complaint: Pre-op Exam (R knee TKA)  HPI Colleen Price presents today for preoperative clearance planning right knee TKA with [facility], surgical date not yet scheduled.  She tells me they have done MRIs of both knees and the right knee has "bone-on-bone" arthritis on the left is not far behind.  Has been using meloxicam as needed but last night she had severe bilateral knee pain almost brought her to the hospital.  Took some meloxicam and feels better today.  She expresses some reservations about TKA.  No history of MI, arrhythmia, or thrombosis.  She does have osteopenia.   Medication list has been reviewed and updated.  Current Meds  Medication Sig   acetaminophen (TYLENOL) 325 MG tablet Take by mouth.   ibuprofen (ADVIL) 200 MG tablet Take by mouth.   meloxicam (MOBIC) 15 MG tablet Take 15 mg by mouth daily.   Multiple Vitamin (MULTI-VITAMIN) tablet Take 1 tablet by mouth daily.     Review of Systems  Constitutional:  Negative for fatigue and fever.  Respiratory:  Negative for chest tightness and shortness of breath.   Cardiovascular:  Negative for chest pain and palpitations.  Gastrointestinal:  Negative for abdominal pain.    Patient Active Problem List   Diagnosis Date Noted   Vaccine refused by patient 12/01/2022   Generalized osteoarthritis of multiple sites 11/03/2022   History of breast cancer in female 11/03/2022   Osteopenia of neck of femur 11/03/2022   History of anemia 11/03/2022   Persistent cough 11/03/2022   Multilevel degenerative disc disease 10/17/2021   Osteoarthritis of knee 10/17/2021   Tear of medial meniscus of knee, current 10/17/2021   Primary localized osteoarthritis of pelvic region and thigh 10/17/2021    Allergies  Allergen Reactions   Tape Rash and Dermatitis   Wound Dressing Adhesive Dermatitis   Conjugated Estrogens Nausea And Vomiting,  Rash and Other (See Comments)    Lupus like symptoms  Lupus like symptoms  Lupus like symptoms   Latex Rash   Silicone Rash     There is no immunization history on file for this patient.  Past Surgical History:  Procedure Laterality Date   ABDOMINAL HYSTERECTOMY  1990   BREAST LUMPECTOMY WITH SENTINEL LYMPH NODE BIOPSY Right 10/14/2015   Procedure: BREAST LUMPECTOMY WITH SENTINEL LYMPH NODE BX;  Surgeon: Kieth Brightly, MD;  Location: [facility];  Service: General;  Laterality: Right;   CATARACT EXTRACTION W/PHACO Right 01/23/2018   Procedure: CATARACT EXTRACTION PHACO AND INTRAOCULAR LENS PLACEMENT (IOC)  RIGHT;  Surgeon: Lockie Mola, MD;  Location: [facility];  Service: Ophthalmology;  Laterality: Right;   CATARACT EXTRACTION W/PHACO Left 02/13/2018   Procedure: CATARACT EXTRACTION PHACO AND INTRAOCULAR LENS PLACEMENT (IOC) LEFT;  Surgeon: Lockie Mola, MD;  Location: [facility];  Service: Ophthalmology;  Laterality: Left;   COLONOSCOPY     KNEE ARTHROSCOPY      Social History   Tobacco Use   Smoking status: Never   Smokeless tobacco: Never  Vaping Use   Vaping status: Never Used  Substance Use Topics   Alcohol use: No    Alcohol/week: 0.0 standard drinks of alcohol   Drug use: No    Family History  Problem Relation Age of Onset   Transient ischemic attack Mother    Heart disease Father    Diabetes Father  Bladder Cancer Father    Breast cancer Sister         03/06/2023   10:44 AM 12/01/2022   11:07 AM 11/03/2022    2:23 PM  GAD 7 : Generalized Anxiety Score  Nervous, Anxious, on Edge 2 0 1  Control/stop worrying 1 1 0  Worry too much - different things 1 1 0  Trouble relaxing 1 1 2   Restless 1 0 1  Easily annoyed or irritable 0 0 0  Afraid - awful might happen 0 0 0  Total GAD 7 Score 6 3 4   Anxiety Difficulty Not difficult at all Not difficult at all Not difficult at all       03/06/2023   10:44 AM 12/01/2022    11:06 AM 11/03/2022    2:22 PM  Depression screen PHQ 2/9  Decreased Interest 2 0 0  Down, Depressed, Hopeless 2 1 1   PHQ - 2 Score 4 1 1   Altered sleeping 1 0 0  Tired, decreased energy 2 2 3   Change in appetite 0 0 0  Feeling bad or failure about yourself  0 0 0  Trouble concentrating 0 1 1  Moving slowly or fidgety/restless 2 0 0  Suicidal thoughts 0 0 0  PHQ-9 Score 9 4 5   Difficult doing work/chores Not difficult at all Not difficult at all Somewhat difficult    BP Readings from Last 3 Encounters:  03/06/23 122/78  12/01/22 108/74  11/03/22 126/84    Wt Readings from Last 3 Encounters:  03/06/23 185 lb (83.9 kg)  12/01/22 176 lb (79.8 kg)  11/03/22 182 lb (82.6 kg)    BP 122/78   Pulse (!) 55   Temp 97.8 F (36.6 C) (Oral)   Ht 5\' 8"  (1.727 m)   Wt 185 lb (83.9 kg)   SpO2 99%   BMI 28.13 kg/m   Physical Exam Vitals and nursing note reviewed.  Constitutional:      Appearance: Normal appearance.  Neck:     Vascular: No carotid bruit.  Cardiovascular:     Rate and Rhythm: Normal rate and regular rhythm.     Heart sounds: No murmur heard.    No friction rub. No gallop.  Pulmonary:     Effort: Pulmonary effort is normal.     Breath sounds: Normal breath sounds.  Abdominal:     General: There is no distension.  Musculoskeletal:        General: Normal range of motion.     Comments: Bilateral knees with preserved ROM, mild edema without overlying erythema or warmth.  Gait normal.  Skin:    General: Skin is warm and dry.  Neurological:     Mental Status: She is alert and oriented to person, place, and time.     Gait: Gait is intact.  Psychiatric:        Mood and Affect: Mood and affect normal.     Recent Labs     Component Value Date/Time   NA 142 02/23/2022 0000   K 4.1 02/23/2022 0000   CL 107 02/23/2022 0000   CO2 29 (A) 02/23/2022 0000   GLUCOSE 96 10/26/2018 1300   BUN 21 02/23/2022 0000   CREATININE 0.6 02/23/2022 0000   CREATININE 0.73  10/26/2018 1300   CALCIUM 9.4 02/23/2022 0000   PROT 7.2 10/26/2018 1300   PROT 7.0 09/27/2015 1451   ALBUMIN 4.1 10/26/2018 1300   ALBUMIN 4.4 09/27/2015 1451   AST 20 10/26/2018 1300  ALT 20 10/26/2018 1300   ALKPHOS 69 10/26/2018 1300   BILITOT 0.8 10/26/2018 1300   BILITOT 0.5 09/27/2015 1451   GFRNONAA >60 10/26/2018 1300   GFRAA >60 10/26/2018 1300    Lab Results  Component Value Date   WBC 8.9 11/03/2022   HGB 12.9 11/03/2022   HCT 38.4 11/03/2022   MCV 86 11/03/2022   PLT 288 11/03/2022   No results found for: "HGBA1C" Lab Results  Component Value Date   CHOL 191 02/23/2022   HDL 55 02/23/2022   LDLCALC 119 02/23/2022   TRIG 84 02/23/2022   Lab Results  Component Value Date   TSH 1.253 10/26/2018     Assessment and Plan:  1. Preoperative clearance Patient is cleared to proceed with right TKA as planned.  At this time she is felt to be of low surgical risk.  Labs to be completed by surgical team.  2. Primary osteoarthritis of both knees Discussed pros and cons of proceeding with TKA.  If she truly has bone-on-bone arthritis, her options for management are limited.  He has already tried topical and oral anti-inflammatories, intra-articular injections, and physical therapy.  At this time I encouraged her to proceed with TKA.   F/u PRN   Alvester Morin, PA-C, DMSc, Nutritionist [facility] [phone]

## 2023-04-09 ENCOUNTER — Ambulatory Visit: Payer: Medicare Other | Attending: Orthopedic Surgery | Admitting: Physical Therapy

## 2023-04-09 DIAGNOSIS — M25561 Pain in right knee: Secondary | ICD-10-CM

## 2023-04-09 DIAGNOSIS — R262 Difficulty in walking, not elsewhere classified: Secondary | ICD-10-CM

## 2023-04-09 DIAGNOSIS — M25661 Stiffness of right knee, not elsewhere classified: Secondary | ICD-10-CM | POA: Diagnosis present

## 2023-04-09 DIAGNOSIS — M6281 Muscle weakness (generalized): Secondary | ICD-10-CM

## 2023-04-09 NOTE — Therapy (Unsigned)
OUTPATIENT PHYSICAL THERAPY KNEE POST-OP EVALUATION  Patient Name: Colleen Price MRN: [id number] DOB:[birth year], 67 y.o., female Today's Date: 04/09/2023  END OF SESSION:  PT End of Session - 04/09/23 1034     Visit Number 1    Number of Visits 17    Date for PT Re-Evaluation 06/04/23    Authorization Type IE 04/09/23    PT Start Time 1032    PT Stop Time 1115    PT Time Calculation (min) 43 min    Equipment Utilized During Treatment --   front-wheeled walker for gait   Activity Tolerance Patient tolerated treatment well    Behavior During Therapy WFL for tasks assessed/performed             Past Medical History:  Diagnosis Date   Arthritis    KNEES   Cancer (HCC)    Complication of anesthesia    PONV (postoperative nausea and vomiting)    PT STATES SCOPALAMINE PATCH HELPED WITH HER LAST SURGERY   Past Surgical History:  Procedure Laterality Date   ABDOMINAL HYSTERECTOMY  1990   BREAST LUMPECTOMY WITH SENTINEL LYMPH NODE BIOPSY Right 10/14/2015   Procedure: BREAST LUMPECTOMY WITH SENTINEL LYMPH NODE BX;  Surgeon: Kieth Brightly, MD;  Location: [facility];  Service: General;  Laterality: Right;   CATARACT EXTRACTION W/PHACO Right 01/23/2018   Procedure: CATARACT EXTRACTION PHACO AND INTRAOCULAR LENS PLACEMENT (IOC)  RIGHT;  Surgeon: Lockie Mola, MD;  Location: [facility];  Service: Ophthalmology;  Laterality: Right;   CATARACT EXTRACTION W/PHACO Left 02/13/2018   Procedure: CATARACT EXTRACTION PHACO AND INTRAOCULAR LENS PLACEMENT (IOC) LEFT;  Surgeon: Lockie Mola, MD;  Location: [facility];  Service: Ophthalmology;  Laterality: Left;   COLONOSCOPY     KNEE ARTHROSCOPY     Patient Active Problem List   Diagnosis Date Noted   Vaccine refused by patient 12/01/2022   Generalized osteoarthritis of multiple sites 11/03/2022   History of breast cancer in female 11/03/2022   Osteopenia of neck of femur 11/03/2022   History of anemia  11/03/2022   Persistent cough 11/03/2022   Multilevel degenerative disc disease 10/17/2021   Osteoarthritis of knee 10/17/2021   Tear of medial meniscus of knee, current 10/17/2021   Primary localized osteoarthritis of pelvic region and thigh 10/17/2021    PCP: Remo Lipps, PA  REFERRING PROVIDER: Sheral Apley, MD  REFERRING DIAG: [phone] (ICD-10-CM) - S/P TKR (total knee replacement), righ   RATIONALE FOR EVALUATION AND TREATMENT: Rehabilitation  THERAPY DIAG: Acute pain of right knee  Stiffness of right knee, not elsewhere classified  Difficulty in walking, not elsewhere classified  Muscle weakness (generalized)  ONSET DATE: R TKA, DOS 04/05/23  FOLLOW-UP APPT SCHEDULED WITH REFERRING PROVIDER: Yes ; 04/19/23   SUBJECTIVE:  SUBJECTIVE STATEMENT:  Pt is a 67 year old female s/p R TKA 04/05/23  PERTINENT HISTORY: Pt is a 67 year old female s/p R TKA 04/05/23. Patient reports no significant post-op complications. She and her husband report being off of pain medicine regimen last evening and this AM - more pain this AM due to this. Pt is using ice machine at home often. Patient is using FWW for mobility at the time. Pt reports difficulty with moving LE to/from bed. Patient reports she is doing better with getting onto commode.   PAIN:   Pain Intensity: Present: 7/10, Best: /10, Worst: /10 Pain location: R knee Pain quality: sharp and burning   Swelling: Yes ; expected post-op swelling Numbness/Tingling: Yes; some anterior knee/peri-incisional sensory change Focal weakness or buckling: Yes; weakness in antigravity mm for RLE Aggravating factors: attempting to lift surgical LE on/off bed, car transfers Relieving factors: pain medicine, Tylenol, ice History of prior back, hip, or knee  injury, pain, surgery, or therapy: Yes; Hx of meniscus tear with arthroscopy/partial meniscectomy   Imaging: No  Prior level of function: Independent with community mobility without device Occupational demands: Retired Presenter, broadcasting: Clinical cytogeneticist, gardening/flower bed, active lifestyle   Red flags: Negative for personal history of cancer, chills/fever, night sweats, nausea, vomiting, unexplained weight gain/loss, unrelenting pain  PRECAUTIONS: None  WEIGHT BEARING RESTRICTIONS: Yes WBAT  FALLS: Has patient fallen in last 6 months? No  Living Environment Lives with: lives with their spouse; her sister lives locally and is able to help Lives in: House/apartment 2 steps to get into home - step down into family room (2 steps) - no handrail for home entrance; home is one level. [place]vel to walk into home; concrete sidewalk to get to/from car.   Patient Goals: Able to get back to normal community-level mobility, dec R knee pain   OBJECTIVE:   Patient Surveys  FOTO 21, predicted outcome score of 83  Cognition Patient is oriented to person, place, and time.  Recent memory is intact.  Remote memory is intact.  Attention span and concentration are intact.  Expressive speech is intact.  Patient's fund of knowledge is within normal limits for educational level.    Gross Musculoskeletal Assessment Tremor: None Bulk: R quadriceps atrophy Tone: Normal  GAIT: Distance walked: 30 ft Assistive device utilized: Walker - 2 wheeled Level of assistance: CGA Comments: Decreased R knee terminal extension at terminal swing   AROM AROM (Normal range in degrees) AROM  Hip Right Left  Flexion (125)    Extension (15)    Abduction (40)    Adduction     Internal Rotation (45)    External Rotation (45)        Knee    Flexion (135) 81 130  Extension (0) -3 +3      Ankle    Dorsiflexion (20) WNL WNL  Plantarflexion (50)    Inversion (35)    Eversion (15    (* = pain; Blank rows = not  tested)  LE MMT: MMT (out of 5) Right Left  Hip flexion 2 4  Hip extension    Hip abduction (seated) 5 5  Hip adduction (seated) 5 5  Hip internal rotation    Hip external rotation    Knee flexion 4 5  Knee extension 2 4  Ankle dorsiflexion    Ankle plantarflexion    Ankle inversion    Ankle eversion    (* = pain; Blank rows = not tested)  Sensation Only altered sensation peri-incisional region  Reflexes Deferred  Muscle Length Deferred  Palpation Location LEFT  RIGHT           Quadriceps    Medial Hamstrings    Lateral Hamstrings    Lateral Hamstring tendon    Medial Hamstring tendon    Quadriceps tendon  0  Patella  1  Patellar Tendon  1  Tibial Tuberosity  1  Medial joint line  2  Lateral joint line  2  MCL    LCL    Adductor Tubercle    Pes Anserine tendon    Infrapatellar fat pad    Fibular head    Popliteal fossa    (Blank rows = not tested) Graded on 0-4 scale (0 = no pain, 1 = pain, 2 = pain with wincing/grimacing/flinching, 3 = pain with withdrawal, 4 = unwilling to allow palpation), (Blank rows = not tested)  Passive Accessory Motion Deferred   VASCULAR Dorsalis pedis and posterior tibial pulses are palpable No sign of acute DVT   SPECIAL TESTS  Homan's Sign: Negative     TODAY'S TREATMENT:   Therapeutic Exercise - for HEP establishment, discussion on appropriate exercise/activity modification, PT education   Reviewed baseline home exercises and provided handout for Med[employer] program (see Access Code); tactile cueing and therapist demonstration utilized as needed for carryover of proper technique to HEP.    Patient education on current condition, role of PT, prognosis, plan of care. Discussed DVT prophylaxis.    PATIENT EDUCATION:  Education details: see above for patient education details Person educated: Patient Education method: Explanation, Demonstration, and Handouts Education comprehension: verbalized understanding and  returned demonstration   HOME EXERCISE PROGRAM:  Access Code: [email or web address] URL: [email or web address] Date: 04/09/2023 Prepared by: Consuela Mimes  Exercises - Supine Quad Set  - 3 x daily - 7 x weekly - 2 sets - 10 reps - 5sec hold - Supine Heel Slide with Strap  - 2 x daily - 7 x weekly - 2 sets - 10 reps - 5sec hold - Supine Ankle Pumps  - 2 x daily - 7 x weekly - 2 sets - 10 reps     ASSESSMENT:  CLINICAL IMPRESSION: Patient is a 67 y.o. female who was seen today for physical therapy evaluation and treatment s/p R TKA on 04/05/23.   OBJECTIVE IMPAIRMENTS: Abnormal gait, decreased balance, difficulty walking, decreased ROM, decreased strength, hypomobility, increased edema, impaired flexibility, and pain.   ACTIVITY LIMITATIONS: carrying, lifting, bending, sitting, standing, squatting, stairs, transfers, bed mobility, toileting, dressing, and locomotion level  PARTICIPATION LIMITATIONS: meal prep, cleaning, laundry, driving, shopping, community activity, and crafting/gardening  PERSONAL FACTORS: Age and involved orthopedic history are also affecting patient's functional outcome.   REHAB POTENTIAL: Good  CLINICAL DECISION MAKING: Evolving/moderate complexity  EVALUATION COMPLEXITY: Moderate   GOALS: Goals reviewed with patient? Yes  SHORT TERM GOALS: Target date: 05/03/2023  Pt will be independent with HEP to improve strength and decrease knee pain to improve pain-free function at home and work. Baseline: 04/09/23: Baseline HEP initiated Goal status: INITIAL   LONG TERM GOALS: Target date: 06/07/2023  Pt will increase FOTO to at least 51 to demonstrate significant improvement in function at home and work related to knee pain  Baseline: 04/09/23: 21 Goal status: INITIAL  2.  Pt will decrease worst knee pain by at least 3 points on the NPRS in order to demonstrate clinically significant reduction in knee pain. Baseline: *** Goal status: INITIAL  3.   Pt will decrease LEFS score by  at least 9 points in order demonstrate clinically significant reduction in knee pain/disability.       Baseline: *** Goal status: INITIAL  4.  Pt will increase strength of *** by at least 1/2 MMT grade in order to demonstrate improvement in strength and function  Baseline: *** Goal status: INITIAL   PLAN: PT FREQUENCY: 1-2x/week  PT DURATION: 8 weeks  PLANNED INTERVENTIONS: Therapeutic exercises, Therapeutic activity, Neuromuscular re-education, Balance training, Gait training, Patient/Family education, Self Care, Joint mobilization, Orthotic/Fit training, DME instructions, Electrical Stimulation, Cryotherapy, Manual therapy, and Re-evaluation.  PLAN FOR NEXT SESSION: ***   Consuela Mimes, PT, DPT [phone]  Gertie Exon, PT 04/09/2023, 10:35 AM

## 2023-04-11 ENCOUNTER — Encounter: Payer: Self-pay | Admitting: Physical Therapy

## 2023-04-11 ENCOUNTER — Ambulatory Visit: Payer: Medicare Other | Admitting: Physical Therapy

## 2023-04-11 DIAGNOSIS — R262 Difficulty in walking, not elsewhere classified: Secondary | ICD-10-CM

## 2023-04-11 DIAGNOSIS — M25661 Stiffness of right knee, not elsewhere classified: Secondary | ICD-10-CM

## 2023-04-11 DIAGNOSIS — M25561 Pain in right knee: Secondary | ICD-10-CM | POA: Diagnosis not present

## 2023-04-11 DIAGNOSIS — M6281 Muscle weakness (generalized): Secondary | ICD-10-CM

## 2023-04-11 NOTE — Therapy (Signed)
OUTPATIENT PHYSICAL THERAPY KNEE POST-OP TREATMENT  Patient Name: Colleen Price MRN: [id number] DOB:June 08, 1956, 67 y.o., female Today's Date: 04/11/2023  END OF SESSION:  PT End of Session - 04/11/23 1028     Visit Number 2    Number of Visits 17    Date for PT Re-Evaluation 06/04/23    Authorization Type IE 04/09/23    PT Start Time 1030    PT Stop Time 1113    PT Time Calculation (min) 43 min    Equipment Utilized During Treatment --   front-wheeled walker for gait   Activity Tolerance Patient tolerated treatment well    Behavior During Therapy WFL for tasks assessed/performed              Past Medical History:  Diagnosis Date   Arthritis    KNEES   Cancer (HCC)    Complication of anesthesia    PONV (postoperative nausea and vomiting)    PT STATES SCOPALAMINE PATCH HELPED WITH HER LAST SURGERY   Past Surgical History:  Procedure Laterality Date   ABDOMINAL HYSTERECTOMY  1990   BREAST LUMPECTOMY WITH SENTINEL LYMPH NODE BIOPSY Right 10/14/2015   Procedure: BREAST LUMPECTOMY WITH SENTINEL LYMPH NODE BX;  Surgeon: Kieth Brightly, MD;  Location: [facility];  Service: General;  Laterality: Right;   CATARACT EXTRACTION W/PHACO Right 01/23/2018   Procedure: CATARACT EXTRACTION PHACO AND INTRAOCULAR LENS PLACEMENT (IOC)  RIGHT;  Surgeon: Lockie Mola, MD;  Location: [facility];  Service: Ophthalmology;  Laterality: Right;   CATARACT EXTRACTION W/PHACO Left 02/13/2018   Procedure: CATARACT EXTRACTION PHACO AND INTRAOCULAR LENS PLACEMENT (IOC) LEFT;  Surgeon: Lockie Mola, MD;  Location: [facility];  Service: Ophthalmology;  Laterality: Left;   COLONOSCOPY     KNEE ARTHROSCOPY     Patient Active Problem List   Diagnosis Date Noted   Vaccine refused by patient 12/01/2022   Generalized osteoarthritis of multiple sites 11/03/2022   History of breast cancer in female 11/03/2022   Osteopenia of neck of femur 11/03/2022   History of anemia  11/03/2022   Persistent cough 11/03/2022   Multilevel degenerative disc disease 10/17/2021   Osteoarthritis of knee 10/17/2021   Tear of medial meniscus of knee, current 10/17/2021   Primary localized osteoarthritis of pelvic region and thigh 10/17/2021    PCP: Remo Lipps, PA  REFERRING PROVIDER: Sheral Apley, MD  REFERRING DIAG: [phone] (ICD-10-CM) - S/P TKR (total knee replacement), righ   RATIONALE FOR EVALUATION AND TREATMENT: Rehabilitation  THERAPY DIAG: Acute pain of right knee  Stiffness of right knee, not elsewhere classified  Difficulty in walking, not elsewhere classified  Muscle weakness (generalized)  ONSET DATE: R TKA, DOS 04/05/23  FOLLOW-UP APPT SCHEDULED WITH REFERRING PROVIDER: Yes ; 04/19/23  PERTINENT HISTORY: Pt is a 67 year old female s/p R TKA 04/05/23. Patient reports no significant post-op complications. She and her husband report being off of pain medicine regimen last evening and this AM - more pain this AM due to this. Pt is using ice machine at home often. Patient is using FWW for mobility at the time. Pt reports difficulty with moving LE to/from bed. Patient reports she is doing better with getting onto commode at this time.   PAIN:   Pain Intensity: Present: 7/10, Worst: 9-10/10 Pain location: R knee Pain quality: sharp and burning   Swelling: Yes ; expected post-op swelling Numbness/Tingling: Yes; some anterior knee/peri-incisional sensory change Focal weakness or buckling: Yes; weakness in antigravity mm for  RLE Aggravating factors: attempting to lift surgical LE on/off bed, car transfers Relieving factors: pain medicine, Tylenol, ice History of prior back, hip, or knee injury, pain, surgery, or therapy: Yes; Hx of meniscus tear with arthroscopy/partial meniscectomy   Imaging: No  Prior level of function: Independent with community mobility without device Occupational demands: Retired Presenter, broadcasting: Clinical cytogeneticist, gardening/flower bed,  active lifestyle   Red flags: Negative for personal history of cancer, chills/fever, night sweats, nausea, vomiting, unexplained weight gain/loss, unrelenting pain  PRECAUTIONS: None  WEIGHT BEARING RESTRICTIONS: Yes WBAT  FALLS: Has patient fallen in last 6 months? No  Living Environment Lives with: lives with their spouse; her sister lives locally and is able to help Lives in: House/apartment 2 steps to get into home - step down into family room (2 steps) - no handrail for home entrance; home is one level. [place] to walk into home; concrete sidewalk to get to/from car.   Patient Goals: Able to get back to normal community-level mobility, dec R knee pain   OBJECTIVE:    Gross Musculoskeletal Assessment Tremor: None Bulk: R quadriceps atrophy Tone: Normal  GAIT: Distance walked: 30 ft Assistive device utilized: Walker - 2 wheeled Level of assistance: CGA Comments: Decreased R knee terminal extension at terminal swing   AROM AROM (Normal range in degrees) AROM 04/09/23  Hip Right Left  Flexion (125)    Extension (15)    Abduction (40)    Adduction     Internal Rotation (45)    External Rotation (45)        Knee    Flexion (135) 81 130  Extension (0) -3 +3      Ankle    Dorsiflexion (20) WNL WNL  Plantarflexion (50)    Inversion (35)    Eversion (15    (* = pain; Blank rows = not tested)  LE MMT: MMT (out of 5) Right Left  Hip flexion 2 4  Hip extension    Hip abduction (seated) 5 5  Hip adduction (seated) 5 5  Hip internal rotation    Hip external rotation    Knee flexion 4 5  Knee extension 2 4  Ankle dorsiflexion    Ankle plantarflexion    Ankle inversion    Ankle eversion    (* = pain; Blank rows = not tested)  Sensation Only altered sensation peri-incisional region  Reflexes Deferred  Muscle Length Deferred  Palpation Location LEFT  RIGHT           Quadriceps    Medial Hamstrings    Lateral Hamstrings    Lateral Hamstring tendon     Medial Hamstring tendon    Quadriceps tendon  0  Patella  1  Patellar Tendon  1  Tibial Tuberosity  1  Medial joint line  2  Lateral joint line  2  MCL    LCL    Adductor Tubercle    Pes Anserine tendon    Infrapatellar fat pad    Fibular head    Popliteal fossa    (Blank rows = not tested) Graded on 0-4 scale (0 = no pain, 1 = pain, 2 = pain with wincing/grimacing/flinching, 3 = pain with withdrawal, 4 = unwilling to allow palpation), (Blank rows = not tested)     TODAY'S TREATMENT:   SUBJECTIVE STATEMENT:   Pt is still having notable challenges with pain control in early post-op phase. She is compliant with HEP.    Manual Therapy - for symptom modulation, soft  tissue sensitivity and mobility, joint mobility, ROM   Patellar mobilization 2 x 30 sec bouts, emphasis on superior and inferior glides R knee PROM as tolerated; x 5 minutes into flexion and extension in supine Tibiofemoral mobilization; 2 x 30 second bouts each; gr II for pain control and gr III for mobility    Therapeutic Exercise - for improved soft tissue flexibility and extensibility as needed for ROM, improved strength as needed to improve performance of CKC activities/functional movements   Quad set; 2 x 10, 5 sec hold  Heel slide with bedsheet; x15, 5 sec hold Ball squeeze hip adductor isometric; 2 x 10 5 sec Supine hip abduction, Blue Tband; 2x10   PATIENT EDUCATION: Discussed expected progression over first 2 weeks following surgery and typical observations seen in pain status and swelling through successive weeks.   PATIENT EDUCATION:  Education details: see above for patient education details Person educated: Patient Education method: Explanation, Demonstration, and Handouts Education comprehension: verbalized understanding and returned demonstration   HOME EXERCISE PROGRAM:  Access Code: [email or web address] URL: [email or web address] Date: 04/09/2023 Prepared by: Consuela Mimes  Exercises - Supine Quad Set  - 3 x daily - 7 x weekly - 2 sets - 10 reps - 5sec hold - Supine Heel Slide with Strap  - 2 x daily - 7 x weekly - 2 sets - 10 reps - 5sec hold - Supine Ankle Pumps  - 2 x daily - 7 x weekly - 2 sets - 10 reps     ASSESSMENT:  CLINICAL IMPRESSION: Patient is making fair progress in early post-op phase. She has good terminal knee extension and is progressing with knee flexion ROM. Pt is having ongoing difficulties with pain control. We discussed use of ice, TENS, pain medication as discussed by referring physician's office, and elevation for swelling. Pt has expected post-op impairments in R knee ROM, decreased quadriceps activation and decreased LE strength, R knee complex stiffness, post-op pain, post-op edema, and dec RLE weightbearing tolerance. Pt will continue to benefit from skilled PT services to address deficits and improve function.   OBJECTIVE IMPAIRMENTS: Abnormal gait, decreased balance, difficulty walking, decreased ROM, decreased strength, hypomobility, increased edema, impaired flexibility, and pain.   ACTIVITY LIMITATIONS: carrying, lifting, bending, sitting, standing, squatting, stairs, transfers, bed mobility, toileting, dressing, and locomotion level  PARTICIPATION LIMITATIONS: meal prep, cleaning, laundry, driving, shopping, community activity, and crafting/gardening  PERSONAL FACTORS: Age and involved orthopedic history are also affecting patient's functional outcome.   REHAB POTENTIAL: Good  CLINICAL DECISION MAKING: Evolving/moderate complexity  EVALUATION COMPLEXITY: Moderate   GOALS: Goals reviewed with patient? Yes  SHORT TERM GOALS: Target date: 05/03/2023  Pt will be independent with HEP to improve strength and decrease knee pain to improve pain-free function at home and work. Baseline: 04/09/23: Baseline HEP initiated Goal status: INITIAL  Pt will improve knee flexion ROM to 0-120 deg as needed for improved  ability to perform transferring from surfaces of various height, self-care ADLs e.g. dressing/hygiene Baseline: 04/09/23: AROM -3-81 Goal status: INITIAL  LONG TERM GOALS: Target date: 06/07/2023  Pt will increase FOTO to at least 51 to demonstrate significant improvement in function at home and work related to knee pain  Baseline: 04/09/23: 21 Goal status: INITIAL  2.  Pt will decrease worst knee pain by at least 3 points on the NPRS in order to demonstrate clinically significant reduction in knee pain. Baseline: 04/09/23: 9-10/10 at worst Goal status: INITIAL  3.  Pt will ambulate with no AD for  660 ft or greater without significant gait deviation, LOB, or increase in knee pain > 1-2/10 as needed for community-level gait      Baseline: 04/09/23: Limited household distance tolerated, using FWW at this time.  Goal status: INITIAL  4.  Pt will increase strength of tested LE musculature to 4+/5 MMT grade or greater in order to demonstrate improvement in strength and function  Baseline: 04/09/23: Hip flexor and knee strength 2-4/5, good seated hip ABD/ADD.  Goal status: INITIAL   PLAN: PT FREQUENCY: 1-2x/week  PT DURATION: 8 weeks  PLANNED INTERVENTIONS: Therapeutic exercises, Therapeutic activity, Neuromuscular re-education, Balance training, Gait training, Patient/Family education, Self Care, Joint mobilization, Orthotic/Fit training, DME instructions, Electrical Stimulation, Cryotherapy, Manual therapy, and Re-evaluation.  PLAN FOR NEXT SESSION: Progress with knee ROM as tolerated, quadriceps isometrics and lying open-chain isotonics, progressive weightbearing/stepping activity as tolerated. Check patellar mobility. Swelling/edema/pain control in early post-op phase prn.    Consuela Mimes, PT, DPT #[id number]  Gertie Exon, PT 04/11/2023, 10:31 AM

## 2023-04-16 ENCOUNTER — Encounter: Payer: Self-pay | Admitting: Physical Therapy

## 2023-04-16 ENCOUNTER — Ambulatory Visit: Payer: Medicare Other | Attending: Orthopedic Surgery | Admitting: Physical Therapy

## 2023-04-16 DIAGNOSIS — M25661 Stiffness of right knee, not elsewhere classified: Secondary | ICD-10-CM | POA: Diagnosis present

## 2023-04-16 DIAGNOSIS — R262 Difficulty in walking, not elsewhere classified: Secondary | ICD-10-CM | POA: Insufficient documentation

## 2023-04-16 DIAGNOSIS — M6281 Muscle weakness (generalized): Secondary | ICD-10-CM | POA: Diagnosis present

## 2023-04-16 DIAGNOSIS — M25561 Pain in right knee: Secondary | ICD-10-CM | POA: Insufficient documentation

## 2023-04-16 NOTE — Therapy (Signed)
OUTPATIENT PHYSICAL THERAPY KNEE POST-OP TREATMENT  Patient Name: Colleen Price MRN: [id number] DOB:July 27, 1955, 67 y.o., female Today's Date: 04/16/2023  END OF SESSION:  PT End of Session - 04/16/23 1020     Visit Number 3    Number of Visits 17    Date for PT Re-Evaluation 06/04/23    Authorization Type IE 04/09/23    PT Start Time 1034    PT Stop Time 1120    PT Time Calculation (min) 46 min    Equipment Utilized During Treatment --   front-wheeled walker for gait   Activity Tolerance Patient tolerated treatment well    Behavior During Therapy WFL for tasks assessed/performed               Past Medical History:  Diagnosis Date   Arthritis    KNEES   Cancer (HCC)    Complication of anesthesia    PONV (postoperative nausea and vomiting)    PT STATES SCOPALAMINE PATCH HELPED WITH HER LAST SURGERY   Past Surgical History:  Procedure Laterality Date   ABDOMINAL HYSTERECTOMY  1990   BREAST LUMPECTOMY WITH SENTINEL LYMPH NODE BIOPSY Right 10/14/2015   Procedure: BREAST LUMPECTOMY WITH SENTINEL LYMPH NODE BX;  Surgeon: Kieth Brightly, MD;  Location: [facility];  Service: General;  Laterality: Right;   CATARACT EXTRACTION W/PHACO Right 01/23/2018   Procedure: CATARACT EXTRACTION PHACO AND INTRAOCULAR LENS PLACEMENT (IOC)  RIGHT;  Surgeon: Lockie Mola, MD;  Location: [facility];  Service: Ophthalmology;  Laterality: Right;   CATARACT EXTRACTION W/PHACO Left 02/13/2018   Procedure: CATARACT EXTRACTION PHACO AND INTRAOCULAR LENS PLACEMENT (IOC) LEFT;  Surgeon: Lockie Mola, MD;  Location: [facility];  Service: Ophthalmology;  Laterality: Left;   COLONOSCOPY     KNEE ARTHROSCOPY     Patient Active Problem List   Diagnosis Date Noted   Vaccine refused by patient 12/01/2022   Generalized osteoarthritis of multiple sites 11/03/2022   History of breast cancer in female 11/03/2022   Osteopenia of neck of femur 11/03/2022   History of anemia  11/03/2022   Persistent cough 11/03/2022   Multilevel degenerative disc disease 10/17/2021   Osteoarthritis of knee 10/17/2021   Tear of medial meniscus of knee, current 10/17/2021   Primary localized osteoarthritis of pelvic region and thigh 10/17/2021    PCP: Remo Lipps, PA  REFERRING PROVIDER: Remo Lipps, PA  REFERRING DIAG: [phone] (ICD-10-CM) - S/P TKR (total knee replacement), righ   RATIONALE FOR EVALUATION AND TREATMENT: Rehabilitation  THERAPY DIAG: Acute pain of right knee  Stiffness of right knee, not elsewhere classified  Difficulty in walking, not elsewhere classified  Muscle weakness (generalized)  ONSET DATE: R TKA, DOS 04/05/23  FOLLOW-UP APPT SCHEDULED WITH REFERRING PROVIDER: Yes ; 04/19/23  PERTINENT HISTORY: Pt is a 67 year old female s/p R TKA 04/05/23. Patient reports no significant post-op complications. She and her husband report being off of pain medicine regimen last evening and this AM - more pain this AM due to this. Pt is using ice machine at home often. Patient is using FWW for mobility at the time. Pt reports difficulty with moving LE to/from bed. Patient reports she is doing better with getting onto commode at this time.   PAIN:   Pain Intensity: Present: 7/10, Worst: 9-10/10 Pain location: R knee Pain quality: sharp and burning   Swelling: Yes ; expected post-op swelling Numbness/Tingling: Yes; some anterior knee/peri-incisional sensory change Focal weakness or buckling: Yes; weakness in antigravity mm  for RLE Aggravating factors: attempting to lift surgical LE on/off bed, car transfers Relieving factors: pain medicine, Tylenol, ice History of prior back, hip, or knee injury, pain, surgery, or therapy: Yes; Hx of meniscus tear with arthroscopy/partial meniscectomy   Imaging: No  Prior level of function: Independent with community mobility without device Occupational demands: Retired Presenter, broadcasting: Clinical cytogeneticist, gardening/flower bed,  active lifestyle   Red flags: Negative for personal history of cancer, chills/fever, night sweats, nausea, vomiting, unexplained weight gain/loss, unrelenting pain  PRECAUTIONS: None  WEIGHT BEARING RESTRICTIONS: Yes WBAT  FALLS: Has patient fallen in last 6 months? No  Living Environment Lives with: lives with their spouse; her sister lives locally and is able to help Lives in: House/apartment 2 steps to get into home - step down into family room (2 steps) - no handrail for home entrance; home is one level. [place] to walk into home; concrete sidewalk to get to/from car.   Patient Goals: Able to get back to normal community-level mobility, dec R knee pain   OBJECTIVE:    Gross Musculoskeletal Assessment Tremor: None Bulk: R quadriceps atrophy Tone: Normal  GAIT: Distance walked: 30 ft Assistive device utilized: Walker - 2 wheeled Level of assistance: CGA Comments: Decreased R knee terminal extension at terminal swing   AROM AROM (Normal range in degrees) AROM 04/09/23  Hip Right Left  Flexion (125)    Extension (15)    Abduction (40)    Adduction     Internal Rotation (45)    External Rotation (45)        Knee    Flexion (135) 81 130  Extension (0) -3 +3      Ankle    Dorsiflexion (20) WNL WNL  Plantarflexion (50)    Inversion (35)    Eversion (15    (* = pain; Blank rows = not tested)  LE MMT: MMT (out of 5) Right Left  Hip flexion 2 4  Hip extension    Hip abduction (seated) 5 5  Hip adduction (seated) 5 5  Hip internal rotation    Hip external rotation    Knee flexion 4 5  Knee extension 2 4  Ankle dorsiflexion    Ankle plantarflexion    Ankle inversion    Ankle eversion    (* = pain; Blank rows = not tested)  Sensation Only altered sensation peri-incisional region  Reflexes Deferred  Muscle Length Deferred  Palpation Location LEFT  RIGHT           Quadriceps    Medial Hamstrings    Lateral Hamstrings    Lateral Hamstring tendon     Medial Hamstring tendon    Quadriceps tendon  0  Patella  1  Patellar Tendon  1  Tibial Tuberosity  1  Medial joint line  2  Lateral joint line  2  MCL    LCL    Adductor Tubercle    Pes Anserine tendon    Infrapatellar fat pad    Fibular head    Popliteal fossa    (Blank rows = not tested) Graded on 0-4 scale (0 = no pain, 1 = pain, 2 = pain with wincing/grimacing/flinching, 3 = pain with withdrawal, 4 = unwilling to allow palpation), (Blank rows = not tested)     TODAY'S TREATMENT:   SUBJECTIVE STATEMENT:   Pt reports 5/10 pain at arrival. She reports toward end of last week, she had severe R calf pain and called her physician's office. They discussed signs  of DVT for which to monitor. Pt was also given advice on self-massage for R calf and she feels that this helped. This pain has improved, but pt still has mild pain along medial calf. Pt reports she is mostly compliant with HEP, she may have not completed full daily volume of exercise on some days due to pain.    Manual Therapy - for symptom modulation, soft tissue sensitivity and mobility, joint mobility, ROM   Patellar mobilization 2 x 30 sec bouts, emphasis on superior and inferior glides R knee PROM as tolerated; x 6 minutes into flexion and extension in supine with gentle overpressure into flexion as tolerated STM R medial calf; x  5 minutes with pt in hooklying Tibiofemoral mobilization; 2 x 30 second bouts each; gr II for pain control and gr III for mobility  R knee PROM -2-97 deg    Therapeutic Exercise - for improved soft tissue flexibility and extensibility as needed for ROM, improved strength as needed to improve performance of CKC activities/functional movements   Heel slide with bedsheet; 2x10, 5 sec hold Short arc quad with green bolster; 1x10, 3 sec hold  Ball squeeze hip adductor isometric; 2 x 10 5 sec Supine hip abduction, Blue Tband; 2x10    *next visit* // bars: Forward and retro step with  bilat UE support on bars // bars: Sidestep; 3x D/B    PATIENT EDUCATION: Discussed expected progression in PT and continued work on [employer].     Cold pack (unbilled) - for anti-inflammatory and analgesic effect as needed for reduced pain and improved ability to participate in active PT intervention, along R knee in supine with pillow under calf, x 5 minutes    PATIENT EDUCATION:  Education details: see above for patient education details Person educated: Patient Education method: Explanation, Demonstration, and Handouts Education comprehension: verbalized understanding and returned demonstration   HOME EXERCISE PROGRAM:  Access Code: [email or web address] URL: [email or web address] Date: 04/09/2023 Prepared by: Consuela Mimes  Exercises - Supine Quad Set  - 3 x daily - 7 x weekly - 2 sets - 10 reps - 5sec hold - Supine Heel Slide with Strap  - 2 x daily - 7 x weekly - 2 sets - 10 reps - 5sec hold - Supine Ankle Pumps  - 2 x daily - 7 x weekly - 2 sets - 10 reps     ASSESSMENT:  CLINICAL IMPRESSION: Patient is progressing well with knee ROM; she minimal terminal knee extension deficit and has knee flexion ROM just under 100 deg. Pt does still have considerable post-operative pain which is common at this time post-operatively. We utilized cold pack and allowed rest period at end of session for analgesic effect; pt does have some soreness with RLE lying flat for several minutes. Pt has expected post-op impairments in R knee ROM, decreased quadriceps activation and decreased LE strength, R knee complex stiffness, post-op pain, post-op edema, and dec RLE weightbearing tolerance. Pt will continue to benefit from skilled PT services to address deficits and improve function.   OBJECTIVE IMPAIRMENTS: Abnormal gait, decreased balance, difficulty walking, decreased ROM, decreased strength, hypomobility, increased edema, impaired flexibility, and pain.   ACTIVITY LIMITATIONS: carrying,  lifting, bending, sitting, standing, squatting, stairs, transfers, bed mobility, toileting, dressing, and locomotion level  PARTICIPATION LIMITATIONS: meal prep, cleaning, laundry, driving, shopping, community activity, and crafting/gardening  PERSONAL FACTORS: Age and involved orthopedic history are also affecting patient's functional outcome.   REHAB POTENTIAL: Good  CLINICAL DECISION MAKING: Evolving/moderate complexity  EVALUATION  COMPLEXITY: Moderate   GOALS: Goals reviewed with patient? Yes  SHORT TERM GOALS: Target date: 05/03/2023  Pt will be independent with HEP to improve strength and decrease knee pain to improve pain-free function at home and work. Baseline: 04/09/23: Baseline HEP initiated Goal status: INITIAL  Pt will improve knee flexion ROM to 0-120 deg as needed for improved ability to perform transferring from surfaces of various height, self-care ADLs e.g. dressing/hygiene Baseline: 04/09/23: AROM -3-81 Goal status: INITIAL  LONG TERM GOALS: Target date: 06/07/2023  Pt will increase FOTO to at least 51 to demonstrate significant improvement in function at home and work related to knee pain  Baseline: 04/09/23: 21 Goal status: INITIAL  2.  Pt will decrease worst knee pain by at least 3 points on the NPRS in order to demonstrate clinically significant reduction in knee pain. Baseline: 04/09/23: 9-10/10 at worst Goal status: INITIAL  3.  Pt will ambulate with no AD for 660 ft or greater without significant gait deviation, LOB, or increase in knee pain > 1-2/10 as needed for community-level gait      Baseline: 04/09/23: Limited household distance tolerated, using FWW at this time.  Goal status: INITIAL  4.  Pt will increase strength of tested LE musculature to 4+/5 MMT grade or greater in order to demonstrate improvement in strength and function  Baseline: 04/09/23: Hip flexor and knee strength 2-4/5, good seated hip ABD/ADD.  Goal status:  INITIAL   PLAN: PT FREQUENCY: 1-2x/week  PT DURATION: 8 weeks  PLANNED INTERVENTIONS: Therapeutic exercises, Therapeutic activity, Neuromuscular re-education, Balance training, Gait training, Patient/Family education, Self Care, Joint mobilization, Orthotic/Fit training, DME instructions, Electrical Stimulation, Cryotherapy, Manual therapy, and Re-evaluation.  PLAN FOR NEXT SESSION: Progress with knee ROM as tolerated, quadriceps isometrics and lying open-chain isotonics, progressive weightbearing/stepping activity as tolerated. Check patellar mobility. Swelling/edema/pain control in early post-op phase prn.    Consuela Mimes, PT, DPT #[id number]  Gertie Exon, PT 04/16/2023, 1:53 PM

## 2023-04-18 ENCOUNTER — Ambulatory Visit: Payer: Medicare Other | Admitting: Physical Therapy

## 2023-04-18 DIAGNOSIS — M25561 Pain in right knee: Secondary | ICD-10-CM | POA: Diagnosis not present

## 2023-04-18 DIAGNOSIS — R262 Difficulty in walking, not elsewhere classified: Secondary | ICD-10-CM

## 2023-04-18 DIAGNOSIS — M25661 Stiffness of right knee, not elsewhere classified: Secondary | ICD-10-CM

## 2023-04-18 DIAGNOSIS — M6281 Muscle weakness (generalized): Secondary | ICD-10-CM

## 2023-04-18 NOTE — Therapy (Signed)
OUTPATIENT PHYSICAL THERAPY KNEE POST-OP TREATMENT  Patient Name: Colleen Price MRN: [id number] DOB:September 07, 1955, 67 y.o., female Today's Date: 04/18/2023  END OF SESSION:  PT End of Session - 04/18/23 1033     Visit Number 4    Number of Visits 17    Date for PT Re-Evaluation 06/04/23    Authorization Type IE 04/09/23    PT Start Time 1031    PT Stop Time 1113    PT Time Calculation (min) 42 min    Equipment Utilized During Treatment --   front-wheeled walker for gait   Activity Tolerance Patient tolerated treatment well    Behavior During Therapy WFL for tasks assessed/performed                Past Medical History:  Diagnosis Date   Arthritis    KNEES   Cancer (HCC)    Complication of anesthesia    PONV (postoperative nausea and vomiting)    PT STATES SCOPALAMINE PATCH HELPED WITH HER LAST SURGERY   Past Surgical History:  Procedure Laterality Date   ABDOMINAL HYSTERECTOMY  1990   BREAST LUMPECTOMY WITH SENTINEL LYMPH NODE BIOPSY Right 10/14/2015   Procedure: BREAST LUMPECTOMY WITH SENTINEL LYMPH NODE BX;  Surgeon: Kieth Brightly, MD;  Location: [facility];  Service: General;  Laterality: Right;   CATARACT EXTRACTION W/PHACO Right 01/23/2018   Procedure: CATARACT EXTRACTION PHACO AND INTRAOCULAR LENS PLACEMENT (IOC)  RIGHT;  Surgeon: Lockie Mola, MD;  Location: [facility];  Service: Ophthalmology;  Laterality: Right;   CATARACT EXTRACTION W/PHACO Left 02/13/2018   Procedure: CATARACT EXTRACTION PHACO AND INTRAOCULAR LENS PLACEMENT (IOC) LEFT;  Surgeon: Lockie Mola, MD;  Location: [facility];  Service: Ophthalmology;  Laterality: Left;   COLONOSCOPY     KNEE ARTHROSCOPY     Patient Active Problem List   Diagnosis Date Noted   Vaccine refused by patient 12/01/2022   Generalized osteoarthritis of multiple sites 11/03/2022   History of breast cancer in female 11/03/2022   Osteopenia of neck of femur 11/03/2022   History of  anemia 11/03/2022   Persistent cough 11/03/2022   Multilevel degenerative disc disease 10/17/2021   Osteoarthritis of knee 10/17/2021   Tear of medial meniscus of knee, current 10/17/2021   Primary localized osteoarthritis of pelvic region and thigh 10/17/2021    PCP: Remo Lipps, PA  REFERRING PROVIDER: Sheral Apley, MD  REFERRING DIAG: [phone] (ICD-10-CM) - S/P TKR (total knee replacement), righ   RATIONALE FOR EVALUATION AND TREATMENT: Rehabilitation  THERAPY DIAG: Acute pain of right knee  Stiffness of right knee, not elsewhere classified  Difficulty in walking, not elsewhere classified  Muscle weakness (generalized)  ONSET DATE: R TKA, DOS 04/05/23  FOLLOW-UP APPT SCHEDULED WITH REFERRING PROVIDER: Yes ; 04/19/23  PERTINENT HISTORY: Pt is a 67 year old female s/p R TKA 04/05/23. Patient reports no significant post-op complications. She and her husband report being off of pain medicine regimen last evening and this AM - more pain this AM due to this. Pt is using ice machine at home often. Patient is using FWW for mobility at the time. Pt reports difficulty with moving LE to/from bed. Patient reports she is doing better with getting onto commode at this time.   PAIN:   Pain Intensity: Present: 7/10, Worst: 9-10/10 Pain location: R knee Pain quality: sharp and burning   Swelling: Yes ; expected post-op swelling Numbness/Tingling: Yes; some anterior knee/peri-incisional sensory change Focal weakness or buckling: Yes; weakness in antigravity  mm for RLE Aggravating factors: attempting to lift surgical LE on/off bed, car transfers Relieving factors: pain medicine, Tylenol, ice History of prior back, hip, or knee injury, pain, surgery, or therapy: Yes; Hx of meniscus tear with arthroscopy/partial meniscectomy   Imaging: No  Prior level of function: Independent with community mobility without device Occupational demands: Retired Presenter, broadcasting: Clinical cytogeneticist, gardening/flower  bed, active lifestyle   Red flags: Negative for personal history of cancer, chills/fever, night sweats, nausea, vomiting, unexplained weight gain/loss, unrelenting pain  PRECAUTIONS: None  WEIGHT BEARING RESTRICTIONS: Yes WBAT  FALLS: Has patient fallen in last 6 months? No  Living Environment Lives with: lives with their spouse; her sister lives locally and is able to help Lives in: House/apartment 2 steps to get into home - step down into family room (2 steps) - no handrail for home entrance; home is one level. [place] to walk into home; concrete sidewalk to get to/from car.   Patient Goals: Able to get back to normal community-level mobility, dec R knee pain   OBJECTIVE:    Gross Musculoskeletal Assessment Tremor: None Bulk: R quadriceps atrophy Tone: Normal  GAIT: Distance walked: 30 ft Assistive device utilized: Walker - 2 wheeled Level of assistance: CGA Comments: Decreased R knee terminal extension at terminal swing   AROM AROM (Normal range in degrees) AROM 04/09/23  Hip Right Left  Flexion (125)    Extension (15)    Abduction (40)    Adduction     Internal Rotation (45)    External Rotation (45)        Knee    Flexion (135) 81 130  Extension (0) -3 +3      Ankle    Dorsiflexion (20) WNL WNL  Plantarflexion (50)    Inversion (35)    Eversion (15    (* = pain; Blank rows = not tested)  LE MMT: MMT (out of 5) Right Left  Hip flexion 2 4  Hip extension    Hip abduction (seated) 5 5  Hip adduction (seated) 5 5  Hip internal rotation    Hip external rotation    Knee flexion 4 5  Knee extension 2 4  Ankle dorsiflexion    Ankle plantarflexion    Ankle inversion    Ankle eversion    (* = pain; Blank rows = not tested)  Sensation Only altered sensation peri-incisional region  Reflexes Deferred  Muscle Length Deferred  Palpation Location LEFT  RIGHT           Quadriceps    Medial Hamstrings    Lateral Hamstrings    Lateral Hamstring  tendon    Medial Hamstring tendon    Quadriceps tendon  0  Patella  1  Patellar Tendon  1  Tibial Tuberosity  1  Medial joint line  2  Lateral joint line  2  MCL    LCL    Adductor Tubercle    Pes Anserine tendon    Infrapatellar fat pad    Fibular head    Popliteal fossa    (Blank rows = not tested) Graded on 0-4 scale (0 = no pain, 1 = pain, 2 = pain with wincing/grimacing/flinching, 3 = pain with withdrawal, 4 = unwilling to allow palpation), (Blank rows = not tested)     TODAY'S TREATMENT:   SUBJECTIVE STATEMENT:   Pt reports 5/10 pain at arrival along anterior knee/suprapatellar region. She reports compliance with HEP. She is concerned with making slow progress with her TKA in  regard to pain improving and ROM.    Manual Therapy - for symptom modulation, soft tissue sensitivity and mobility, joint mobility, ROM   Patellar mobilization 2 x 30 sec bouts, emphasis on superior and inferior glides R knee PROM as tolerated; x 6 minutes into flexion and extension in supine with gentle overpressure into flexion as tolerated STM R medial calf; x  5 minutes with pt in hooklying Tibiofemoral mobilization; 2 x 30 second bouts each; gr II for pain control and gr III for mobility  R knee PROM 0-100 deg    Therapeutic Exercise - for improved soft tissue flexibility and extensibility as needed for ROM, improved strength as needed to improve performance of CKC activities/functional movements  Supine quad set; 1 x 10, 5 sec hold  SLR; 2 x 4  Short arc quad with green bolster; 1x10, 3 sec hold  Long arc quad, edge of treatment table; 2x10, 3 sec hold    Ambulate laps around gym x 2 with PT instruction on heel to toe reciprocal pattern with continuous forward progression of FWW Standing march, in walker; 2x10 alternating R/L   *next visit* // bars: Forward and retro step with bilat UE support on bars // bars: Sidestep; 3x D/B    PATIENT EDUCATION: Discussed expected  progression in PT and continued work on [employer]. HEP updated and reviewed.    *not today* Heel slide with bedsheet; 2x10, 5 sec hold Ball squeeze hip adductor isometric; 2 x 10 5 sec Supine hip abduction, Blue Tband; 2x10  Cold pack (unbilled) - for anti-inflammatory and analgesic effect as needed for reduced pain and improved ability to participate in active PT intervention, along R knee in supine with pillow under calf, x 5 minutes    PATIENT EDUCATION:  Education details: see above for patient education details Person educated: Patient Education method: Explanation, Demonstration, and Handouts Education comprehension: verbalized understanding and returned demonstration   HOME EXERCISE PROGRAM:  Access Code: [email or web address] URL: [email or web address] Date: 04/18/2023 Prepared by: Consuela Mimes  Exercises - Supine Quad Set  - 3 x daily - 7 x weekly - 2 sets - 10 reps - 5sec hold - Supine Heel Slide with Strap  - 2 x daily - 7 x weekly - 2 sets - 10 reps - 5sec hold - Seated Long Arc Quad  - 2 x daily - 7 x weekly - 2 sets - 10 reps - Supine Ankle Pumps  - 3 x daily - 7 x weekly - 2 sets - 10 reps   ASSESSMENT:  CLINICAL IMPRESSION: Patient is progressing appropriately with knee ROM given current post-operative time. She is about 2 weeks post-op and is still having notable pain with end-range knee flexion and is continuing Rx medication and staggering OTC meds for pain control. She's experienced some tenderness and tightness around medial calf, but no other notable signs of DVT are present at this time; we recommended following up with referring office regarding calf pain. Pt has expected post-op impairments in R knee ROM, decreased quadriceps activation and decreased LE strength, R knee complex stiffness, post-op pain, post-op edema, and dec RLE weightbearing tolerance. Pt will continue to benefit from skilled PT services to address deficits and improve  function.   OBJECTIVE IMPAIRMENTS: Abnormal gait, decreased balance, difficulty walking, decreased ROM, decreased strength, hypomobility, increased edema, impaired flexibility, and pain.   ACTIVITY LIMITATIONS: carrying, lifting, bending, sitting, standing, squatting, stairs, transfers, bed mobility, toileting, dressing, and locomotion level  PARTICIPATION LIMITATIONS: meal prep, cleaning, laundry,  driving, shopping, community activity, and crafting/gardening  PERSONAL FACTORS: Age and involved orthopedic history are also affecting patient's functional outcome.   REHAB POTENTIAL: Good  CLINICAL DECISION MAKING: Evolving/moderate complexity  EVALUATION COMPLEXITY: Moderate   GOALS: Goals reviewed with patient? Yes  SHORT TERM GOALS: Target date: 05/03/2023  Pt will be independent with HEP to improve strength and decrease knee pain to improve pain-free function at home and work. Baseline: 04/09/23: Baseline HEP initiated Goal status: INITIAL  Pt will improve knee flexion ROM to 0-120 deg as needed for improved ability to perform transferring from surfaces of various height, self-care ADLs e.g. dressing/hygiene Baseline: 04/09/23: AROM -3-81 Goal status: INITIAL  LONG TERM GOALS: Target date: 06/07/2023  Pt will increase FOTO to at least 51 to demonstrate significant improvement in function at home and work related to knee pain  Baseline: 04/09/23: 21 Goal status: INITIAL  2.  Pt will decrease worst knee pain by at least 3 points on the NPRS in order to demonstrate clinically significant reduction in knee pain. Baseline: 04/09/23: 9-10/10 at worst Goal status: INITIAL  3.  Pt will ambulate with no AD for 660 ft or greater without significant gait deviation, LOB, or increase in knee pain > 1-2/10 as needed for community-level gait      Baseline: 04/09/23: Limited household distance tolerated, using FWW at this time.  Goal status: INITIAL  4.  Pt will increase strength of  tested LE musculature to 4+/5 MMT grade or greater in order to demonstrate improvement in strength and function  Baseline: 04/09/23: Hip flexor and knee strength 2-4/5, good seated hip ABD/ADD.  Goal status: INITIAL   PLAN: PT FREQUENCY: 1-2x/week  PT DURATION: 8 weeks  PLANNED INTERVENTIONS: Therapeutic exercises, Therapeutic activity, Neuromuscular re-education, Balance training, Gait training, Patient/Family education, Self Care, Joint mobilization, Orthotic/Fit training, DME instructions, Electrical Stimulation, Cryotherapy, Manual therapy, and Re-evaluation.  PLAN FOR NEXT SESSION: Progress with knee ROM as tolerated, quadriceps isometrics and lying open-chain isotonics, progressive weightbearing/stepping activity as tolerated. Check patellar mobility. Swelling/edema/pain control in early post-op phase prn.    Consuela Mimes, PT, DPT #[id number]  Gertie Exon, PT 04/18/2023, 10:34 AM

## 2023-04-19 ENCOUNTER — Other Ambulatory Visit: Payer: Self-pay | Admitting: Orthopedic Surgery

## 2023-04-19 DIAGNOSIS — M79604 Pain in right leg: Secondary | ICD-10-CM

## 2023-04-20 ENCOUNTER — Ambulatory Visit
Admission: RE | Admit: 2023-04-20 | Discharge: 2023-04-20 | Disposition: A | Discharge status: Home / Self Care | Payer: Medicare Other | Source: Ambulatory Visit | Attending: Orthopedic Surgery

## 2023-04-20 DIAGNOSIS — M79604 Pain in right leg: Secondary | ICD-10-CM | POA: Diagnosis present

## 2023-04-20 DIAGNOSIS — M7989 Other specified soft tissue disorders: Secondary | ICD-10-CM | POA: Insufficient documentation

## 2023-04-23 ENCOUNTER — Ambulatory Visit: Payer: Medicare Other | Admitting: Physical Therapy

## 2023-04-23 ENCOUNTER — Encounter: Payer: Self-pay | Admitting: Physical Therapy

## 2023-04-23 DIAGNOSIS — R262 Difficulty in walking, not elsewhere classified: Secondary | ICD-10-CM

## 2023-04-23 DIAGNOSIS — M25561 Pain in right knee: Secondary | ICD-10-CM

## 2023-04-23 DIAGNOSIS — M25661 Stiffness of right knee, not elsewhere classified: Secondary | ICD-10-CM

## 2023-04-23 DIAGNOSIS — M6281 Muscle weakness (generalized): Secondary | ICD-10-CM

## 2023-04-23 NOTE — Therapy (Addendum)
OUTPATIENT PHYSICAL THERAPY KNEE POST-OP TREATMENT  Patient Name: Colleen Price MRN: [id number] DOB:09/30/1955, 67 y.o., female Today's Date: 04/23/2023  END OF SESSION:  PT End of Session - 04/23/23 1021     Visit Number 5    Number of Visits 17    Date for PT Re-Evaluation 06/04/23    Authorization Type IE 04/09/23    PT Start Time 1030    PT Stop Time 1114    PT Time Calculation (min) 44 min    Equipment Utilized During Treatment --   front-wheeled walker for gait   Activity Tolerance Patient tolerated treatment well    Behavior During Therapy WFL for tasks assessed/performed               Past Medical History:  Diagnosis Date   Arthritis    KNEES   Cancer (HCC)    Complication of anesthesia    PONV (postoperative nausea and vomiting)    PT STATES SCOPALAMINE PATCH HELPED WITH HER LAST SURGERY   Past Surgical History:  Procedure Laterality Date   ABDOMINAL HYSTERECTOMY  1990   BREAST LUMPECTOMY WITH SENTINEL LYMPH NODE BIOPSY Right 10/14/2015   Procedure: BREAST LUMPECTOMY WITH SENTINEL LYMPH NODE BX;  Surgeon: Kieth Brightly, MD;  Location: [facility];  Service: General;  Laterality: Right;   CATARACT EXTRACTION W/PHACO Right 01/23/2018   Procedure: CATARACT EXTRACTION PHACO AND INTRAOCULAR LENS PLACEMENT (IOC)  RIGHT;  Surgeon: Lockie Mola, MD;  Location: [facility];  Service: Ophthalmology;  Laterality: Right;   CATARACT EXTRACTION W/PHACO Left 02/13/2018   Procedure: CATARACT EXTRACTION PHACO AND INTRAOCULAR LENS PLACEMENT (IOC) LEFT;  Surgeon: Lockie Mola, MD;  Location: [facility];  Service: Ophthalmology;  Laterality: Left;   COLONOSCOPY     KNEE ARTHROSCOPY     Patient Active Problem List   Diagnosis Date Noted   Vaccine refused by patient 12/01/2022   Generalized osteoarthritis of multiple sites 11/03/2022   History of breast cancer in female 11/03/2022   Osteopenia of neck of femur 11/03/2022   History of anemia  11/03/2022   Persistent cough 11/03/2022   Multilevel degenerative disc disease 10/17/2021   Osteoarthritis of knee 10/17/2021   Tear of medial meniscus of knee, current 10/17/2021   Primary localized osteoarthritis of pelvic region and thigh 10/17/2021    PCP: Remo Lipps, PA  REFERRING PROVIDER: Sheral Apley, MD  REFERRING DIAG: [phone] (ICD-10-CM) - S/P TKR (total knee replacement), righ   RATIONALE FOR EVALUATION AND TREATMENT: Rehabilitation  THERAPY DIAG: Acute pain of right knee  Stiffness of right knee, not elsewhere classified  Difficulty in walking, not elsewhere classified  Muscle weakness (generalized)  ONSET DATE: R TKA, DOS 04/05/23  FOLLOW-UP APPT SCHEDULED WITH REFERRING PROVIDER: Yes ; 04/19/23  PERTINENT HISTORY: Pt is a 67 year old female s/p R TKA 04/05/23. Patient reports no significant post-op complications. She and her husband report being off of pain medicine regimen last evening and this AM - more pain this AM due to this. Pt is using ice machine at home often. Patient is using FWW for mobility at the time. Pt reports difficulty with moving LE to/from bed. Patient reports she is doing better with getting onto commode at this time.   PAIN:   Pain Intensity: Present: 7/10, Worst: 9-10/10 Pain location: R knee Pain quality: sharp and burning   Swelling: Yes ; expected post-op swelling Numbness/Tingling: Yes; some anterior knee/peri-incisional sensory change Focal weakness or buckling: Yes; weakness in antigravity mm  for RLE Aggravating factors: attempting to lift surgical LE on/off bed, car transfers Relieving factors: pain medicine, Tylenol, ice History of prior back, hip, or knee injury, pain, surgery, or therapy: Yes; Hx of meniscus tear with arthroscopy/partial meniscectomy   Imaging: No  Prior level of function: Independent with community mobility without device Occupational demands: Retired Presenter, broadcasting: Clinical cytogeneticist, gardening/flower bed,  active lifestyle   Red flags: Negative for personal history of cancer, chills/fever, night sweats, nausea, vomiting, unexplained weight gain/loss, unrelenting pain  PRECAUTIONS: None  WEIGHT BEARING RESTRICTIONS: Yes WBAT  FALLS: Has patient fallen in last 6 months? No  Living Environment Lives with: lives with their spouse; her sister lives locally and is able to help Lives in: House/apartment 2 steps to get into home - step down into family room (2 steps) - no handrail for home entrance; home is one level. [place] to walk into home; concrete sidewalk to get to/from car.   Patient Goals: Able to get back to normal community-level mobility, dec R knee pain   OBJECTIVE:    Gross Musculoskeletal Assessment Tremor: None Bulk: R quadriceps atrophy Tone: Normal  GAIT: Distance walked: 30 ft Assistive device utilized: Walker - 2 wheeled Level of assistance: CGA Comments: Decreased R knee terminal extension at terminal swing   AROM AROM (Normal range in degrees) AROM 04/09/23  Hip Right Left  Flexion (125)    Extension (15)    Abduction (40)    Adduction     Internal Rotation (45)    External Rotation (45)        Knee    Flexion (135) 81 130  Extension (0) -3 +3      Ankle    Dorsiflexion (20) WNL WNL  Plantarflexion (50)    Inversion (35)    Eversion (15    (* = pain; Blank rows = not tested)  LE MMT: MMT (out of 5) Right Left  Hip flexion 2 4  Hip extension    Hip abduction (seated) 5 5  Hip adduction (seated) 5 5  Hip internal rotation    Hip external rotation    Knee flexion 4 5  Knee extension 2 4  Ankle dorsiflexion    Ankle plantarflexion    Ankle inversion    Ankle eversion    (* = pain; Blank rows = not tested)  Sensation Only altered sensation peri-incisional region  Reflexes Deferred  Muscle Length Deferred  Palpation Location LEFT  RIGHT           Quadriceps    Medial Hamstrings    Lateral Hamstrings    Lateral Hamstring tendon     Medial Hamstring tendon    Quadriceps tendon  0  Patella  1  Patellar Tendon  1  Tibial Tuberosity  1  Medial joint line  2  Lateral joint line  2  MCL    LCL    Adductor Tubercle    Pes Anserine tendon    Infrapatellar fat pad    Fibular head    Popliteal fossa    (Blank rows = not tested) Graded on 0-4 scale (0 = no pain, 1 = pain, 2 = pain with wincing/grimacing/flinching, 3 = pain with withdrawal, 4 = unwilling to allow palpation), (Blank rows = not tested)     TODAY'S TREATMENT:   SUBJECTIVE STATEMENT:   Pt reports pain has modestly improved; she transitioned from hydrocodone to new Rx given for Tramadol; she did have one bad reaction with new Rx and cut  dosage in half with improved response. Pt reports compliance with HEP. She has walked some in her home with no AD, touching walls/furniture prn. Pt also had negative ultrasound at referring provider's office and ruled out DVT.    Manual Therapy - for symptom modulation, soft tissue sensitivity and mobility, joint mobility, ROM   Patellar mobilization 2 x 30 sec bouts, emphasis on superior and inferior glides R knee PROM as tolerated; x 6 minutes into flexion and extension in supine with gentle overpressure into flexion as tolerated STM and gentle IASTM with Theraband roller, R medial calf; x  5 minutes with pt in hooklying Tibiofemoral mobilization; 2 x 30 second bouts each; gr II for pain control and gr III for mobility  R knee PROM 0-110 deg    Therapeutic Exercise - for improved soft tissue flexibility and extensibility as needed for ROM, improved strength as needed to improve performance of CKC activities/functional movements  Short arc quad with green bolster; 2x10, 3 sec hold   -may progress load/weight next visit Long arc quad, edge of treatment table; 2x10, 3 sec hold   -with 4-lb ankle weight   // bars:  Forward and retro step with bilat UE support on bars; x 5 D/B length of parallel bars Dynamic march,  forward stepping only; 4x D/B  Ambulate laps around gym x 3 with PT instruction on use of SPC in LUE  -PT demonstration and intermittent verbal cueing for cane placement   PATIENT EDUCATION: Discussed expected progression in PT and improvement in pain/stiffness with successive weeks, and continued work on HEP.    *not today* Standing march, in walker; 2x10 alternating R/L Supine quad set; 1 x 10, 5 sec hold  SLR; 2 x 4  Heel slide with bedsheet; 2x10, 5 sec hold Ball squeeze hip adductor isometric; 2 x 10 5 sec Supine hip abduction, Blue Tband; 2x10  Cold pack (unbilled) - for anti-inflammatory and analgesic effect as needed for reduced pain and improved ability to participate in active PT intervention, along R knee in supine with pillow under calf, x 5 minutes    PATIENT EDUCATION:  Education details: see above for patient education details Person educated: Patient Education method: Explanation, Demonstration, and Handouts Education comprehension: verbalized understanding and returned demonstration   HOME EXERCISE PROGRAM:  Access Code: [email or web address] URL: [email or web address] Date: 04/18/2023 Prepared by: Consuela Mimes  Exercises - Supine Quad Set  - 3 x daily - 7 x weekly - 2 sets - 10 reps - 5sec hold - Supine Heel Slide with Strap  - 2 x daily - 7 x weekly - 2 sets - 10 reps - 5sec hold - Seated Long Arc Quad  - 2 x daily - 7 x weekly - 2 sets - 10 reps - Supine Ankle Pumps  - 3 x daily - 7 x weekly - 2 sets - 10 reps   ASSESSMENT:  CLINICAL IMPRESSION: Patient completed 3 laps in clinic (approximately 80 feet per lap) with SPC with reciprocal step-through pattern safely without LOB. We completed initial instruction on correct usage of SPC to offload R knee and provide added gait stability. Pt advised to continue use of walker for community-level gait, but she may practice with cane at home. She is progressing well with ROM and quad control for only being  2.5 weeks post-op. Pt has expected post-op impairments in R knee ROM, decreased quadriceps activation and decreased LE strength, R knee complex stiffness, post-op pain, post-op edema, and dec RLE weightbearing tolerance.  Pt will continue to benefit from skilled PT services to address deficits and improve function.   OBJECTIVE IMPAIRMENTS: Abnormal gait, decreased balance, difficulty walking, decreased ROM, decreased strength, hypomobility, increased edema, impaired flexibility, and pain.   ACTIVITY LIMITATIONS: carrying, lifting, bending, sitting, standing, squatting, stairs, transfers, bed mobility, toileting, dressing, and locomotion level  PARTICIPATION LIMITATIONS: meal prep, cleaning, laundry, driving, shopping, community activity, and crafting/gardening  PERSONAL FACTORS: Age and involved orthopedic history are also affecting patient's functional outcome.   REHAB POTENTIAL: Good  CLINICAL DECISION MAKING: Evolving/moderate complexity  EVALUATION COMPLEXITY: Moderate   GOALS: Goals reviewed with patient? Yes  SHORT TERM GOALS: Target date: 05/03/2023  Pt will be independent with HEP to improve strength and decrease knee pain to improve pain-free function at home and work. Baseline: 04/09/23: Baseline HEP initiated Goal status: INITIAL  Pt will improve knee flexion ROM to 0-120 deg as needed for improved ability to perform transferring from surfaces of various height, self-care ADLs e.g. dressing/hygiene Baseline: 04/09/23: AROM -3-81 Goal status: INITIAL  LONG TERM GOALS: Target date: 06/07/2023  Pt will increase FOTO to at least 51 to demonstrate significant improvement in function at home and work related to knee pain  Baseline: 04/09/23: 21 Goal status: INITIAL  2.  Pt will decrease worst knee pain by at least 3 points on the NPRS in order to demonstrate clinically significant reduction in knee pain. Baseline: 04/09/23: 9-10/10 at worst Goal status: INITIAL  3.  Pt  will ambulate with no AD for 660 ft or greater without significant gait deviation, LOB, or increase in knee pain > 1-2/10 as needed for community-level gait      Baseline: 04/09/23: Limited household distance tolerated, using FWW at this time.  Goal status: INITIAL  4.  Pt will increase strength of tested LE musculature to 4+/5 MMT grade or greater in order to demonstrate improvement in strength and function  Baseline: 04/09/23: Hip flexor and knee strength 2-4/5, good seated hip ABD/ADD.  Goal status: INITIAL   PLAN: PT FREQUENCY: 1-2x/week  PT DURATION: 8 weeks  PLANNED INTERVENTIONS: Therapeutic exercises, Therapeutic activity, Neuromuscular re-education, Balance training, Gait training, Patient/Family education, Self Care, Joint mobilization, Orthotic/Fit training, DME instructions, Electrical Stimulation, Cryotherapy, Manual therapy, and Re-evaluation.  PLAN FOR NEXT SESSION: Progress with knee ROM as tolerated, quadriceps isometrics and lying open-chain isotonics, progressive weightbearing/stepping activity as tolerated. Swelling/edema/pain control in early post-op phase prn.    Consuela Mimes, PT, DPT #[id number]  Gertie Exon, PT 04/23/2023, 1:53 PM

## 2023-04-25 ENCOUNTER — Ambulatory Visit: Payer: Medicare Other

## 2023-04-25 DIAGNOSIS — M25561 Pain in right knee: Secondary | ICD-10-CM

## 2023-04-25 DIAGNOSIS — M6281 Muscle weakness (generalized): Secondary | ICD-10-CM

## 2023-04-25 DIAGNOSIS — M25661 Stiffness of right knee, not elsewhere classified: Secondary | ICD-10-CM

## 2023-04-25 DIAGNOSIS — R262 Difficulty in walking, not elsewhere classified: Secondary | ICD-10-CM

## 2023-04-25 NOTE — Therapy (Signed)
OUTPATIENT PHYSICAL THERAPY TREATMENT  Patient Name: Colleen Price MRN: [id number] DOB:28-Apr-1956, 67 y.o., female Today's Date: 04/25/2023  END OF SESSION:  PT End of Session - 04/25/23 1035     Visit Number 6    Number of Visits 17    Date for PT Re-Evaluation 06/04/23    Authorization Type Medicare A&B; [employer] supplemental    Progress Note Due on Visit 10    PT Start Time 1030    PT Stop Time 1110    PT Time Calculation (min) 40 min    Activity Tolerance Patient tolerated treatment well;No increased pain    Behavior During Therapy WFL for tasks assessed/performed               Past Medical History:  Diagnosis Date   Arthritis    KNEES   Cancer (HCC)    Complication of anesthesia    PONV (postoperative nausea and vomiting)    PT STATES SCOPALAMINE PATCH HELPED WITH HER LAST SURGERY   Past Surgical History:  Procedure Laterality Date   ABDOMINAL HYSTERECTOMY  1990   BREAST LUMPECTOMY WITH SENTINEL LYMPH NODE BIOPSY Right 10/14/2015   Procedure: BREAST LUMPECTOMY WITH SENTINEL LYMPH NODE BX;  Surgeon: Kieth Brightly, MD;  Location: [facility];  Service: General;  Laterality: Right;   CATARACT EXTRACTION W/PHACO Right 01/23/2018   Procedure: CATARACT EXTRACTION PHACO AND INTRAOCULAR LENS PLACEMENT (IOC)  RIGHT;  Surgeon: Lockie Mola, MD;  Location: [facility];  Service: Ophthalmology;  Laterality: Right;   CATARACT EXTRACTION W/PHACO Left 02/13/2018   Procedure: CATARACT EXTRACTION PHACO AND INTRAOCULAR LENS PLACEMENT (IOC) LEFT;  Surgeon: Lockie Mola, MD;  Location: [facility];  Service: Ophthalmology;  Laterality: Left;   COLONOSCOPY     KNEE ARTHROSCOPY     Patient Active Problem List   Diagnosis Date Noted   Vaccine refused by patient 12/01/2022   Generalized osteoarthritis of multiple sites 11/03/2022   History of breast cancer in female 11/03/2022   Osteopenia of neck of femur 11/03/2022   History of anemia 11/03/2022    Persistent cough 11/03/2022   Multilevel degenerative disc disease 10/17/2021   Osteoarthritis of knee 10/17/2021   Tear of medial meniscus of knee, current 10/17/2021   Primary localized osteoarthritis of pelvic region and thigh 10/17/2021    PCP: Remo Lipps, PA  REFERRING PROVIDER: Sheral Apley, MD  REFERRING DIAG: [id number] (ICD-10-CM) - S/P TKR (total knee replacement), righ   RATIONALE FOR EVALUATION AND TREATMENT: Rehabilitation  THERAPY DIAG: Acute pain of right knee  Stiffness of right knee, not elsewhere classified  Difficulty in walking, not elsewhere classified  Muscle weakness (generalized)  ONSET DATE: R TKA, DOS 04/05/23  FOLLOW-UP APPT SCHEDULED WITH REFERRING PROVIDER: Yes ; 04/19/23   SUBJECTIVE STATEMENT:   Pt had a rough night last night, still struggling with medication side effects, did not sleep until 4am..CPM to be picked up soon.   PERTINENT HISTORY: Pt is a 67 year old female s/p R TKA 04/05/23. Patient reports no significant post-op complications. She and her husband report being off of pain medicine regimen last evening and this AM - more pain this AM due to this. Pt is using ice machine at home often. Patient is using FWW for mobility at the time. Pt reports difficulty with moving LE to/from bed. Patient reports she is doing better with getting onto commode at this time.   PAIN:  Yes 3/10   PRECAUTIONS: None  WEIGHT BEARING RESTRICTIONS: Yes WBAT  FALLS: Has patient fallen in last 6 months? No   OBJECTIVE:    TODAY'S TREATMENT: 04/25/23  -Nustep, seat 10x2 minutes, then seat 9 x3 minutes, level 0 -AMB 652ft AMB in hallway with RW, close to symmetrical motor planning c RW [place], no pain changes, no motor changes -supine heel slides c belt self overpressure 10x10sec (final flexion @ 116 degrees)  -supine quad set 10x3secH  -supine SLR x10  -supine quad set 10x3secH  -supine SLR x10   -hook lying bridge 1x10  -hook lying bridge  1x10  -STS from elevation x5, hands free, good symmetry  -BUE supported heel raises x15  -BUE supported high knee marching x20 alterating sides    PATIENT EDUCATION:  Education details: see above for patient education details Person educated: Patient Education method: Explanation, Demonstration, and Handouts Education comprehension: verbalized understanding and returned demonstration   HOME EXERCISE PROGRAM:  Access Code: [email or web address] URL: [email or web address] Date: 04/18/2023 Prepared by: Consuela Mimes  Exercises - Supine Quad Set  - 3 x daily - 7 x weekly - 2 sets - 10 reps - 5sec hold - Supine Heel Slide with Strap  - 2 x daily - 7 x weekly - 2 sets - 10 reps - 5sec hold - Seated Long Arc Quad  - 2 x daily - 7 x weekly - 2 sets - 10 reps - Supine Ankle Pumps  - 3 x daily - 7 x weekly - 2 sets - 10 reps   ASSESSMENT:  CLINICAL IMPRESSION:  Despite struggles overnight with sleeplessness, pain, nausea, pt showing impressive progression in ROM, motor control, and activity tolerance this date. ROM in the <10 to 116 degrees range. A variety of open and closed chain activity in session. Pt will continue to benefit from skilled PT services to address deficits and improve function.   OBJECTIVE IMPAIRMENTS: Abnormal gait, decreased balance, difficulty walking, decreased ROM, decreased strength, hypomobility, increased edema, impaired flexibility, and pain.   ACTIVITY LIMITATIONS: carrying, lifting, bending, sitting, standing, squatting, stairs, transfers, bed mobility, toileting, dressing, and locomotion level  PARTICIPATION LIMITATIONS: meal prep, cleaning, laundry, driving, shopping, community activity, and crafting/gardening  PERSONAL FACTORS: Age and involved orthopedic history are also affecting patient's functional outcome.   REHAB POTENTIAL: Good  CLINICAL DECISION MAKING: Evolving/moderate complexity  EVALUATION COMPLEXITY: Moderate   GOALS: Goals  reviewed with patient? Yes  SHORT TERM GOALS: Target date: 05/03/2023  Pt will be independent with HEP to improve strength and decrease knee pain to improve pain-free function at home and work. Baseline: 04/09/23: Baseline HEP initiated Goal status: INITIAL  Pt will improve knee flexion ROM to 0-120 deg as needed for improved ability to perform transferring from surfaces of various height, self-care ADLs e.g. dressing/hygiene Baseline: 04/09/23: AROM -3-81 Goal status: INITIAL  LONG TERM GOALS: Target date: 06/07/2023  Pt will increase FOTO to at least 51 to demonstrate significant improvement in function at home and work related to knee pain  Baseline: 04/09/23: 21 Goal status: INITIAL  2.  Pt will decrease worst knee pain by at least 3 points on the NPRS in order to demonstrate clinically significant reduction in knee pain. Baseline: 04/09/23: 9-10/10 at worst Goal status: INITIAL  3.  Pt will ambulate with no AD for 660 ft or greater without significant gait deviation, LOB, or increase in knee pain > 1-2/10 as needed for community-level gait      Baseline: 04/09/23: Limited household distance tolerated, using FWW at this time.  Goal status: INITIAL  4.  Pt will increase strength of tested LE musculature to 4+/5 MMT grade or greater in order to demonstrate improvement in strength and function  Baseline: 04/09/23: Hip flexor and knee strength 2-4/5, good seated hip ABD/ADD.  Goal status: INITIAL   PLAN: PT FREQUENCY: 1-2x/week  PT DURATION: 8 weeks  PLANNED INTERVENTIONS: Therapeutic exercises, Therapeutic activity, Neuromuscular re-education, Balance training, Gait training, Patient/Family education, Self Care, Joint mobilization, Orthotic/Fit training, DME instructions, Electrical Stimulation, Cryotherapy, Manual therapy, and Re-evaluation.  PLAN FOR NEXT SESSION: Progress with knee ROM as tolerated, quadriceps isometrics and lying open-chain isotonics, progressive  weightbearing/stepping activity as tolerated. Swelling/edema/pain control in early post-op phase prn.   10:48 AM, 04/25/23 Rosamaria Lints, PT, DPT Physical Therapist - [facility] in [place]  [phone] (Office)    [place] C, PT 04/25/2023, 10:48 AM

## 2023-04-30 ENCOUNTER — Ambulatory Visit: Payer: Medicare Other | Admitting: Physical Therapy

## 2023-04-30 ENCOUNTER — Encounter: Payer: Self-pay | Admitting: Physical Therapy

## 2023-04-30 DIAGNOSIS — M25661 Stiffness of right knee, not elsewhere classified: Secondary | ICD-10-CM

## 2023-04-30 DIAGNOSIS — M25561 Pain in right knee: Secondary | ICD-10-CM | POA: Diagnosis not present

## 2023-04-30 DIAGNOSIS — M6281 Muscle weakness (generalized): Secondary | ICD-10-CM

## 2023-04-30 DIAGNOSIS — R262 Difficulty in walking, not elsewhere classified: Secondary | ICD-10-CM

## 2023-04-30 NOTE — Therapy (Signed)
OUTPATIENT PHYSICAL THERAPY KNEE POST-OP TREATMENT  Patient Name: Colleen Price MRN: [id number] DOB:19-Apr-1956, 67 y.o., female Today's Date: 04/30/2023  END OF SESSION:  PT End of Session - 04/30/23 1036     Visit Number 7    Number of Visits 17    Date for PT Re-Evaluation 06/04/23    Authorization Type Medicare A&B; [employer] supplemental    Progress Note Due on Visit 10    PT Start Time 1031    PT Stop Time 1114    PT Time Calculation (min) 43 min    Activity Tolerance Patient tolerated treatment well;No increased pain    Behavior During Therapy WFL for tasks assessed/performed              Past Medical History:  Diagnosis Date   Arthritis    KNEES   Cancer (HCC)    Complication of anesthesia    PONV (postoperative nausea and vomiting)    PT STATES SCOPALAMINE PATCH HELPED WITH HER LAST SURGERY   Past Surgical History:  Procedure Laterality Date   ABDOMINAL HYSTERECTOMY  1990   BREAST LUMPECTOMY WITH SENTINEL LYMPH NODE BIOPSY Right 10/14/2015   Procedure: BREAST LUMPECTOMY WITH SENTINEL LYMPH NODE BX;  Surgeon: Kieth Brightly, MD;  Location: [facility];  Service: General;  Laterality: Right;   CATARACT EXTRACTION W/PHACO Right 01/23/2018   Procedure: CATARACT EXTRACTION PHACO AND INTRAOCULAR LENS PLACEMENT (IOC)  RIGHT;  Surgeon: Lockie Mola, MD;  Location: [facility];  Service: Ophthalmology;  Laterality: Right;   CATARACT EXTRACTION W/PHACO Left 02/13/2018   Procedure: CATARACT EXTRACTION PHACO AND INTRAOCULAR LENS PLACEMENT (IOC) LEFT;  Surgeon: Lockie Mola, MD;  Location: [facility];  Service: Ophthalmology;  Laterality: Left;   COLONOSCOPY     KNEE ARTHROSCOPY     Patient Active Problem List   Diagnosis Date Noted   Vaccine refused by patient 12/01/2022   Generalized osteoarthritis of multiple sites 11/03/2022   History of breast cancer in female 11/03/2022   Osteopenia of neck of femur 11/03/2022   History of anemia  11/03/2022   Persistent cough 11/03/2022   Multilevel degenerative disc disease 10/17/2021   Osteoarthritis of knee 10/17/2021   Tear of medial meniscus of knee, current 10/17/2021   Primary localized osteoarthritis of pelvic region and thigh 10/17/2021    PCP: Remo Lipps, PA  REFERRING PROVIDER: Sheral Apley, MD  REFERRING DIAG: [phone] (ICD-10-CM) - S/P TKR (total knee replacement), righ   RATIONALE FOR EVALUATION AND TREATMENT: Rehabilitation  THERAPY DIAG: Acute pain of right knee  Stiffness of right knee, not elsewhere classified  Difficulty in walking, not elsewhere classified  Muscle weakness (generalized)  ONSET DATE: R TKA, DOS 04/05/23  FOLLOW-UP APPT SCHEDULED WITH REFERRING PROVIDER: Yes ; 04/19/23  PERTINENT HISTORY: Pt is a 67 year old female s/p R TKA 04/05/23. Patient reports no significant post-op complications. She and her husband report being off of pain medicine regimen last evening and this AM - more pain this AM due to this. Pt is using ice machine at home often. Patient is using FWW for mobility at the time. Pt reports difficulty with moving LE to/from bed. Patient reports she is doing better with getting onto commode at this time.   PAIN:   Pain Intensity: Present: 7/10, Worst: 9-10/10 Pain location: R knee Pain quality: sharp and burning   Swelling: Yes ; expected post-op swelling Numbness/Tingling: Yes; some anterior knee/peri-incisional sensory change Focal weakness or buckling: Yes; weakness in antigravity mm for  RLE Aggravating factors: attempting to lift surgical LE on/off bed, car transfers Relieving factors: pain medicine, Tylenol, ice History of prior back, hip, or knee injury, pain, surgery, or therapy: Yes; Hx of meniscus tear with arthroscopy/partial meniscectomy   Imaging: No  Prior level of function: Independent with community mobility without device Occupational demands: Retired Presenter, broadcasting: Clinical cytogeneticist, gardening/flower bed,  active lifestyle   Red flags: Negative for personal history of cancer, chills/fever, night sweats, nausea, vomiting, unexplained weight gain/loss, unrelenting pain  PRECAUTIONS: None  WEIGHT BEARING RESTRICTIONS: Yes WBAT  FALLS: Has patient fallen in last 6 months? No  Living Environment Lives with: lives with their spouse; her sister lives locally and is able to help Lives in: House/apartment 2 steps to get into home - step down into family room (2 steps) - no handrail for home entrance; home is one level. [place] to walk into home; concrete sidewalk to get to/from car.   Patient Goals: Able to get back to normal community-level mobility, dec R knee pain   OBJECTIVE:    Gross Musculoskeletal Assessment Tremor: None Bulk: R quadriceps atrophy Tone: Normal  GAIT: Distance walked: 30 ft Assistive device utilized: Walker - 2 wheeled Level of assistance: CGA Comments: Decreased R knee terminal extension at terminal swing   AROM AROM (Normal range in degrees) AROM 04/09/23  Hip Right Left  Flexion (125)    Extension (15)    Abduction (40)    Adduction     Internal Rotation (45)    External Rotation (45)        Knee    Flexion (135) 81 130  Extension (0) -3 +3      Ankle    Dorsiflexion (20) WNL WNL  Plantarflexion (50)    Inversion (35)    Eversion (15    (* = pain; Blank rows = not tested)  LE MMT: MMT (out of 5) Right Left  Hip flexion 2 4  Hip extension    Hip abduction (seated) 5 5  Hip adduction (seated) 5 5  Hip internal rotation    Hip external rotation    Knee flexion 4 5  Knee extension 2 4  Ankle dorsiflexion    Ankle plantarflexion    Ankle inversion    Ankle eversion    (* = pain; Blank rows = not tested)  Sensation Only altered sensation peri-incisional region  Reflexes Deferred  Muscle Length Deferred  Palpation Location LEFT  RIGHT           Quadriceps    Medial Hamstrings    Lateral Hamstrings    Lateral Hamstring tendon     Medial Hamstring tendon    Quadriceps tendon  0  Patella  1  Patellar Tendon  1  Tibial Tuberosity  1  Medial joint line  2  Lateral joint line  2  MCL    LCL    Adductor Tubercle    Pes Anserine tendon    Infrapatellar fat pad    Fibular head    Popliteal fossa    (Blank rows = not tested) Graded on 0-4 scale (0 = no pain, 1 = pain, 2 = pain with wincing/grimacing/flinching, 3 = pain with withdrawal, 4 = unwilling to allow palpation), (Blank rows = not tested)     TODAY'S TREATMENT:   SUBJECTIVE STATEMENT:   Pt reports having stomach pain/GERD associated with medicine. She has elected to stop taking medicine due to stomach/chest pain. Pt reports 2.5-3/10 pain. Patient reports tolerating last session  well. Pt is compliant with HEP.     Manual Therapy - for symptom modulation, soft tissue sensitivity and mobility, joint mobility, ROM   Patellar mobilization 2 x 30 sec bouts, emphasis on superior and inferior glides R knee PROM as tolerated; x 6 minutes into flexion and extension in supine with gentle overpressure into flexion as tolerated  R knee PROM 0-115 deg   *not today* Tibiofemoral mobilization; 2 x 30 second bouts each; gr II for pain control and gr III for mobility STM and gentle IASTM with Theraband roller, R medial calf; x  5 minutes with pt in hooklying   Therapeutic Exercise - for improved soft tissue flexibility and extensibility as needed for ROM, improved strength as needed to improve performance of CKC  activities/functional movements   NuStep; Level 3, x 5 minutes - for improved soft tissue mobility and increased tissue temperature to improve muscle performance   -subjective gathered during this time  Heel slide with bedsheet; 1x10, 5 sec hold  Short arc quad with green bolster; 2x10, 3 sec hold   -with 4-lb AW  SLR; 2x6  Long arc quad, edge of treatment table; 2x10, 3 sec hold   -with 4-lb AW  On blue agility ladder: Forward and retro step  with SPC LUE support on bars; x 5 D/B length of parallel bars  On staircase, center of gym: Toe tap; 6-inch step; 2x10 alternating R/L   PATIENT EDUCATION: Discussed expected progression in PT and improvement in pain/stiffness with successive weeks, and continued work on HEP.      Cold pack (unbilled) - for anti-inflammatory and analgesic effect as needed for reduced pain and improved ability to participate in active PT intervention, along R knee in supine with pillow under calf, x 5 minutes   *not today* Dynamic march, forward stepping only; 5x D/B Standing march, in walker; 2x10 alternating R/L Supine quad set; 1 x 10, 5 sec hold  SLR; 2 x 4  Ball squeeze hip adductor isometric; 2 x 10 5 sec Supine hip abduction, Blue Tband; 2x10      PATIENT EDUCATION:  Education details: see above for patient education details Person educated: Patient Education method: Explanation, Demonstration, and Handouts Education comprehension: verbalized understanding and returned demonstration   HOME EXERCISE PROGRAM:  Access Code: [email or web address] URL: [email or web address] Date: 04/18/2023 Prepared by: Consuela Mimes  Exercises - Supine Quad Set  - 3 x daily - 7 x weekly - 2 sets - 10 reps - 5sec hold - Supine Heel Slide with Strap  - 2 x daily - 7 x weekly - 2 sets - 10 reps - 5sec hold - Seated Long Arc Quad  - 2 x daily - 7 x weekly - 2 sets - 10 reps - Supine Ankle Pumps  - 3 x daily - 7 x weekly - 2 sets - 10 reps   ASSESSMENT:  CLINICAL IMPRESSION: Patient fortunately is progressing very well with knee ROM, flexion up to 115 deg passively today. She has struggled with GI side effects from most recent Rx for pain control. She stopped taking medicine, and is using no medication for pain control today - she fortunately has relatively low-to-moderate pain at arrival. She does still have notable discomfort with end-range knee flexion ROM. Pt is progressing well with use of SPC and  even no AD for short-distance gait. Pt has expected post-op impairments in R knee ROM, decreased quadriceps activation and decreased LE strength, R knee complex stiffness, post-op pain, post-op edema, and  dec RLE weightbearing tolerance. Pt will continue to benefit from skilled PT services to address deficits and improve function.   OBJECTIVE IMPAIRMENTS: Abnormal gait, decreased balance, difficulty walking, decreased ROM, decreased strength, hypomobility, increased edema, impaired flexibility, and pain.   ACTIVITY LIMITATIONS: carrying, lifting, bending, sitting, standing, squatting, stairs, transfers, bed mobility, toileting, dressing, and locomotion level  PARTICIPATION LIMITATIONS: meal prep, cleaning, laundry, driving, shopping, community activity, and crafting/gardening  PERSONAL FACTORS: Age and involved orthopedic history are also affecting patient's functional outcome.   REHAB POTENTIAL: Good  CLINICAL DECISION MAKING: Evolving/moderate complexity  EVALUATION COMPLEXITY: Moderate   GOALS: Goals reviewed with patient? Yes  SHORT TERM GOALS: Target date: 05/03/2023  Pt will be independent with HEP to improve strength and decrease knee pain to improve pain-free function at home and work. Baseline: 04/09/23: Baseline HEP initiated Goal status: INITIAL  Pt will improve knee flexion ROM to 0-120 deg as needed for improved ability to perform transferring from surfaces of various height, self-care ADLs e.g. dressing/hygiene Baseline: 04/09/23: AROM -3-81 Goal status: INITIAL  LONG TERM GOALS: Target date: 06/07/2023  Pt will increase FOTO to at least 51 to demonstrate significant improvement in function at home and work related to knee pain  Baseline: 04/09/23: 21 Goal status: INITIAL  2.  Pt will decrease worst knee pain by at least 3 points on the NPRS in order to demonstrate clinically significant reduction in knee pain. Baseline: 04/09/23: 9-10/10 at worst Goal status:  INITIAL  3.  Pt will ambulate with no AD for 660 ft or greater without significant gait deviation, LOB, or increase in knee pain > 1-2/10 as needed for community-level gait      Baseline: 04/09/23: Limited household distance tolerated, using FWW at this time.  Goal status: INITIAL  4.  Pt will increase strength of tested LE musculature to 4+/5 MMT grade or greater in order to demonstrate improvement in strength and function  Baseline: 04/09/23: Hip flexor and knee strength 2-4/5, good seated hip ABD/ADD.  Goal status: INITIAL   PLAN: PT FREQUENCY: 1-2x/week  PT DURATION: 8 weeks  PLANNED INTERVENTIONS: Therapeutic exercises, Therapeutic activity, Neuromuscular re-education, Balance training, Gait training, Patient/Family education, Self Care, Joint mobilization, Orthotic/Fit training, DME instructions, Electrical Stimulation, Cryotherapy, Manual therapy, and Re-evaluation.  PLAN FOR NEXT SESSION: Progress with knee ROM as tolerated, quadriceps isometrics and lying open-chain isotonics, progressive weightbearing/stepping activity as tolerated. Swelling/edema/pain control in early post-op phase prn.    Consuela Mimes, PT, DPT #[id number]  Gertie Exon, PT 04/30/2023, 11:03 AM

## 2023-04-30 NOTE — Therapy (Deleted)
OUTPATIENT PHYSICAL THERAPY TREATMENT  Patient Name: Colleen Price MRN: [id number] DOB:12/22/1955, 67 y.o., female Today's Date: 04/30/2023  END OF SESSION:      Past Medical History:  Diagnosis Date   Arthritis    KNEES   Cancer (HCC)    Complication of anesthesia    PONV (postoperative nausea and vomiting)    PT STATES SCOPALAMINE PATCH HELPED WITH HER LAST SURGERY   Past Surgical History:  Procedure Laterality Date   ABDOMINAL HYSTERECTOMY  1990   BREAST LUMPECTOMY WITH SENTINEL LYMPH NODE BIOPSY Right 10/14/2015   Procedure: BREAST LUMPECTOMY WITH SENTINEL LYMPH NODE BX;  Surgeon: Kieth Brightly, MD;  Location: [facility];  Service: General;  Laterality: Right;   CATARACT EXTRACTION W/PHACO Right 01/23/2018   Procedure: CATARACT EXTRACTION PHACO AND INTRAOCULAR LENS PLACEMENT (IOC)  RIGHT;  Surgeon: Lockie Mola, MD;  Location: [facility];  Service: Ophthalmology;  Laterality: Right;   CATARACT EXTRACTION W/PHACO Left 02/13/2018   Procedure: CATARACT EXTRACTION PHACO AND INTRAOCULAR LENS PLACEMENT (IOC) LEFT;  Surgeon: Lockie Mola, MD;  Location: [facility];  Service: Ophthalmology;  Laterality: Left;   COLONOSCOPY     KNEE ARTHROSCOPY     Patient Active Problem List   Diagnosis Date Noted   Vaccine refused by patient 12/01/2022   Generalized osteoarthritis of multiple sites 11/03/2022   History of breast cancer in female 11/03/2022   Osteopenia of neck of femur 11/03/2022   History of anemia 11/03/2022   Persistent cough 11/03/2022   Multilevel degenerative disc disease 10/17/2021   Osteoarthritis of knee 10/17/2021   Tear of medial meniscus of knee, current 10/17/2021   Primary localized osteoarthritis of pelvic region and thigh 10/17/2021    PCP: Remo Lipps, PA  REFERRING PROVIDER: Sheral Apley, MD  REFERRING DIAG: [phone] (ICD-10-CM) - S/P TKR (total knee replacement), righ   RATIONALE FOR EVALUATION AND  TREATMENT: Rehabilitation  THERAPY DIAG: Acute pain of right knee  Stiffness of right knee, not elsewhere classified  Difficulty in walking, not elsewhere classified  Muscle weakness (generalized)  ONSET DATE: R TKA, DOS 04/05/23  FOLLOW-UP APPT SCHEDULED WITH REFERRING PROVIDER: Yes ; 04/19/23   SUBJECTIVE STATEMENT:   Pt had a rough night last night, still struggling with medication side effects, did not sleep until 4am..CPM to be picked up soon.   PERTINENT HISTORY: Pt is a 67 year old female s/p R TKA 04/05/23. Patient reports no significant post-op complications. She and her husband report being off of pain medicine regimen last evening and this AM - more pain this AM due to this. Pt is using ice machine at home often. Patient is using FWW for mobility at the time. Pt reports difficulty with moving LE to/from bed. Patient reports she is doing better with getting onto commode at this time.   PAIN:  Yes 3/10   PRECAUTIONS: None  WEIGHT BEARING RESTRICTIONS: Yes WBAT  FALLS: Has patient fallen in last 6 months? No   OBJECTIVE:    TODAY'S TREATMENT: 04/30/23  -Nustep, seat 10x2 minutes, then seat 9 x3 minutes, level 0 -AMB 673ft AMB in hallway with RW, close to symmetrical motor planning c RW [place], no pain changes, no motor changes -supine heel slides c belt self overpressure 10x10sec (final flexion @ 116 degrees)  -supine quad set 10x3secH  -supine SLR x10  -supine quad set 10x3secH  -supine SLR x10   -hook lying bridge 1x10  -hook lying bridge 1x10  -STS from elevation x5, hands free,  good symmetry  -BUE supported heel raises x15  -BUE supported high knee marching x20 alterating sides    PATIENT EDUCATION:  Education details: see above for patient education details Person educated: Patient Education method: Explanation, Demonstration, and Handouts Education comprehension: verbalized understanding and returned demonstration   HOME EXERCISE PROGRAM:  Access  Code: [email or web address] URL: [email or web address] Date: 04/18/2023 Prepared by: Consuela Mimes  Exercises - Supine Quad Set  - 3 x daily - 7 x weekly - 2 sets - 10 reps - 5sec hold - Supine Heel Slide with Strap  - 2 x daily - 7 x weekly - 2 sets - 10 reps - 5sec hold - Seated Long Arc Quad  - 2 x daily - 7 x weekly - 2 sets - 10 reps - Supine Ankle Pumps  - 3 x daily - 7 x weekly - 2 sets - 10 reps   ASSESSMENT:  CLINICAL IMPRESSION:  Despite struggles overnight with sleeplessness, pain, nausea, pt showing impressive progression in ROM, motor control, and activity tolerance this date. ROM in the <10 to 116 degrees range. A variety of open and closed chain activity in session. Pt will continue to benefit from skilled PT services to address deficits and improve function.   OBJECTIVE IMPAIRMENTS: Abnormal gait, decreased balance, difficulty walking, decreased ROM, decreased strength, hypomobility, increased edema, impaired flexibility, and pain.   ACTIVITY LIMITATIONS: carrying, lifting, bending, sitting, standing, squatting, stairs, transfers, bed mobility, toileting, dressing, and locomotion level  PARTICIPATION LIMITATIONS: meal prep, cleaning, laundry, driving, shopping, community activity, and crafting/gardening  PERSONAL FACTORS: Age and involved orthopedic history are also affecting patient's functional outcome.   REHAB POTENTIAL: Good  CLINICAL DECISION MAKING: Evolving/moderate complexity  EVALUATION COMPLEXITY: Moderate   GOALS: Goals reviewed with patient? Yes  SHORT TERM GOALS: Target date: 05/03/2023  Pt will be independent with HEP to improve strength and decrease knee pain to improve pain-free function at home and work. Baseline: 04/09/23: Baseline HEP initiated Goal status: INITIAL  Pt will improve knee flexion ROM to 0-120 deg as needed for improved ability to perform transferring from surfaces of various height, self-care ADLs e.g.  dressing/hygiene Baseline: 04/09/23: AROM -3-81 Goal status: INITIAL  LONG TERM GOALS: Target date: 06/07/2023  Pt will increase FOTO to at least 51 to demonstrate significant improvement in function at home and work related to knee pain  Baseline: 04/09/23: 21 Goal status: INITIAL  2.  Pt will decrease worst knee pain by at least 3 points on the NPRS in order to demonstrate clinically significant reduction in knee pain. Baseline: 04/09/23: 9-10/10 at worst Goal status: INITIAL  3.  Pt will ambulate with no AD for 660 ft or greater without significant gait deviation, LOB, or increase in knee pain > 1-2/10 as needed for community-level gait      Baseline: 04/09/23: Limited household distance tolerated, using FWW at this time.  Goal status: INITIAL  4.  Pt will increase strength of tested LE musculature to 4+/5 MMT grade or greater in order to demonstrate improvement in strength and function  Baseline: 04/09/23: Hip flexor and knee strength 2-4/5, good seated hip ABD/ADD.  Goal status: INITIAL   PLAN: PT FREQUENCY: 1-2x/week  PT DURATION: 8 weeks  PLANNED INTERVENTIONS: Therapeutic exercises, Therapeutic activity, Neuromuscular re-education, Balance training, Gait training, Patient/Family education, Self Care, Joint mobilization, Orthotic/Fit training, DME instructions, Electrical Stimulation, Cryotherapy, Manual therapy, and Re-evaluation.  PLAN FOR NEXT SESSION: Progress with knee ROM as tolerated, quadriceps isometrics and lying open-chain isotonics, progressive weightbearing/stepping  activity as tolerated. Swelling/edema/pain control in early post-op phase prn.   8:10 AM, 04/30/23 Rosamaria Lints, PT, DPT Physical Therapist - [facility] in [place]  [phone] (Office)    Gertie Exon, PT 04/30/2023, 8:10 AM

## 2023-05-02 ENCOUNTER — Ambulatory Visit: Payer: Medicare Other | Admitting: Physical Therapy

## 2023-05-02 ENCOUNTER — Encounter: Payer: Self-pay | Admitting: Physical Therapy

## 2023-05-02 DIAGNOSIS — R262 Difficulty in walking, not elsewhere classified: Secondary | ICD-10-CM

## 2023-05-02 DIAGNOSIS — M25561 Pain in right knee: Secondary | ICD-10-CM | POA: Diagnosis not present

## 2023-05-02 DIAGNOSIS — M25661 Stiffness of right knee, not elsewhere classified: Secondary | ICD-10-CM

## 2023-05-02 DIAGNOSIS — M6281 Muscle weakness (generalized): Secondary | ICD-10-CM

## 2023-05-02 NOTE — Therapy (Signed)
OUTPATIENT PHYSICAL THERAPY KNEE POST-OP TREATMENT  Patient Name: Colleen Price MRN: [id number] DOB:[birth year], 67 y.o., female Today's Date: 05/02/2023  END OF SESSION:  PT End of Session - 05/02/23 1035     Visit Number 8    Number of Visits 17    Date for PT Re-Evaluation 06/04/23    Authorization Type Medicare A&B; [employer] supplemental    Progress Note Due on Visit 10    PT Start Time 1031    PT Stop Time 1114    PT Time Calculation (min) 43 min    Activity Tolerance Patient tolerated treatment well;No increased pain    Behavior During Therapy WFL for tasks assessed/performed               Past Medical History:  Diagnosis Date   Arthritis    KNEES   Cancer (HCC)    Complication of anesthesia    PONV (postoperative nausea and vomiting)    PT STATES SCOPALAMINE PATCH HELPED WITH HER LAST SURGERY   Past Surgical History:  Procedure Laterality Date   ABDOMINAL HYSTERECTOMY  1990   BREAST LUMPECTOMY WITH SENTINEL LYMPH NODE BIOPSY Right 10/14/2015   Procedure: BREAST LUMPECTOMY WITH SENTINEL LYMPH NODE BX;  Surgeon: Kieth Brightly, MD;  Location: [facility];  Service: General;  Laterality: Right;   CATARACT EXTRACTION W/PHACO Right 01/23/2018   Procedure: CATARACT EXTRACTION PHACO AND INTRAOCULAR LENS PLACEMENT (IOC)  RIGHT;  Surgeon: Lockie Mola, MD;  Location: [facility];  Service: Ophthalmology;  Laterality: Right;   CATARACT EXTRACTION W/PHACO Left 02/13/2018   Procedure: CATARACT EXTRACTION PHACO AND INTRAOCULAR LENS PLACEMENT (IOC) LEFT;  Surgeon: Lockie Mola, MD;  Location: [facility];  Service: Ophthalmology;  Laterality: Left;   COLONOSCOPY     KNEE ARTHROSCOPY     Patient Active Problem List   Diagnosis Date Noted   Vaccine refused by patient 12/01/2022   Generalized osteoarthritis of multiple sites 11/03/2022   History of breast cancer in female 11/03/2022   Osteopenia of neck of femur 11/03/2022   History of anemia  11/03/2022   Persistent cough 11/03/2022   Multilevel degenerative disc disease 10/17/2021   Osteoarthritis of knee 10/17/2021   Tear of medial meniscus of knee, current 10/17/2021   Primary localized osteoarthritis of pelvic region and thigh 10/17/2021    PCP: Remo Lipps, PA  REFERRING PROVIDER: Sheral Apley, MD  REFERRING DIAG: [phone] (ICD-10-CM) - S/P TKR (total knee replacement), righ   RATIONALE FOR EVALUATION AND TREATMENT: Rehabilitation  THERAPY DIAG: Acute pain of right knee  Stiffness of right knee, not elsewhere classified  Difficulty in walking, not elsewhere classified  Muscle weakness (generalized)  ONSET DATE: R TKA, DOS 04/05/23  FOLLOW-UP APPT SCHEDULED WITH REFERRING PROVIDER: Yes ; 04/19/23  PERTINENT HISTORY: Pt is a 67 year old female s/p R TKA 04/05/23. Patient reports no significant post-op complications. She and her husband report being off of pain medicine regimen last evening and this AM - more pain this AM due to this. Pt is using ice machine at home often. Patient is using FWW for mobility at the time. Pt reports difficulty with moving LE to/from bed. Patient reports she is doing better with getting onto commode at this time.   PAIN:   Pain Intensity: Present: 7/10, Worst: 9-10/10 Pain location: R knee Pain quality: sharp and burning   Swelling: Yes ; expected post-op swelling Numbness/Tingling: Yes; some anterior knee/peri-incisional sensory change Focal weakness or buckling: Yes; weakness in antigravity mm  for RLE Aggravating factors: attempting to lift surgical LE on/off bed, car transfers Relieving factors: pain medicine, Tylenol, ice History of prior back, hip, or knee injury, pain, surgery, or therapy: Yes; Hx of meniscus tear with arthroscopy/partial meniscectomy   Imaging: No  Prior level of function: Independent with community mobility without device Occupational demands: Retired Presenter, broadcasting: Clinical cytogeneticist, gardening/flower bed,  active lifestyle   Red flags: Negative for personal history of cancer, chills/fever, night sweats, nausea, vomiting, unexplained weight gain/loss, unrelenting pain  PRECAUTIONS: None  WEIGHT BEARING RESTRICTIONS: Yes WBAT  FALLS: Has patient fallen in last 6 months? No  Living Environment Lives with: lives with their spouse; her sister lives locally and is able to help Lives in: House/apartment 2 steps to get into home - step down into family room (2 steps) - no handrail for home entrance; home is one level. [place] to walk into home; concrete sidewalk to get to/from car.   Patient Goals: Able to get back to normal community-level mobility, dec R knee pain   OBJECTIVE:    Gross Musculoskeletal Assessment Tremor: None Bulk: R quadriceps atrophy Tone: Normal  GAIT: Distance walked: 30 ft Assistive device utilized: Walker - 2 wheeled Level of assistance: CGA Comments: Decreased R knee terminal extension at terminal swing   AROM AROM (Normal range in degrees) AROM 04/09/23  Hip Right Left  Flexion (125)    Extension (15)    Abduction (40)    Adduction     Internal Rotation (45)    External Rotation (45)        Knee    Flexion (135) 81 130  Extension (0) -3 +3      Ankle    Dorsiflexion (20) WNL WNL  Plantarflexion (50)    Inversion (35)    Eversion (15    (* = pain; Blank rows = not tested)  LE MMT: MMT (out of 5) Right Left  Hip flexion 2 4  Hip extension    Hip abduction (seated) 5 5  Hip adduction (seated) 5 5  Hip internal rotation    Hip external rotation    Knee flexion 4 5  Knee extension 2 4  Ankle dorsiflexion    Ankle plantarflexion    Ankle inversion    Ankle eversion    (* = pain; Blank rows = not tested)  Sensation Only altered sensation peri-incisional region  Reflexes Deferred  Muscle Length Deferred  Palpation Location LEFT  RIGHT           Quadriceps    Medial Hamstrings    Lateral Hamstrings    Lateral Hamstring tendon     Medial Hamstring tendon    Quadriceps tendon  0  Patella  1  Patellar Tendon  1  Tibial Tuberosity  1  Medial joint line  2  Lateral joint line  2  MCL    LCL    Adductor Tubercle    Pes Anserine tendon    Infrapatellar fat pad    Fibular head    Popliteal fossa    (Blank rows = not tested) Graded on 0-4 scale (0 = no pain, 1 = pain, 2 = pain with wincing/grimacing/flinching, 3 = pain with withdrawal, 4 = unwilling to allow palpation), (Blank rows = not tested)     TODAY'S TREATMENT:   SUBJECTIVE STATEMENT:   Pt reports taking Advil 2 hours prior to PT this AM. Patient reports getting good night's sleep with using Tylenol PM; pt reports difficulty with sleeping prior  to last night without PM medication. Patient reports some soreness after getting home last visit - she reports doing okay after using ice at home. She reports feeling generally more sore this AM.     Manual Therapy - for symptom modulation, soft tissue sensitivity and mobility, joint mobility, ROM   Patellar mobilization 2 x 30 sec bouts, emphasis on superior and inferior glides R knee PROM as tolerated; x 6 minutes into flexion and extension in supine with gentle overpressure into flexion as tolerated Tibiofemoral mobilization; 2 x 30 second bouts each; gr II for pain control  R knee PROM 0-115 deg   *not today* STM and gentle IASTM with Theraband roller, R medial calf; x  5 minutes with pt in hooklying   Therapeutic Exercise - for improved soft tissue flexibility and extensibility as needed for ROM, improved strength as needed to improve performance of CKC  activities/functional movements   NuStep; Level 5, x 5 minutes; seat at 8 - for improved soft tissue mobility and increased tissue temperature to improve muscle performance   -subjective gathered during this time Heel slide with bedsheet; 1x10, 5 sec hold Short arc quad with green bolster; 2x8, 3 sec hold   -with 5-lb AW SLR; 2x8 Long arc quad,  edge of treatment table; 2x10, 3 sec hold   -with 5-lb AW  In // bars: Forward and retro step with no AD; x 5 D/B length of parallel bars Standing heel raise; 2x10  Dynamic march; 4x D/B length of bars  Sit to stand from raised table; x10  PATIENT EDUCATION: Discussed expected progression in PT and improvement in pain/stiffness with successive weeks, and continued work on [employer].      Cold pack (unbilled) - for anti-inflammatory and analgesic effect as needed for reduced pain and improved ability to participate in active PT intervention, along R knee in supine with pillow under calf, x 5 minutes   *not today* Dynamic march, forward stepping only; 5x D/B Standing march, in walker; 2x10 alternating R/L Supine quad set; 1 x 10, 5 sec hold  SLR; 2 x 4  Ball squeeze hip adductor isometric; 2 x 10 5 sec Supine hip abduction, Blue Tband; 2x10      PATIENT EDUCATION:  Education details: see above for patient education details Person educated: Patient Education method: Explanation, Demonstration, and Handouts Education comprehension: verbalized understanding and returned demonstration   HOME EXERCISE PROGRAM:  Access Code: [email or web address] URL: [email or web address] Date: 04/18/2023 Prepared by: Consuela Mimes  Exercises - Supine Quad Set  - 3 x daily - 7 x weekly - 2 sets - 10 reps - 5sec hold - Supine Heel Slide with Strap  - 2 x daily - 7 x weekly - 2 sets - 10 reps - 5sec hold - Seated Long Arc Quad  - 2 x daily - 7 x weekly - 2 sets - 10 reps - Supine Ankle Pumps  - 3 x daily - 7 x weekly - 2 sets - 10 reps   ASSESSMENT:  CLINICAL IMPRESSION: Patient has normalizing R knee PROM. She still has some stiffness and discomfort with accessing end-range flexion and extension. She has ongoing quad weakness as demonstrated by challenge with SLR performance. Pt does require verbal and tactile cueing to ensure no extensor lag. Pt is able to negotiate her home with no AD at  this time. She still has mild antalgic pattern, decreased terminal knee extension at terminal swing, and decreased knee flexion during swing phase. Pt has expected post-op  impairments in R knee ROM, decreased quadriceps activation and decreased LE strength, R knee complex stiffness, post-op pain, post-op edema, and dec RLE weightbearing tolerance. Pt will continue to benefit from skilled PT services to address deficits and improve function.   OBJECTIVE IMPAIRMENTS: Abnormal gait, decreased balance, difficulty walking, decreased ROM, decreased strength, hypomobility, increased edema, impaired flexibility, and pain.   ACTIVITY LIMITATIONS: carrying, lifting, bending, sitting, standing, squatting, stairs, transfers, bed mobility, toileting, dressing, and locomotion level  PARTICIPATION LIMITATIONS: meal prep, cleaning, laundry, driving, shopping, community activity, and crafting/gardening  PERSONAL FACTORS: Age and involved orthopedic history are also affecting patient's functional outcome.   REHAB POTENTIAL: Good  CLINICAL DECISION MAKING: Evolving/moderate complexity  EVALUATION COMPLEXITY: Moderate   GOALS: Goals reviewed with patient? Yes  SHORT TERM GOALS: Target date: 05/03/2023  Pt will be independent with HEP to improve strength and decrease knee pain to improve pain-free function at home and work. Baseline: 04/09/23: Baseline HEP initiated Goal status: INITIAL  Pt will improve knee flexion ROM to 0-120 deg as needed for improved ability to perform transferring from surfaces of various height, self-care ADLs e.g. dressing/hygiene Baseline: 04/09/23: AROM -3-81 Goal status: INITIAL  LONG TERM GOALS: Target date: 06/07/2023  Pt will increase FOTO to at least 51 to demonstrate significant improvement in function at home and work related to knee pain  Baseline: 04/09/23: 21 Goal status: INITIAL  2.  Pt will decrease worst knee pain by at least 3 points on the NPRS in order to  demonstrate clinically significant reduction in knee pain. Baseline: 04/09/23: 9-10/10 at worst Goal status: INITIAL  3.  Pt will ambulate with no AD for 660 ft or greater without significant gait deviation, LOB, or increase in knee pain > 1-2/10 as needed for community-level gait      Baseline: 04/09/23: Limited household distance tolerated, using FWW at this time.  Goal status: INITIAL  4.  Pt will increase strength of tested LE musculature to 4+/5 MMT grade or greater in order to demonstrate improvement in strength and function  Baseline: 04/09/23: Hip flexor and knee strength 2-4/5, good seated hip ABD/ADD.  Goal status: INITIAL   PLAN: PT FREQUENCY: 1-2x/week  PT DURATION: 8 weeks  PLANNED INTERVENTIONS: Therapeutic exercises, Therapeutic activity, Neuromuscular re-education, Balance training, Gait training, Patient/Family education, Self Care, Joint mobilization, Orthotic/Fit training, DME instructions, Electrical Stimulation, Cryotherapy, Manual therapy, and Re-evaluation.  PLAN FOR NEXT SESSION: Progress with knee ROM as tolerated, quadriceps isometrics and lying open-chain isotonics, progressive weightbearing/stepping activity as tolerated. Swelling/edema/pain control in early post-op phase prn.    Consuela Mimes, PT, DPT #[id number]  Gertie Exon, PT 05/02/2023, 11:10 AM

## 2023-05-07 ENCOUNTER — Ambulatory Visit: Payer: Medicare Other | Admitting: Physical Therapy

## 2023-05-07 DIAGNOSIS — M25661 Stiffness of right knee, not elsewhere classified: Secondary | ICD-10-CM

## 2023-05-07 DIAGNOSIS — R262 Difficulty in walking, not elsewhere classified: Secondary | ICD-10-CM

## 2023-05-07 DIAGNOSIS — M6281 Muscle weakness (generalized): Secondary | ICD-10-CM

## 2023-05-07 DIAGNOSIS — M25561 Pain in right knee: Secondary | ICD-10-CM | POA: Diagnosis not present

## 2023-05-07 NOTE — Therapy (Signed)
OUTPATIENT PHYSICAL THERAPY KNEE POST-OP TREATMENT  Patient Name: Colleen Price MRN: [id number] DOB:04-Nov-1955, 67 y.o., female Today's Date: 05/07/2023  END OF SESSION:  PT End of Session - 05/07/23 1046     Visit Number 9    Number of Visits 17    Date for PT Re-Evaluation 06/04/23    Authorization Type Medicare A&B; [employer] supplemental    Progress Note Due on Visit 10    PT Start Time 1031    PT Stop Time 1113    PT Time Calculation (min) 42 min    Activity Tolerance Patient tolerated treatment well;No increased pain    Behavior During Therapy WFL for tasks assessed/performed                Past Medical History:  Diagnosis Date   Arthritis    KNEES   Cancer (HCC)    Complication of anesthesia    PONV (postoperative nausea and vomiting)    PT STATES SCOPALAMINE PATCH HELPED WITH HER LAST SURGERY   Past Surgical History:  Procedure Laterality Date   ABDOMINAL HYSTERECTOMY  1990   BREAST LUMPECTOMY WITH SENTINEL LYMPH NODE BIOPSY Right 10/14/2015   Procedure: BREAST LUMPECTOMY WITH SENTINEL LYMPH NODE BX;  Surgeon: Kieth Brightly, MD;  Location: [facility];  Service: General;  Laterality: Right;   CATARACT EXTRACTION W/PHACO Right 01/23/2018   Procedure: CATARACT EXTRACTION PHACO AND INTRAOCULAR LENS PLACEMENT (IOC)  RIGHT;  Surgeon: Lockie Mola, MD;  Location: [facility];  Service: Ophthalmology;  Laterality: Right;   CATARACT EXTRACTION W/PHACO Left 02/13/2018   Procedure: CATARACT EXTRACTION PHACO AND INTRAOCULAR LENS PLACEMENT (IOC) LEFT;  Surgeon: Lockie Mola, MD;  Location: [facility];  Service: Ophthalmology;  Laterality: Left;   COLONOSCOPY     KNEE ARTHROSCOPY     Patient Active Problem List   Diagnosis Date Noted   Vaccine refused by patient 12/01/2022   Generalized osteoarthritis of multiple sites 11/03/2022   History of breast cancer in female 11/03/2022   Osteopenia of neck of femur 11/03/2022   History of anemia  11/03/2022   Persistent cough 11/03/2022   Multilevel degenerative disc disease 10/17/2021   Osteoarthritis of knee 10/17/2021   Tear of medial meniscus of knee, current 10/17/2021   Primary localized osteoarthritis of pelvic region and thigh 10/17/2021    PCP: Remo Lipps, PA  REFERRING PROVIDER: Sheral Apley, MD  REFERRING DIAG: [phone] (ICD-10-CM) - S/P TKR (total knee replacement), righ   RATIONALE FOR EVALUATION AND TREATMENT: Rehabilitation  THERAPY DIAG: Acute pain of right knee  Stiffness of right knee, not elsewhere classified  Difficulty in walking, not elsewhere classified  Muscle weakness (generalized)  ONSET DATE: R TKA, DOS 04/05/23  FOLLOW-UP APPT SCHEDULED WITH REFERRING PROVIDER: Yes ; 04/19/23  PERTINENT HISTORY: Pt is a 67 year old female s/p R TKA 04/05/23. Patient reports no significant post-op complications. She and her husband report being off of pain medicine regimen last evening and this AM - more pain this AM due to this. Pt is using ice machine at home often. Patient is using FWW for mobility at the time. Pt reports difficulty with moving LE to/from bed. Patient reports she is doing better with getting onto commode at this time.   PAIN:   Pain Intensity: Present: 7/10, Worst: 9-10/10 Pain location: R knee Pain quality: sharp and burning   Swelling: Yes ; expected post-op swelling Numbness/Tingling: Yes; some anterior knee/peri-incisional sensory change Focal weakness or buckling: Yes; weakness in antigravity  mm for RLE Aggravating factors: attempting to lift surgical LE on/off bed, car transfers Relieving factors: pain medicine, Tylenol, ice History of prior back, hip, or knee injury, pain, surgery, or therapy: Yes; Hx of meniscus tear with arthroscopy/partial meniscectomy   Imaging: No  Prior level of function: Independent with community mobility without device Occupational demands: Retired Presenter, broadcasting: Clinical cytogeneticist, gardening/flower bed,  active lifestyle   Red flags: Negative for personal history of cancer, chills/fever, night sweats, nausea, vomiting, unexplained weight gain/loss, unrelenting pain  PRECAUTIONS: None  WEIGHT BEARING RESTRICTIONS: Yes WBAT  FALLS: Has patient fallen in last 6 months? No  Living Environment Lives with: lives with their spouse; her sister lives locally and is able to help Lives in: House/apartment 2 steps to get into home - step down into family room (2 steps) - no handrail for home entrance; home is one level. [place]vel to walk into home; concrete sidewalk to get to/from car.   Patient Goals: Able to get back to normal community-level mobility, dec R knee pain   OBJECTIVE:    Gross Musculoskeletal Assessment Tremor: None Bulk: R quadriceps atrophy Tone: Normal  GAIT: Distance walked: 30 ft Assistive device utilized: Walker - 2 wheeled Level of assistance: CGA Comments: Decreased R knee terminal extension at terminal swing   AROM AROM (Normal range in degrees) AROM 04/09/23  Hip Right Left  Flexion (125)    Extension (15)    Abduction (40)    Adduction     Internal Rotation (45)    External Rotation (45)        Knee    Flexion (135) 81 130  Extension (0) -3 +3      Ankle    Dorsiflexion (20) WNL WNL  Plantarflexion (50)    Inversion (35)    Eversion (15    (* = pain; Blank rows = not tested)  LE MMT: MMT (out of 5) Right Left  Hip flexion 2 4  Hip extension    Hip abduction (seated) 5 5  Hip adduction (seated) 5 5  Hip internal rotation    Hip external rotation    Knee flexion 4 5  Knee extension 2 4  Ankle dorsiflexion    Ankle plantarflexion    Ankle inversion    Ankle eversion    (* = pain; Blank rows = not tested)  Sensation Only altered sensation peri-incisional region  Reflexes Deferred  Muscle Length Deferred  Palpation Location LEFT  RIGHT           Quadriceps    Medial Hamstrings    Lateral Hamstrings    Lateral Hamstring tendon     Medial Hamstring tendon    Quadriceps tendon  0  Patella  1  Patellar Tendon  1  Tibial Tuberosity  1  Medial joint line  2  Lateral joint line  2  MCL    LCL    Adductor Tubercle    Pes Anserine tendon    Infrapatellar fat pad    Fibular head    Popliteal fossa    (Blank rows = not tested) Graded on 0-4 scale (0 = no pain, 1 = pain, 2 = pain with wincing/grimacing/flinching, 3 = pain with withdrawal, 4 = unwilling to allow palpation), (Blank rows = not tested)     TODAY'S TREATMENT:   SUBJECTIVE STATEMENT:   Pt reports 2/10 pain at arrival. She reports notable pain this AM that improved with OTC Advil and exercises. Pt arrives without SPC and states that  her cane is more of a hindrance at this time.     Manual Therapy - for symptom modulation, soft tissue sensitivity and mobility, joint mobility, ROM   Patellar mobilization 2 x 30 sec bouts, emphasis on superior and inferior glides R knee PROM as tolerated; x 6 minutes into flexion and extension in supine with gentle overpressure into flexion as tolerated Tibiofemoral mobilization; 2 x 30 second bouts each; gr II for pain control  R knee AROM 0-117 deg    *not today* STM and gentle IASTM with Theraband roller, R medial calf; x  5 minutes with pt in hooklying   Therapeutic Exercise - for improved soft tissue flexibility and extensibility as needed for ROM, improved strength as needed to improve performance of CKC  activities/functional movements   NuStep; Level 5, x 5 minutes; seat at 8 - for improved soft tissue mobility and increased tissue temperature to improve muscle performance   -subjective gathered during this time  Short arc quad with green bolster; 2x8, 3 sec hold   -with 5-lb AW  SLR; 2x10 Long arc quad, edge of treatment table; 2x10, 3 sec hold   -with 5-lb AW   On blue agility ladder; Dynamic march; 4x D/B length of bars  In // bars: Standing heel raise; 2x10 Forward lunge; 2x10  Sit to stand  from standard chair with 6-lb Goblet hold; 1 x10  1x10 with chair + Airex under patient's bottom   PATIENT EDUCATION: Discussed current progress and good ROM noted at this time.      Cold pack (unbilled) - for anti-inflammatory and analgesic effect as needed for reduced pain and improved ability to participate in active PT intervention, along R knee in supine with pillow under calf, x 5 minutes   *not today* Heel slide with bedsheet; 1x10, 5 sec hold Dynamic march, forward stepping only; 5x D/B Standing march, in walker; 2x10 alternating R/L Supine quad set; 1 x 10, 5 sec hold  SLR; 2 x 4  Ball squeeze hip adductor isometric; 2 x 10 5 sec Supine hip abduction, Blue Tband; 2x10      PATIENT EDUCATION:  Education details: see above for patient education details Person educated: Patient Education method: Explanation, Demonstration, and Handouts Education comprehension: verbalized understanding and returned demonstration   HOME EXERCISE PROGRAM:  Access Code: [email or web address] URL: [email or web address] Date: 04/18/2023 Prepared by: Consuela Mimes  Exercises - Supine Quad Set  - 3 x daily - 7 x weekly - 2 sets - 10 reps - 5sec hold - Supine Heel Slide with Strap  - 2 x daily - 7 x weekly - 2 sets - 10 reps - 5sec hold - Seated Long Arc Quad  - 2 x daily - 7 x weekly - 2 sets - 10 reps - Supine Ankle Pumps  - 3 x daily - 7 x weekly - 2 sets - 10 reps   ASSESSMENT:  CLINICAL IMPRESSION: Pt demonstrates excellent ROM progress at this time. She has improving quad strength and quad control as exhibited by SLR performance with increased volume and no extensor lag. Patient is not using her cane much at this time and feels it is more of a hindrance than a help. Pt has expected post-op impairments in R knee ROM, decreased quadriceps activation and decreased LE strength, R knee complex stiffness, post-op pain, post-op edema, and dec RLE weightbearing tolerance. Pt will continue  to benefit from skilled PT services to address deficits and improve function.   OBJECTIVE IMPAIRMENTS: Abnormal  gait, decreased balance, difficulty walking, decreased ROM, decreased strength, hypomobility, increased edema, impaired flexibility, and pain.   ACTIVITY LIMITATIONS: carrying, lifting, bending, sitting, standing, squatting, stairs, transfers, bed mobility, toileting, dressing, and locomotion level  PARTICIPATION LIMITATIONS: meal prep, cleaning, laundry, driving, shopping, community activity, and crafting/gardening  PERSONAL FACTORS: Age and involved orthopedic history are also affecting patient's functional outcome.   REHAB POTENTIAL: Good  CLINICAL DECISION MAKING: Evolving/moderate complexity  EVALUATION COMPLEXITY: Moderate   GOALS: Goals reviewed with patient? Yes  SHORT TERM GOALS: Target date: 05/03/2023  Pt will be independent with HEP to improve strength and decrease knee pain to improve pain-free function at home and work. Baseline: 04/09/23: Baseline HEP initiated Goal status: INITIAL  Pt will improve knee flexion ROM to 0-120 deg as needed for improved ability to perform transferring from surfaces of various height, self-care ADLs e.g. dressing/hygiene Baseline: 04/09/23: AROM -3-81 Goal status: INITIAL  LONG TERM GOALS: Target date: 06/07/2023  Pt will increase FOTO to at least 51 to demonstrate significant improvement in function at home and work related to knee pain  Baseline: 04/09/23: 21 Goal status: INITIAL  2.  Pt will decrease worst knee pain by at least 3 points on the NPRS in order to demonstrate clinically significant reduction in knee pain. Baseline: 04/09/23: 9-10/10 at worst Goal status: INITIAL  3.  Pt will ambulate with no AD for 660 ft or greater without significant gait deviation, LOB, or increase in knee pain > 1-2/10 as needed for community-level gait      Baseline: 04/09/23: Limited household distance tolerated, using FWW at this  time.  Goal status: INITIAL  4.  Pt will increase strength of tested LE musculature to 4+/5 MMT grade or greater in order to demonstrate improvement in strength and function  Baseline: 04/09/23: Hip flexor and knee strength 2-4/5, good seated hip ABD/ADD.  Goal status: INITIAL   PLAN: PT FREQUENCY: 1-2x/week  PT DURATION: 8 weeks  PLANNED INTERVENTIONS: Therapeutic exercises, Therapeutic activity, Neuromuscular re-education, Balance training, Gait training, Patient/Family education, Self Care, Joint mobilization, Orthotic/Fit training, DME instructions, Electrical Stimulation, Cryotherapy, Manual therapy, and Re-evaluation.  PLAN FOR NEXT SESSION: Progress with knee ROM as tolerated, quadriceps isometrics and lying open-chain isotonics, progressive weightbearing/stepping activity as tolerated. Swelling/edema/pain control in early post-op phase prn.    Consuela Mimes, PT, DPT #[id number]  Gertie Exon, PT 05/07/2023, 10:47 AM

## 2023-05-08 NOTE — Therapy (Unsigned)
OUTPATIENT PHYSICAL THERAPY TREATMENT AND PROGRESS NOTE   Dates of reporting period  04/09/23   to   05/09/23   Patient Name: Colleen Price MRN: [id number] DOB:[birth year], 67 y.o., female Today's Date: 05/09/2023  END OF SESSION:  PT End of Session - 05/09/23 1037     Visit Number 10    Number of Visits 17    Date for PT Re-Evaluation 06/04/23    Authorization Type Medicare A&B; [employer] supplemental    Progress Note Due on Visit 10    PT Start Time 1032    PT Stop Time 1114    PT Time Calculation (min) 42 min    Activity Tolerance Patient tolerated treatment well;No increased pain    Behavior During Therapy WFL for tasks assessed/performed              Past Medical History:  Diagnosis Date   Arthritis    KNEES   Cancer (HCC)    Complication of anesthesia    PONV (postoperative nausea and vomiting)    PT STATES SCOPALAMINE PATCH HELPED WITH HER LAST SURGERY   Past Surgical History:  Procedure Laterality Date   ABDOMINAL HYSTERECTOMY  1990   BREAST LUMPECTOMY WITH SENTINEL LYMPH NODE BIOPSY Right 10/14/2015   Procedure: BREAST LUMPECTOMY WITH SENTINEL LYMPH NODE BX;  Surgeon: Kieth Brightly, MD;  Location: [facility];  Service: General;  Laterality: Right;   CATARACT EXTRACTION W/PHACO Right 01/23/2018   Procedure: CATARACT EXTRACTION PHACO AND INTRAOCULAR LENS PLACEMENT (IOC)  RIGHT;  Surgeon: Lockie Mola, MD;  Location: [facility];  Service: Ophthalmology;  Laterality: Right;   CATARACT EXTRACTION W/PHACO Left 02/13/2018   Procedure: CATARACT EXTRACTION PHACO AND INTRAOCULAR LENS PLACEMENT (IOC) LEFT;  Surgeon: Lockie Mola, MD;  Location: [facility];  Service: Ophthalmology;  Laterality: Left;   COLONOSCOPY     KNEE ARTHROSCOPY     Patient Active Problem List   Diagnosis Date Noted   Vaccine refused by patient 12/01/2022   Generalized osteoarthritis of multiple sites 11/03/2022   History of breast cancer in female 11/03/2022    Osteopenia of neck of femur 11/03/2022   History of anemia 11/03/2022   Persistent cough 11/03/2022   Multilevel degenerative disc disease 10/17/2021   Osteoarthritis of knee 10/17/2021   Tear of medial meniscus of knee, current 10/17/2021   Primary localized osteoarthritis of pelvic region and thigh 10/17/2021    PCP: Remo Lipps, PA  REFERRING PROVIDER: Sheral Apley, MD  REFERRING DIAG: [phone] (ICD-10-CM) - S/P TKR (total knee replacement), righ   RATIONALE FOR EVALUATION AND TREATMENT: Rehabilitation  THERAPY DIAG: Acute pain of right knee  Stiffness of right knee, not elsewhere classified  Difficulty in walking, not elsewhere classified  Muscle weakness (generalized)  ONSET DATE: R TKA, DOS 04/05/23  FOLLOW-UP APPT SCHEDULED WITH REFERRING PROVIDER: Yes ; 04/19/23  PERTINENT HISTORY: Pt is a 67 year old female s/p R TKA 04/05/23. Patient reports no significant post-op complications. She and her husband report being off of pain medicine regimen last evening and this AM - more pain this AM due to this. Pt is using ice machine at home often. Patient is using FWW for mobility at the time. Pt reports difficulty with moving LE to/from bed. Patient reports she is doing better with getting onto commode at this time.   PAIN:   Pain Intensity: Present: 7/10, Worst: 9-10/10 Pain location: R knee Pain quality: sharp and burning   Swelling: Yes ; expected post-op swelling Numbness/Tingling:  Yes; some anterior knee/peri-incisional sensory change Focal weakness or buckling: Yes; weakness in antigravity mm for RLE Aggravating factors: attempting to lift surgical LE on/off bed, car transfers Relieving factors: pain medicine, Tylenol, ice History of prior back, hip, or knee injury, pain, surgery, or therapy: Yes; Hx of meniscus tear with arthroscopy/partial meniscectomy   Imaging: No  Prior level of function: Independent with community mobility without  device Occupational demands: Retired Presenter, broadcasting: Clinical cytogeneticist, gardening/flower bed, active lifestyle   Red flags: Negative for personal history of cancer, chills/fever, night sweats, nausea, vomiting, unexplained weight gain/loss, unrelenting pain  PRECAUTIONS: None  WEIGHT BEARING RESTRICTIONS: Yes WBAT  FALLS: Has patient fallen in last 6 months? No  Living Environment Lives with: lives with their spouse; her sister lives locally and is able to help Lives in: House/apartment 2 steps to get into home - step down into family room (2 steps) - no handrail for home entrance; home is one level. [place] to walk into home; concrete sidewalk to get to/from car.   Patient Goals: Able to get back to normal community-level mobility, dec R knee pain   OBJECTIVE:    Gross Musculoskeletal Assessment Tremor: None Bulk: R quadriceps atrophy Tone: Normal  GAIT: Distance walked: 30 ft Assistive device utilized: Walker - 2 wheeled Level of assistance: CGA Comments: Decreased R knee terminal extension at terminal swing   AROM AROM (Normal range in degrees) AROM 04/09/23 AROM 05/09/23  Hip Right Left Right Left  Flexion (125)      Extension (15)      Abduction (40)      Adduction       Internal Rotation (45)      External Rotation (45)            Knee      Flexion (135) 81 130 115 130  Extension (0) -3 +3 -1 +3        Ankle      Dorsiflexion (20) WNL WNL WNL WNL  Plantarflexion (50)      Inversion (35)      Eversion (15      (* = pain; Blank rows = not tested)   05/09/23: -1-123   LE MMT: MMT (out of 5) Right Left Right 05/09/23 Left 05/09/23  Hip flexion 2 4 4- 4  Hip extension      Hip abduction (seated) 5 5 5 5   Hip adduction (seated) 5 5 5 5   Hip internal rotation      Hip external rotation      Knee flexion 4 5 4+ 5  Knee extension 2 4 4* 4+  Ankle dorsiflexion      Ankle plantarflexion      Ankle inversion      Ankle eversion      (* = pain; Blank rows = not  tested)  Sensation Only altered sensation peri-incisional region  Reflexes Deferred  Muscle Length Deferred  Palpation Location LEFT  RIGHT           Quadriceps    Medial Hamstrings    Lateral Hamstrings    Lateral Hamstring tendon    Medial Hamstring tendon    Quadriceps tendon  0  Patella  1  Patellar Tendon  1  Tibial Tuberosity  1  Medial joint line  2  Lateral joint line  2  MCL    LCL    Adductor Tubercle    Pes Anserine tendon    Infrapatellar fat pad    Fibular head  Popliteal fossa    (Blank rows = not tested) Graded on 0-4 scale (0 = no pain, 1 = pain, 2 = pain with wincing/grimacing/flinching, 3 = pain with withdrawal, 4 = unwilling to allow palpation), (Blank rows = not tested)     TODAY'S TREATMENT:   SUBJECTIVE STATEMENT:   Pt reports 2/10 pain at arrival to PT.  Patient reports doing well with going up/down steps to enter/exit home. Patient reports compliance with HEP 2x/day. Pt reports doing well with car transfers, sit to stand. Patient reports 70% global rating of improvement. Patient reports she needs improved energy level. Pt is only using Tylenol and Advil now for pain management - no opioids or Rx pain medications.     Manual Therapy - for symptom modulation, soft tissue sensitivity and mobility, joint mobility, ROM   Patellar mobilization 2 x 30 sec bouts, emphasis on superior and inferior glides R knee PROM as tolerated; x 6 minutes into flexion and extension in supine with gentle overpressure into flexion as tolerated Tibiofemoral mobilization; 2 x 30 second bouts each; gr II for pain control  R knee AROM 0-117 deg    *not today* STM and gentle IASTM with Theraband roller, R medial calf; x  5 minutes with pt in hooklying   Therapeutic Exercise - for improved soft tissue flexibility and extensibility as needed for ROM, improved strength as needed to improve performance of CKC  activities/functional movements   *GOAL UPDATE  PERFORMED   NuStep; Level 3, x 5 minutes; seat at 8 - for improved soft tissue mobility and increased tissue temperature to improve muscle performance   -subjective gathered during this time  SLR; 2x10 Long arc quad, edge of treatment table; 2x10, 3 sec hold   -with 5-lb AW  On blue agility ladder; Dynamic march; 4x D/B length of bars  Sit to stand from raised table with 6-lb Goblet hold; 2x10   PATIENT EDUCATION: Discussed progress with PT to date, continued PT POC for 3-4 weeks, prognosis.       *not today* Cold pack (unbilled) - for anti-inflammatory and analgesic effect as needed for reduced pain and improved ability to participate in active PT intervention, along R knee in supine with pillow under calf, x 5 minutes In // bars: Standing heel raise; 2x10 Forward lunge; 2x10 Short arc quad with green bolster; 2x8, 3 sec hold   -with 5-lb AW Heel slide with bedsheet; 1x10, 5 sec hold Dynamic march, forward stepping only; 5x D/B Standing march, in walker; 2x10 alternating R/L Supine quad set; 1 x 10, 5 sec hold  SLR; 2 x 4  Ball squeeze hip adductor isometric; 2 x 10 5 sec Supine hip abduction, Blue Tband; 2x10      PATIENT EDUCATION:  Education details: see above for patient education details Person educated: Patient Education method: Explanation, Demonstration, and Handouts Education comprehension: verbalized understanding and returned demonstration   HOME EXERCISE PROGRAM:  Access Code: [email or web address] URL: [email or web address] Date: 04/18/2023 Prepared by: Consuela Mimes  Exercises - Supine Quad Set  - 3 x daily - 7 x weekly - 2 sets - 10 reps - 5sec hold - Supine Heel Slide with Strap  - 2 x daily - 7 x weekly - 2 sets - 10 reps - 5sec hold - Seated Long Arc Quad  - 2 x daily - 7 x weekly - 2 sets - 10 reps - Supine Ankle Pumps  - 3 x daily - 7 x weekly - 2 sets - 10  reps   ASSESSMENT:  CLINICAL IMPRESSION: Pt demonstrates good R knee ROM  at this time; minimal deficit actively and full knee ROM passively. She has made modest progress with quadriceps strength. Pain scale has improved significantly (> MCID). Pt is now almost completely weaned from using AD. She will use shopping cart or cane for long-distance shopping/walking. She exhibits safe gait pattern with minimal gait deviations for > 660 feet; she does have mild terminal knee extension deficit at terminal swing - this is corrected with verbal cueing. Pt is making excellent progress to date and is approaching late-phase/advanced rehab at this time; discharge is expected following next 3-4 weeks. Pt has remaining impairments in R knee stiffness, patellar mobility, quad and hip flexor strength; pt also needs work on fully rehabbing functional activity performance e.g. stepping up/down, transferring, and obstacle negotiation. Pt will continue to benefit from skilled PT services to address deficits and improve function.   OBJECTIVE IMPAIRMENTS: Abnormal gait, decreased balance, difficulty walking, decreased ROM, decreased strength, hypomobility, increased edema, impaired flexibility, and pain.   ACTIVITY LIMITATIONS: carrying, lifting, bending, sitting, standing, squatting, stairs, transfers, bed mobility, toileting, dressing, and locomotion level  PARTICIPATION LIMITATIONS: meal prep, cleaning, laundry, driving, shopping, community activity, and crafting/gardening  PERSONAL FACTORS: Age and involved orthopedic history are also affecting patient's functional outcome.   REHAB POTENTIAL: Good  CLINICAL DECISION MAKING: Evolving/moderate complexity  EVALUATION COMPLEXITY: Moderate   GOALS: Goals reviewed with patient? Yes  SHORT TERM GOALS: Target date: 05/03/2023  Pt will be independent with HEP to improve strength and decrease knee pain to improve pain-free function at home and work. Baseline: 04/09/23: Baseline HEP initiated.   05/09/23: Compliant with HEP, pt proficient with  exercises Goal status: ACHIEVED  Pt will improve knee flexion ROM to 0-120 deg as needed for improved ability to perform transferring from surfaces of various height, self-care ADLs e.g. dressing/hygiene Baseline: 04/09/23: AROM -3-81     05/09/23: L knee AROM -1-115 (passively -1-120) Goal status: IN PROGRESS/MOSTLY MET  LONG TERM GOALS: Target date: 06/07/2023  Pt will increase FOTO to at least 51 to demonstrate significant improvement in function at home and work related to knee pain  Baseline: 04/09/23: 21   05/09/23:  67/51 Goal status: ACHIEVED  2.  Pt will decrease worst knee pain by at least 3 points on the NPRS in order to demonstrate clinically significant reduction in knee pain. Baseline: 04/09/23: 9-10/10 at worst    05/09/23: 4/10 at worst Goal status: ACHIEVED  3.  Pt will ambulate with no AD for 660 ft or greater without significant gait deviation, LOB, or increase in knee pain > 1-2/10 as needed for community-level gait      Baseline: 04/09/23: Limited household distance tolerated, using FWW at this time.    05/09/23: Pt able to ambulate >660 feet safely with only mild dec terminal knee extension at terminal swing Goal status: IN PROGRESS/MOSTLY MET   4.  Pt will increase strength of tested LE musculature to 4+/5 MMT grade or greater in order to demonstrate improvement in strength and function  Baseline: 04/09/23: Hip flexor and knee strength 2-4/5, good seated hip ABD/ADD.    05/09/23: Not met for quad strength and hip flexor strength;  Goal status: NOT MET    PLAN: PT FREQUENCY: 1-2x/week  PT DURATION: 3-4 weeks  PLANNED INTERVENTIONS: Therapeutic exercises, Therapeutic activity, Neuromuscular re-education, Balance training, Gait training, Patient/Family education, Self Care, Joint mobilization, Orthotic/Fit training, DME instructions, Electrical Stimulation, Cryotherapy, Manual therapy, and  Re-evaluation.  PLAN FOR NEXT SESSION: Continue with quad, hip flexor, and  gluteal strengthening. Progress with CKC strengthening, stepping/weightbearing activities, re-training for stairs/obstacles and community-level mobility.    Consuela Mimes, PT, DPT #[id number]  Gertie Exon, PT 05/09/2023, 10:37 AM

## 2023-05-09 ENCOUNTER — Ambulatory Visit: Payer: Medicare Other | Admitting: Physical Therapy

## 2023-05-09 ENCOUNTER — Encounter: Payer: Self-pay | Admitting: Physical Therapy

## 2023-05-09 DIAGNOSIS — M6281 Muscle weakness (generalized): Secondary | ICD-10-CM

## 2023-05-09 DIAGNOSIS — R262 Difficulty in walking, not elsewhere classified: Secondary | ICD-10-CM

## 2023-05-09 DIAGNOSIS — M25561 Pain in right knee: Secondary | ICD-10-CM

## 2023-05-09 DIAGNOSIS — M25661 Stiffness of right knee, not elsewhere classified: Secondary | ICD-10-CM

## 2023-05-14 ENCOUNTER — Ambulatory Visit: Payer: Medicare Other | Attending: Orthopedic Surgery | Admitting: Physical Therapy

## 2023-05-14 ENCOUNTER — Encounter: Payer: Self-pay | Admitting: Physical Therapy

## 2023-05-14 DIAGNOSIS — R262 Difficulty in walking, not elsewhere classified: Secondary | ICD-10-CM | POA: Diagnosis present

## 2023-05-14 DIAGNOSIS — M6281 Muscle weakness (generalized): Secondary | ICD-10-CM

## 2023-05-14 DIAGNOSIS — M25661 Stiffness of right knee, not elsewhere classified: Secondary | ICD-10-CM | POA: Diagnosis present

## 2023-05-14 DIAGNOSIS — M25561 Pain in right knee: Secondary | ICD-10-CM | POA: Diagnosis present

## 2023-05-14 NOTE — Therapy (Signed)
OUTPATIENT PHYSICAL THERAPY TREATMENT    Patient Name: Colleen Price MRN: [id number] DOB:February 15, 1956, 67 y.o., female Today's Date: 05/14/2023  END OF SESSION:  PT End of Session - 05/14/23 1033     Visit Number 11    Number of Visits 17    Date for PT Re-Evaluation 06/04/23    Authorization Type Medicare A&B; [employer] supplemental    Progress Note Due on Visit 10    PT Start Time 1030    PT Stop Time 1113    PT Time Calculation (min) 43 min    Activity Tolerance Patient tolerated treatment well;No increased pain    Behavior During Therapy WFL for tasks assessed/performed               Past Medical History:  Diagnosis Date   Arthritis    KNEES   Cancer (HCC)    Complication of anesthesia    PONV (postoperative nausea and vomiting)    PT STATES SCOPALAMINE PATCH HELPED WITH HER LAST SURGERY   Past Surgical History:  Procedure Laterality Date   ABDOMINAL HYSTERECTOMY  1990   BREAST LUMPECTOMY WITH SENTINEL LYMPH NODE BIOPSY Right 10/14/2015   Procedure: BREAST LUMPECTOMY WITH SENTINEL LYMPH NODE BX;  Surgeon: Kieth Brightly, MD;  Location: [facility];  Service: General;  Laterality: Right;   CATARACT EXTRACTION W/PHACO Right 01/23/2018   Procedure: CATARACT EXTRACTION PHACO AND INTRAOCULAR LENS PLACEMENT (IOC)  RIGHT;  Surgeon: Lockie Mola, MD;  Location: [facility];  Service: Ophthalmology;  Laterality: Right;   CATARACT EXTRACTION W/PHACO Left 02/13/2018   Procedure: CATARACT EXTRACTION PHACO AND INTRAOCULAR LENS PLACEMENT (IOC) LEFT;  Surgeon: Lockie Mola, MD;  Location: [facility];  Service: Ophthalmology;  Laterality: Left;   COLONOSCOPY     KNEE ARTHROSCOPY     Patient Active Problem List   Diagnosis Date Noted   Vaccine refused by patient 12/01/2022   Generalized osteoarthritis of multiple sites 11/03/2022   History of breast cancer in female 11/03/2022   Osteopenia of neck of femur 11/03/2022   History of anemia 11/03/2022    Persistent cough 11/03/2022   Multilevel degenerative disc disease 10/17/2021   Osteoarthritis of knee 10/17/2021   Tear of medial meniscus of knee, current 10/17/2021   Primary localized osteoarthritis of pelvic region and thigh 10/17/2021    PCP: Remo Lipps, PA  REFERRING PROVIDER: Sheral Apley, MD  REFERRING DIAG: [phone] (ICD-10-CM) - S/P TKR (total knee replacement), righ   RATIONALE FOR EVALUATION AND TREATMENT: Rehabilitation  THERAPY DIAG: Acute pain of right knee  Stiffness of right knee, not elsewhere classified  Difficulty in walking, not elsewhere classified  Muscle weakness (generalized)  ONSET DATE: R TKA, DOS 04/05/23  FOLLOW-UP APPT SCHEDULED WITH REFERRING PROVIDER: Yes ; 04/19/23  PERTINENT HISTORY: Pt is a 67 year old female s/p R TKA 04/05/23. Patient reports no significant post-op complications. She and her husband report being off of pain medicine regimen last evening and this AM - more pain this AM due to this. Pt is using ice machine at home often. Patient is using FWW for mobility at the time. Pt reports difficulty with moving LE to/from bed. Patient reports she is doing better with getting onto commode at this time.   PAIN:   Pain Intensity: Present: 7/10, Worst: 9-10/10 Pain location: R knee Pain quality: sharp and burning   Swelling: Yes ; expected post-op swelling Numbness/Tingling: Yes; some anterior knee/peri-incisional sensory change Focal weakness or buckling: Yes; weakness in antigravity mm  for RLE Aggravating factors: attempting to lift surgical LE on/off bed, car transfers Relieving factors: pain medicine, Tylenol, ice History of prior back, hip, or knee injury, pain, surgery, or therapy: Yes; Hx of meniscus tear with arthroscopy/partial meniscectomy   Imaging: No  Prior level of function: Independent with community mobility without device Occupational demands: Retired Presenter, broadcasting: Clinical cytogeneticist, gardening/flower bed, active  lifestyle   Red flags: Negative for personal history of cancer, chills/fever, night sweats, nausea, vomiting, unexplained weight gain/loss, unrelenting pain  PRECAUTIONS: None  WEIGHT BEARING RESTRICTIONS: Yes WBAT  FALLS: Has patient fallen in last 6 months? No  Living Environment Lives with: lives with their spouse; her sister lives locally and is able to help Lives in: House/apartment 2 steps to get into home - step down into family room (2 steps) - no handrail for home entrance; home is one level. [place] to walk into home; concrete sidewalk to get to/from car.   Patient Goals: Able to get back to normal community-level mobility, dec R knee pain   OBJECTIVE:    Gross Musculoskeletal Assessment Tremor: None Bulk: R quadriceps atrophy Tone: Normal  GAIT: Distance walked: 30 ft Assistive device utilized: Walker - 2 wheeled Level of assistance: CGA Comments: Decreased R knee terminal extension at terminal swing   AROM AROM (Normal range in degrees) AROM 04/09/23 AROM 05/09/23  Hip Right Left Right Left  Flexion (125)      Extension (15)      Abduction (40)      Adduction       Internal Rotation (45)      External Rotation (45)            Knee      Flexion (135) 81 130 115 130  Extension (0) -3 +3 -1 +3        Ankle      Dorsiflexion (20) WNL WNL WNL WNL  Plantarflexion (50)      Inversion (35)      Eversion (15      (* = pain; Blank rows = not tested)   05/09/23: PROM R knee:  -1-123    LE MMT: MMT (out of 5) Right 04/09/23 Left 04/09/23 Right 05/09/23 Left 05/09/23  Hip flexion 2 4 4- 4  Hip extension      Hip abduction (seated) 5 5 5 5   Hip adduction (seated) 5 5 5 5   Hip internal rotation      Hip external rotation      Knee flexion 4 5 4+ 5  Knee extension 2 4 4* 4+  Ankle dorsiflexion      Ankle plantarflexion      Ankle inversion      Ankle eversion      (* = pain; Blank rows = not tested)  Sensation Only altered sensation  peri-incisional region  Reflexes Deferred  Muscle Length Deferred  Palpation Location LEFT  RIGHT           Quadriceps    Medial Hamstrings    Lateral Hamstrings    Lateral Hamstring tendon    Medial Hamstring tendon    Quadriceps tendon  0  Patella  1  Patellar Tendon  1  Tibial Tuberosity  1  Medial joint line  2  Lateral joint line  2  MCL    LCL    Adductor Tubercle    Pes Anserine tendon    Infrapatellar fat pad    Fibular head    Popliteal fossa    (  Blank rows = not tested) Graded on 0-4 scale (0 = no pain, 1 = pain, 2 = pain with wincing/grimacing/flinching, 3 = pain with withdrawal, 4 = unwilling to allow palpation), (Blank rows = not tested)     TODAY'S TREATMENT:   SUBJECTIVE STATEMENT:   Pt is back to driving and completed errands this AM - shopping at [employer]. Patient reports 2/10 pain at arrival.     Manual Therapy - for symptom modulation, soft tissue sensitivity and mobility, joint mobility, ROM   Patellar mobilization 2 x 30 sec bouts, emphasis on superior and inferior glides R knee PROM as tolerated; x 5 minutes into flexion and extension in supine with gentle overpressure into flexion as tolerated Tibiofemoral mobilization; 2 x 30 second bouts each; gr II for pain control   R knee AROM 0-118 deg  R knee PROM 0-123 deg    *not today* STM and gentle IASTM with Theraband roller, R medial calf; x  5 minutes with pt in hooklying   Therapeutic Exercise - for improved soft tissue flexibility and extensibility as needed for ROM, improved strength as needed to improve performance of CKC  activities/functional movements   NuStep; Level 5, x 5 minutes; seat at 8 - for improved soft tissue mobility and increased tissue temperature to improve muscle performance   -subjective gathered during this time  SLR; 2x10; with 2-lb ankle weight  Long arc quad, edge of treatment table; 2x12, 3 sec hold   -with 5-lb AW  On blue agility ladder;  Dynamic march; 5x D/B length of bars  Forward step-up with opposite LE follow-through; 6-inch step; 2x10  Minisquat, in // bars; ROM as tolerated; x15 reps  Obstacle course in // bars: (3) 6-inch hurdles, 2 Airex pads; 4x D/B course   PATIENT EDUCATION: Discussed current progress and positive prognosis with current level of improvement and return of function.     *not today* Cold pack (unbilled) - for anti-inflammatory and analgesic effect as needed for reduced pain and improved ability to participate in active PT intervention, along R knee in supine with pillow under calf, x 5 minutes In // bars: Standing heel raise; 2x10 Forward lunge; 2x10 Short arc quad with green bolster; 2x8, 3 sec hold   -with 5-lb AW Heel slide with bedsheet; 1x10, 5 sec hold Dynamic march, forward stepping only; 5x D/B Standing march, in walker; 2x10 alternating R/L Supine quad set; 1 x 10, 5 sec hold  SLR; 2 x 4  Ball squeeze hip adductor isometric; 2 x 10 5 sec Supine hip abduction, Blue Tband; 2x10      PATIENT EDUCATION:  Education details: see above for patient education details Person educated: Patient Education method: Explanation, Demonstration, and Handouts Education comprehension: verbalized understanding and returned demonstration   HOME EXERCISE PROGRAM:  Access Code: [email or web address] URL: [email or web address] Date: 04/18/2023 Prepared by: Consuela Mimes  Exercises - Supine Quad Set  - 3 x daily - 7 x weekly - 2 sets - 10 reps - 5sec hold - Supine Heel Slide with Strap  - 2 x daily - 7 x weekly - 2 sets - 10 reps - 5sec hold - Seated Long Arc Quad  - 2 x daily - 7 x weekly - 2 sets - 10 reps - Supine Ankle Pumps  - 3 x daily - 7 x weekly - 2 sets - 10 reps   ASSESSMENT:  CLINICAL IMPRESSION: Pt demonstrates good R knee ROM and is doing very well with performance of  stepping up, stepping over obstacles, and negotiating uneven/compliant terrain. Patient is able to  complete shopping around [employer] without pain or LOB; pt does use shopping cart, which can provide added support. Pt has remaining impairments in R knee stiffness, patellar mobility, quad and hip flexor strength; pt also needs work on fully rehabbing functional activity performance e.g. stepping up/down, transferring, and obstacle negotiation. Pt will continue to benefit from skilled PT services to address deficits and improve function.   OBJECTIVE IMPAIRMENTS: Abnormal gait, decreased balance, difficulty walking, decreased ROM, decreased strength, hypomobility, increased edema, impaired flexibility, and pain.   ACTIVITY LIMITATIONS: carrying, lifting, bending, sitting, standing, squatting, stairs, transfers, bed mobility, toileting, dressing, and locomotion level  PARTICIPATION LIMITATIONS: meal prep, cleaning, laundry, driving, shopping, community activity, and crafting/gardening  PERSONAL FACTORS: Age and involved orthopedic history are also affecting patient's functional outcome.   REHAB POTENTIAL: Good  CLINICAL DECISION MAKING: Evolving/moderate complexity  EVALUATION COMPLEXITY: Moderate   GOALS: Goals reviewed with patient? Yes  SHORT TERM GOALS: Target date: 05/03/2023  Pt will be independent with HEP to improve strength and decrease knee pain to improve pain-free function at home and work. Baseline: 04/09/23: Baseline HEP initiated.   05/09/23: Compliant with HEP, pt proficient with exercises Goal status: ACHIEVED  Pt will improve knee flexion ROM to 0-120 deg as needed for improved ability to perform transferring from surfaces of various height, self-care ADLs e.g. dressing/hygiene Baseline: 04/09/23: AROM -3-81     05/09/23: L knee AROM -1-115 (passively -1-120) Goal status: IN PROGRESS/MOSTLY MET  LONG TERM GOALS: Target date: 06/07/2023  Pt will increase FOTO to at least 51 to demonstrate significant improvement in function at home and work related to knee  pain  Baseline: 04/09/23: 21   05/09/23:  67/51 Goal status: ACHIEVED  2.  Pt will decrease worst knee pain by at least 3 points on the NPRS in order to demonstrate clinically significant reduction in knee pain. Baseline: 04/09/23: 9-10/10 at worst    05/09/23: 4/10 at worst Goal status: ACHIEVED  3.  Pt will ambulate with no AD for 660 ft or greater without significant gait deviation, LOB, or increase in knee pain > 1-2/10 as needed for community-level gait      Baseline: 04/09/23: Limited household distance tolerated, using FWW at this time.    05/09/23: Pt able to ambulate >660 feet safely with only mild dec terminal knee extension at terminal swing Goal status: IN PROGRESS/MOSTLY MET   4.  Pt will increase strength of tested LE musculature to 4+/5 MMT grade or greater in order to demonstrate improvement in strength and function  Baseline: 04/09/23: Hip flexor and knee strength 2-4/5, good seated hip ABD/ADD.    05/09/23: Not met for quad strength and hip flexor strength;  Goal status: NOT MET    PLAN: PT FREQUENCY: 1-2x/week  PT DURATION: 3-4 weeks  PLANNED INTERVENTIONS: Therapeutic exercises, Therapeutic activity, Neuromuscular re-education, Balance training, Gait training, Patient/Family education, Self Care, Joint mobilization, Orthotic/Fit training, DME instructions, Electrical Stimulation, Cryotherapy, Manual therapy, and Re-evaluation.  PLAN FOR NEXT SESSION: Continue with quad, hip flexor, and gluteal strengthening. Progress with CKC strengthening, stepping/weightbearing activities, re-training for stairs/obstacles and community-level mobility.    Consuela Mimes, PT, DPT #[id number]  Gertie Exon, PT 05/14/2023, 11:56 AM

## 2023-05-16 ENCOUNTER — Ambulatory Visit: Payer: Medicare Other | Admitting: Physical Therapy

## 2023-05-16 DIAGNOSIS — M6281 Muscle weakness (generalized): Secondary | ICD-10-CM

## 2023-05-16 DIAGNOSIS — M25661 Stiffness of right knee, not elsewhere classified: Secondary | ICD-10-CM

## 2023-05-16 DIAGNOSIS — M25561 Pain in right knee: Secondary | ICD-10-CM

## 2023-05-16 DIAGNOSIS — R262 Difficulty in walking, not elsewhere classified: Secondary | ICD-10-CM

## 2023-05-16 NOTE — Therapy (Signed)
OUTPATIENT PHYSICAL THERAPY TREATMENT    Patient Name: Colleen Price MRN: [id number] DOB:27-Aug-1955, 67 y.o., female Today's Date: 05/16/2023  END OF SESSION:  PT End of Session - 05/16/23 1036     Visit Number 12    Number of Visits 17    Date for PT Re-Evaluation 06/04/23    Authorization Type Medicare A&B; [employer] supplemental    Progress Note Due on Visit 10    PT Start Time 1035    PT Stop Time 1110    PT Time Calculation (min) 35 min    Activity Tolerance Patient tolerated treatment well;No increased pain    Behavior During Therapy WFL for tasks assessed/performed                Past Medical History:  Diagnosis Date   Arthritis    KNEES   Cancer (HCC)    Complication of anesthesia    PONV (postoperative nausea and vomiting)    PT STATES SCOPALAMINE PATCH HELPED WITH HER LAST SURGERY   Past Surgical History:  Procedure Laterality Date   ABDOMINAL HYSTERECTOMY  1990   BREAST LUMPECTOMY WITH SENTINEL LYMPH NODE BIOPSY Right 10/14/2015   Procedure: BREAST LUMPECTOMY WITH SENTINEL LYMPH NODE BX;  Surgeon: Kieth Brightly, MD;  Location: [facility];  Service: General;  Laterality: Right;   CATARACT EXTRACTION W/PHACO Right 01/23/2018   Procedure: CATARACT EXTRACTION PHACO AND INTRAOCULAR LENS PLACEMENT (IOC)  RIGHT;  Surgeon: Lockie Mola, MD;  Location: [facility];  Service: Ophthalmology;  Laterality: Right;   CATARACT EXTRACTION W/PHACO Left 02/13/2018   Procedure: CATARACT EXTRACTION PHACO AND INTRAOCULAR LENS PLACEMENT (IOC) LEFT;  Surgeon: Lockie Mola, MD;  Location: [facility];  Service: Ophthalmology;  Laterality: Left;   COLONOSCOPY     KNEE ARTHROSCOPY     Patient Active Problem List   Diagnosis Date Noted   Vaccine refused by patient 12/01/2022   Generalized osteoarthritis of multiple sites 11/03/2022   History of breast cancer in female 11/03/2022   Osteopenia of neck of femur 11/03/2022   History of anemia  11/03/2022   Persistent cough 11/03/2022   Multilevel degenerative disc disease 10/17/2021   Osteoarthritis of knee 10/17/2021   Tear of medial meniscus of knee, current 10/17/2021   Primary localized osteoarthritis of pelvic region and thigh 10/17/2021    PCP: Remo Lipps, PA  REFERRING PROVIDER: Sheral Apley, MD  REFERRING DIAG: [phone] (ICD-10-CM) - S/P TKR (total knee replacement), righ   RATIONALE FOR EVALUATION AND TREATMENT: Rehabilitation  THERAPY DIAG: Acute pain of right knee  Stiffness of right knee, not elsewhere classified  Difficulty in walking, not elsewhere classified  Muscle weakness (generalized)  ONSET DATE: R TKA, DOS 04/05/23  FOLLOW-UP APPT SCHEDULED WITH REFERRING PROVIDER: Yes ; 04/19/23  PERTINENT HISTORY: Pt is a 68 year old female s/p R TKA 04/05/23. Patient reports no significant post-op complications. She and her husband report being off of pain medicine regimen last evening and this AM - more pain this AM due to this. Pt is using ice machine at home often. Patient is using FWW for mobility at the time. Pt reports difficulty with moving LE to/from bed. Patient reports she is doing better with getting onto commode at this time.   PAIN:   Pain Intensity: Present: 7/10, Worst: 9-10/10 Pain location: R knee Pain quality: sharp and burning   Swelling: Yes ; expected post-op swelling Numbness/Tingling: Yes; some anterior knee/peri-incisional sensory change Focal weakness or buckling: Yes; weakness in antigravity  mm for RLE Aggravating factors: attempting to lift surgical LE on/off bed, car transfers Relieving factors: pain medicine, Tylenol, ice History of prior back, hip, or knee injury, pain, surgery, or therapy: Yes; Hx of meniscus tear with arthroscopy/partial meniscectomy   Imaging: No  Prior level of function: Independent with community mobility without device Occupational demands: Retired Presenter, broadcasting: Clinical cytogeneticist, gardening/flower bed,  active lifestyle   Red flags: Negative for personal history of cancer, chills/fever, night sweats, nausea, vomiting, unexplained weight gain/loss, unrelenting pain  PRECAUTIONS: None  WEIGHT BEARING RESTRICTIONS: Yes WBAT  FALLS: Has patient fallen in last 6 months? No  Living Environment Lives with: lives with their spouse; her sister lives locally and is able to help Lives in: House/apartment 2 steps to get into home - step down into family room (2 steps) - no handrail for home entrance; home is one level. [place] to walk into home; concrete sidewalk to get to/from car.   Patient Goals: Able to get back to normal community-level mobility, dec R knee pain   OBJECTIVE:    Gross Musculoskeletal Assessment Tremor: None Bulk: R quadriceps atrophy Tone: Normal  GAIT: Distance walked: 30 ft Assistive device utilized: Walker - 2 wheeled Level of assistance: CGA Comments: Decreased R knee terminal extension at terminal swing   AROM AROM (Normal range in degrees) AROM 04/09/23 AROM 05/09/23  Hip Right Left Right Left  Flexion (125)      Extension (15)      Abduction (40)      Adduction       Internal Rotation (45)      External Rotation (45)            Knee      Flexion (135) 81 130 115 130  Extension (0) -3 +3 -1 +3        Ankle      Dorsiflexion (20) WNL WNL WNL WNL  Plantarflexion (50)      Inversion (35)      Eversion (15      (* = pain; Blank rows = not tested)   05/09/23: PROM R knee:  -1-123    LE MMT: MMT (out of 5) Right 04/09/23 Left 04/09/23 Right 05/09/23 Left 05/09/23  Hip flexion 2 4 4- 4  Hip extension      Hip abduction (seated) 5 5 5 5   Hip adduction (seated) 5 5 5 5   Hip internal rotation      Hip external rotation      Knee flexion 4 5 4+ 5  Knee extension 2 4 4* 4+  Ankle dorsiflexion      Ankle plantarflexion      Ankle inversion      Ankle eversion      (* = pain; Blank rows = not tested)  Sensation Only altered sensation  peri-incisional region  Reflexes Deferred  Muscle Length Deferred  Palpation Location LEFT  RIGHT           Quadriceps    Medial Hamstrings    Lateral Hamstrings    Lateral Hamstring tendon    Medial Hamstring tendon    Quadriceps tendon  0  Patella  1  Patellar Tendon  1  Tibial Tuberosity  1  Medial joint line  2  Lateral joint line  2  MCL    LCL    Adductor Tubercle    Pes Anserine tendon    Infrapatellar fat pad    Fibular head    Popliteal fossa    (  Blank rows = not tested) Graded on 0-4 scale (0 = no pain, 1 = pain, 2 = pain with wincing/grimacing/flinching, 3 = pain with withdrawal, 4 = unwilling to allow palpation), (Blank rows = not tested)     TODAY'S TREATMENT:   SUBJECTIVE STATEMENT:   Pt reports more soreness this AM. 2-2.5/10 NPRS. Patient reports completing high volume of walking yesterday. Pt has f/u with MD after PT today.     Manual Therapy - for symptom modulation, soft tissue sensitivity and mobility, joint mobility, ROM   Patellar mobilization 2 x 30 sec bouts, emphasis on superior and inferior glides R knee PROM as tolerated; x 5 minutes into flexion and extension in supine with gentle overpressure into flexion as tolerated Tibiofemoral mobilization; 2 x 30 second bouts each; gr II for pain control   R knee AROM 0-119 deg    *not today* STM and gentle IASTM with Theraband roller, R medial calf; x  5 minutes with pt in hooklying   Therapeutic Exercise - for improved soft tissue flexibility and extensibility as needed for ROM, improved strength as needed to improve performance of CKC  activities/functional movements    SLR; 2x10; with 3-lb ankle weight  Long arc quad, edge of treatment table; 2x12, 3 sec hold   -with 7.5-lb AW  On blue agility ladder; Dynamic march; 5x D/B length of bars  Forward step-up with opposite LE follow-through; 6-inch step; 2x10  Minisquat, in // bars; ROM as tolerated; x15 reps  Obstacle course in //  bars: (3) 6-inch hurdles, 2 Airex pads; 4x D/B course   PATIENT EDUCATION: Discussed current progress and pt approaching end-phase of PT.     *not today* NuStep; Level 5, x 5 minutes; seat at 8 - for improved soft tissue mobility and increased tissue temperature to improve muscle performance  Cold pack (unbilled) - for anti-inflammatory and analgesic effect as needed for reduced pain and improved ability to participate in active PT intervention, along R knee in supine with pillow under calf, x 5 minutes In // bars: Standing heel raise; 2x10 Forward lunge; 2x10 Short arc quad with green bolster; 2x8, 3 sec hold   -with 5-lb AW Heel slide with bedsheet; 1x10, 5 sec hold Dynamic march, forward stepping only; 5x D/B Standing march, in walker; 2x10 alternating R/L Supine quad set; 1 x 10, 5 sec hold  SLR; 2 x 4  Ball squeeze hip adductor isometric; 2 x 10 5 sec Supine hip abduction, Blue Tband; 2x10      PATIENT EDUCATION:  Education details: see above for patient education details Person educated: Patient Education method: Explanation, Demonstration, and Handouts Education comprehension: verbalized understanding and returned demonstration   HOME EXERCISE PROGRAM:  Access Code: [email or web address] URL: [email or web address] Date: 04/18/2023 Prepared by: Consuela Mimes  Exercises - Supine Quad Set  - 3 x daily - 7 x weekly - 2 sets - 10 reps - 5sec hold - Supine Heel Slide with Strap  - 2 x daily - 7 x weekly - 2 sets - 10 reps - 5sec hold - Seated Long Arc Quad  - 2 x daily - 7 x weekly - 2 sets - 10 reps - Supine Ankle Pumps  - 3 x daily - 7 x weekly - 2 sets - 10 reps   ASSESSMENT:  CLINICAL IMPRESSION: Pt does not have significant deficit with community-level mobility at this time, and she has normalizing R knee ROM with only mild flexion deficit today. She is doing well with  open and closed-chain strengthening and is able to perform most of her ADLs and community  errands without difficulty. Pt is following up with surgeon after PT today. Pt has remaining impairments in R knee stiffness, patellar mobility, quad and hip flexor strength; pt also needs work on fully rehabbing functional activity performance e.g. stepping up/down, transferring, and obstacle negotiation. Pt will continue to benefit from skilled PT services to address deficits and improve function.   OBJECTIVE IMPAIRMENTS: Abnormal gait, decreased balance, difficulty walking, decreased ROM, decreased strength, hypomobility, increased edema, impaired flexibility, and pain.   ACTIVITY LIMITATIONS: carrying, lifting, bending, sitting, standing, squatting, stairs, transfers, bed mobility, toileting, dressing, and locomotion level  PARTICIPATION LIMITATIONS: meal prep, cleaning, laundry, driving, shopping, community activity, and crafting/gardening  PERSONAL FACTORS: Age and involved orthopedic history are also affecting patient's functional outcome.   REHAB POTENTIAL: Good  CLINICAL DECISION MAKING: Evolving/moderate complexity  EVALUATION COMPLEXITY: Moderate   GOALS: Goals reviewed with patient? Yes  SHORT TERM GOALS: Target date: 05/03/2023  Pt will be independent with HEP to improve strength and decrease knee pain to improve pain-free function at home and work. Baseline: 04/09/23: Baseline HEP initiated.   05/09/23: Compliant with HEP, pt proficient with exercises Goal status: ACHIEVED  Pt will improve knee flexion ROM to 0-120 deg as needed for improved ability to perform transferring from surfaces of various height, self-care ADLs e.g. dressing/hygiene Baseline: 04/09/23: AROM -3-81     05/09/23: L knee AROM -1-115 (passively -1-120) Goal status: IN PROGRESS/MOSTLY MET  LONG TERM GOALS: Target date: 06/07/2023  Pt will increase FOTO to at least 51 to demonstrate significant improvement in function at home and work related to knee pain  Baseline: 04/09/23: 21   05/09/23:   67/51 Goal status: ACHIEVED  2.  Pt will decrease worst knee pain by at least 3 points on the NPRS in order to demonstrate clinically significant reduction in knee pain. Baseline: 04/09/23: 9-10/10 at worst    05/09/23: 4/10 at worst Goal status: ACHIEVED  3.  Pt will ambulate with no AD for 660 ft or greater without significant gait deviation, LOB, or increase in knee pain > 1-2/10 as needed for community-level gait      Baseline: 04/09/23: Limited household distance tolerated, using FWW at this time.    05/09/23: Pt able to ambulate >660 feet safely with only mild dec terminal knee extension at terminal swing Goal status: IN PROGRESS/MOSTLY MET   4.  Pt will increase strength of tested LE musculature to 4+/5 MMT grade or greater in order to demonstrate improvement in strength and function  Baseline: 04/09/23: Hip flexor and knee strength 2-4/5, good seated hip ABD/ADD.    05/09/23: Not met for quad strength and hip flexor strength;  Goal status: NOT MET    PLAN: PT FREQUENCY: 1-2x/week  PT DURATION: 3-4 weeks  PLANNED INTERVENTIONS: Therapeutic exercises, Therapeutic activity, Neuromuscular re-education, Balance training, Gait training, Patient/Family education, Self Care, Joint mobilization, Orthotic/Fit training, DME instructions, Electrical Stimulation, Cryotherapy, Manual therapy, and Re-evaluation.  PLAN FOR NEXT SESSION: Continue with quad, hip flexor, and gluteal strengthening. Progress with CKC strengthening, stepping/weightbearing activities, re-training for stairs/obstacles and community-level mobility.    Consuela Mimes, PT, DPT #[id number]  Gertie Exon, PT 05/16/2023, 11:09 AM

## 2023-05-21 ENCOUNTER — Ambulatory Visit: Payer: Medicare Other | Admitting: Physical Therapy

## 2023-05-21 ENCOUNTER — Encounter: Payer: Self-pay | Admitting: Physical Therapy

## 2023-05-21 DIAGNOSIS — R262 Difficulty in walking, not elsewhere classified: Secondary | ICD-10-CM

## 2023-05-21 DIAGNOSIS — M25561 Pain in right knee: Secondary | ICD-10-CM

## 2023-05-21 DIAGNOSIS — M25661 Stiffness of right knee, not elsewhere classified: Secondary | ICD-10-CM

## 2023-05-21 DIAGNOSIS — M6281 Muscle weakness (generalized): Secondary | ICD-10-CM

## 2023-05-21 NOTE — Therapy (Signed)
OUTPATIENT PHYSICAL THERAPY TREATMENT    Patient Name: Colleen Price MRN: [id number] DOB:[birth year], 67 y.o., female Today's Date: 05/21/2023  END OF SESSION:  PT End of Session - 05/21/23 1023     Visit Number 13    Number of Visits 17    Date for PT Re-Evaluation 06/04/23    Authorization Type Medicare A&B; AARP supplemental    Progress Note Due on Visit 10    PT Start Time 1024    PT Stop Time 1105    PT Time Calculation (min) 41 min    Activity Tolerance Patient tolerated treatment well;No increased pain    Behavior During Therapy WFL for tasks assessed/performed                 Past Medical History:  Diagnosis Date   Arthritis    KNEES   Cancer (HCC)    Complication of anesthesia    PONV (postoperative nausea and vomiting)    PT STATES SCOPALAMINE PATCH HELPED WITH HER LAST SURGERY   Past Surgical History:  Procedure Laterality Date   ABDOMINAL HYSTERECTOMY  1990   BREAST LUMPECTOMY WITH SENTINEL LYMPH NODE BIOPSY Right 10/14/2015   Procedure: BREAST LUMPECTOMY WITH SENTINEL LYMPH NODE BX;  Surgeon: Kieth Brightly, MD;  Location: [facility];  Service: General;  Laterality: Right;   CATARACT EXTRACTION W/PHACO Right 01/23/2018   Procedure: CATARACT EXTRACTION PHACO AND INTRAOCULAR LENS PLACEMENT (IOC)  RIGHT;  Surgeon: Lockie Mola, MD;  Location: [facility];  Service: Ophthalmology;  Laterality: Right;   CATARACT EXTRACTION W/PHACO Left 02/13/2018   Procedure: CATARACT EXTRACTION PHACO AND INTRAOCULAR LENS PLACEMENT (IOC) LEFT;  Surgeon: Lockie Mola, MD;  Location: [facility];  Service: Ophthalmology;  Laterality: Left;   COLONOSCOPY     KNEE ARTHROSCOPY     Patient Active Problem List   Diagnosis Date Noted   Vaccine refused by patient 12/01/2022   Generalized osteoarthritis of multiple sites 11/03/2022   History of breast cancer in female 11/03/2022   Osteopenia of neck of femur 11/03/2022   History of anemia  11/03/2022   Persistent cough 11/03/2022   Multilevel degenerative disc disease 10/17/2021   Osteoarthritis of knee 10/17/2021   Tear of medial meniscus of knee, current 10/17/2021   Primary localized osteoarthritis of pelvic region and thigh 10/17/2021    PCP: Remo Lipps, PA  REFERRING PROVIDER: Sheral Apley, MD  REFERRING DIAG: [phone] (ICD-10-CM) - S/P TKR (total knee replacement), righ   RATIONALE FOR EVALUATION AND TREATMENT: Rehabilitation  THERAPY DIAG: Acute pain of right knee  Stiffness of right knee, not elsewhere classified  Difficulty in walking, not elsewhere classified  Muscle weakness (generalized)  ONSET DATE: R TKA, DOS 04/05/23  FOLLOW-UP APPT SCHEDULED WITH REFERRING PROVIDER: Yes ; 04/19/23  PERTINENT HISTORY: Pt is a 67 year old female s/p R TKA 04/05/23. Patient reports no significant post-op complications. She and her husband report being off of pain medicine regimen last evening and this AM - more pain this AM due to this. Pt is using ice machine at home often. Patient is using FWW for mobility at the time. Pt reports difficulty with moving LE to/from bed. Patient reports she is doing better with getting onto commode at this time.   PAIN:   Pain Intensity: Present: 7/10, Worst: 9-10/10 Pain location: R knee Pain quality: sharp and burning   Swelling: Yes ; expected post-op swelling Numbness/Tingling: Yes; some anterior knee/peri-incisional sensory change Focal weakness or buckling: Yes; weakness in  antigravity mm for RLE Aggravating factors: attempting to lift surgical LE on/off bed, car transfers Relieving factors: pain medicine, Tylenol, ice History of prior back, hip, or knee injury, pain, surgery, or therapy: Yes; Hx of meniscus tear with arthroscopy/partial meniscectomy   Imaging: No  Prior level of function: Independent with community mobility without device Occupational demands: Retired Presenter, broadcasting: Clinical cytogeneticist, gardening/flower bed,  active lifestyle   Red flags: Negative for personal history of cancer, chills/fever, night sweats, nausea, vomiting, unexplained weight gain/loss, unrelenting pain  PRECAUTIONS: None  WEIGHT BEARING RESTRICTIONS: Yes WBAT  FALLS: Has patient fallen in last 6 months? No  Living Environment Lives with: lives with their spouse; her sister lives locally and is able to help Lives in: House/apartment 2 steps to get into home - step down into family room (2 steps) - no handrail for home entrance; home is one level. Gravel to walk into home; concrete sidewalk to get to/from car.   Patient Goals: Able to get back to normal community-level mobility, dec R knee pain   OBJECTIVE:    Gross Musculoskeletal Assessment Tremor: None Bulk: R quadriceps atrophy Tone: Normal  GAIT: Distance walked: 30 ft Assistive device utilized: Walker - 2 wheeled Level of assistance: CGA Comments: Decreased R knee terminal extension at terminal swing   AROM AROM (Normal range in degrees) AROM 04/09/23 AROM 05/09/23  Hip Right Left Right Left  Flexion (125)      Extension (15)      Abduction (40)      Adduction       Internal Rotation (45)      External Rotation (45)            Knee      Flexion (135) 81 130 115 130  Extension (0) -3 +3 -1 +3        Ankle      Dorsiflexion (20) WNL WNL WNL WNL  Plantarflexion (50)      Inversion (35)      Eversion (15      (* = pain; Blank rows = not tested)   05/09/23: PROM R knee:  -1-123    LE MMT: MMT (out of 5) Right 04/09/23 Left 04/09/23 Right 05/09/23 Left 05/09/23  Hip flexion 2 4 4- 4  Hip extension      Hip abduction (seated) 5 5 5 5   Hip adduction (seated) 5 5 5 5   Hip internal rotation      Hip external rotation      Knee flexion 4 5 4+ 5  Knee extension 2 4 4* 4+  Ankle dorsiflexion      Ankle plantarflexion      Ankle inversion      Ankle eversion      (* = pain; Blank rows = not tested)  Sensation Only altered sensation  peri-incisional region  Reflexes Deferred  Muscle Length Deferred  Palpation Location LEFT  RIGHT           Quadriceps    Medial Hamstrings    Lateral Hamstrings    Lateral Hamstring tendon    Medial Hamstring tendon    Quadriceps tendon  0  Patella  1  Patellar Tendon  1  Tibial Tuberosity  1  Medial joint line  2  Lateral joint line  2  MCL    LCL    Adductor Tubercle    Pes Anserine tendon    Infrapatellar fat pad    Fibular head    Popliteal fossa    (  Blank rows = not tested) Graded on 0-4 scale (0 = no pain, 1 = pain, 2 = pain with wincing/grimacing/flinching, 3 = pain with withdrawal, 4 = unwilling to allow palpation), (Blank rows = not tested)     TODAY'S TREATMENT:   SUBJECTIVE STATEMENT:   Pt reports having good f/u with surgeon and not having her next post-op appointment until 1 year out. Patient reports some difficulties with soreness over last few days. She was told she would have some intermittent pain up to 6 months. Pt used Tylenol this AM, and her pain is around 2/10 presently. Patient reports some challenges with completion higher volume of standing activities/chores. Pt denies having issues with transfers. She states she mainly has to "not overdo it."     Manual Therapy - for symptom modulation, soft tissue sensitivity and mobility, joint mobility, ROM   Patellar mobilization 2 x 30 sec bouts, emphasis on superior and inferior glides R knee PROM as tolerated; x 5 minutes into flexion and extension in supine with gentle overpressure into flexion as tolerated Tibiofemoral mobilization; 2 x 30 second bouts each; gr II for pain control   R knee AROM 0-115 R knee PROM 0-122   *not today* STM and gentle IASTM with Theraband roller, R medial calf; x  5 minutes with pt in hooklying   Therapeutic Exercise - for improved soft tissue flexibility and extensibility as needed for ROM, improved strength as needed to improve performance of CKC   activities/functional movements   NuStep; Level 3, x 5 minutes - for improved soft tissue mobility and increased tissue temperature to improve muscle performance   -subjective gathered during this time   SLR; 2x10; with 4-lb ankle weight  Long arc quad, edge of treatment table; 2x12, 3 sec hold   -with 7.5-lb AW  On blue agility ladder; Dynamic march; 5x D/B length of bars  Forward step-up with opposite LE follow-through; 6-inch step; 2x10  Air squat with Goblet hold; 6-lb Medball; 2x10  Obstacle course in // bars: (3) 6-inch hurdles, 2 Airex pads; 5x D/B course   PATIENT EDUCATION: Discussed current POC and likely working toward completion of POC following next week's visits.     *not today* NuStep; Level 5, x 5 minutes; seat at 8 - for improved soft tissue mobility and increased tissue temperature to improve muscle performance  Cold pack (unbilled) - for anti-inflammatory and analgesic effect as needed for reduced pain and improved ability to participate in active PT intervention, along R knee in supine with pillow under calf, x 5 minutes In // bars: Standing heel raise; 2x10 Forward lunge; 2x10 Short arc quad with green bolster; 2x8, 3 sec hold   -with 5-lb AW Heel slide with bedsheet; 1x10, 5 sec hold Dynamic march, forward stepping only; 5x D/B Standing march, in walker; 2x10 alternating R/L Supine quad set; 1 x 10, 5 sec hold  SLR; 2 x 4  Ball squeeze hip adductor isometric; 2 x 10 5 sec Supine hip abduction, Blue Tband; 2x10      PATIENT EDUCATION:  Education details: see above for patient education details Person educated: Patient Education method: Explanation, Demonstration, and Handouts Education comprehension: verbalized understanding and returned demonstration   HOME EXERCISE PROGRAM:  Access Code: [email or web address] URL: [email or web address] Date: 04/18/2023 Prepared by: Consuela Mimes  Exercises - Supine Quad Set  - 3 x daily - 7 x weekly -  2 sets - 10 reps - 5sec hold - Supine Heel Slide with Strap  - 2  x daily - 7 x weekly - 2 sets - 10 reps - 5sec hold - Seated Long Arc Quad  - 2 x daily - 7 x weekly - 2 sets - 10 reps - Supine Ankle Pumps  - 3 x daily - 7 x weekly - 2 sets - 10 reps   ASSESSMENT:  CLINICAL IMPRESSION: Pt is making excellent progress and was told by her surgeon that completing PT was up to her and our office to decide. Pt is doing very well with knee ROM and LE strength. She had mild hip flexor and quad strength deficit remaining at last progress note. Pt has no significant functional mobility deficits at this time. She is nearing readiness for discharge; we discussed likely completing PT POC following next week's visits. Pt has remaining impairments in R knee stiffness, patellar mobility, quad and hip flexor strength; pt also needs work on fully rehabbing functional activity performance e.g. stepping up/down, transferring, and obstacle negotiation. Pt will continue to benefit from skilled PT services to address deficits and improve function.   OBJECTIVE IMPAIRMENTS: Abnormal gait, decreased balance, difficulty walking, decreased ROM, decreased strength, hypomobility, increased edema, impaired flexibility, and pain.   ACTIVITY LIMITATIONS: carrying, lifting, bending, sitting, standing, squatting, stairs, transfers, bed mobility, toileting, dressing, and locomotion level  PARTICIPATION LIMITATIONS: meal prep, cleaning, laundry, driving, shopping, community activity, and crafting/gardening  PERSONAL FACTORS: Age and involved orthopedic history are also affecting patient's functional outcome.   REHAB POTENTIAL: Good  CLINICAL DECISION MAKING: Evolving/moderate complexity  EVALUATION COMPLEXITY: Moderate   GOALS: Goals reviewed with patient? Yes  SHORT TERM GOALS: Target date: 05/03/2023  Pt will be independent with HEP to improve strength and decrease knee pain to improve pain-free function at home and  work. Baseline: 04/09/23: Baseline HEP initiated.   05/09/23: Compliant with HEP, pt proficient with exercises Goal status: ACHIEVED  Pt will improve knee flexion ROM to 0-120 deg as needed for improved ability to perform transferring from surfaces of various height, self-care ADLs e.g. dressing/hygiene Baseline: 04/09/23: AROM -3-81     05/09/23: L knee AROM -1-115 (passively -1-120) Goal status: IN PROGRESS/MOSTLY MET  LONG TERM GOALS: Target date: 06/07/2023  Pt will increase FOTO to at least 51 to demonstrate significant improvement in function at home and work related to knee pain  Baseline: 04/09/23: 21   05/09/23:  67/51 Goal status: ACHIEVED  2.  Pt will decrease worst knee pain by at least 3 points on the NPRS in order to demonstrate clinically significant reduction in knee pain. Baseline: 04/09/23: 9-10/10 at worst    05/09/23: 4/10 at worst Goal status: ACHIEVED  3.  Pt will ambulate with no AD for 660 ft or greater without significant gait deviation, LOB, or increase in knee pain > 1-2/10 as needed for community-level gait      Baseline: 04/09/23: Limited household distance tolerated, using FWW at this time.    05/09/23: Pt able to ambulate >660 feet safely with only mild dec terminal knee extension at terminal swing Goal status: IN PROGRESS/MOSTLY MET   4.  Pt will increase strength of tested LE musculature to 4+/5 MMT grade or greater in order to demonstrate improvement in strength and function  Baseline: 04/09/23: Hip flexor and knee strength 2-4/5, good seated hip ABD/ADD.    05/09/23: Not met for quad strength and hip flexor strength;  Goal status: NOT MET    PLAN: PT FREQUENCY: 1-2x/week  PT DURATION: 3-4 weeks  PLANNED INTERVENTIONS: Therapeutic exercises, Therapeutic activity,  Neuromuscular re-education, Balance training, Gait training, Patient/Family education, Self Care, Joint mobilization, Orthotic/Fit training, DME instructions, Electrical Stimulation,  Cryotherapy, Manual therapy, and Re-evaluation.  PLAN FOR NEXT SESSION: Continue with quad, hip flexor, and gluteal strengthening. Progress with CKC strengthening, stepping/weightbearing activities, re-training for stairs/obstacles and community-level mobility.    Consuela Mimes, PT, DPT #[id number]  Gertie Exon, PT 05/21/2023, 11:10 AM

## 2023-05-23 ENCOUNTER — Ambulatory Visit (INDEPENDENT_AMBULATORY_CARE_PROVIDER_SITE_OTHER): Payer: Medicare Other | Admitting: Physician Assistant

## 2023-05-23 ENCOUNTER — Encounter: Payer: Self-pay | Admitting: Physician Assistant

## 2023-05-23 VITALS — BP 118/70 | HR 77 | Temp 98.0°F | Ht 68.0 in | Wt 183.0 lb

## 2023-05-23 DIAGNOSIS — Z0000 Encounter for general adult medical examination without abnormal findings: Secondary | ICD-10-CM

## 2023-05-23 DIAGNOSIS — Z1211 Encounter for screening for malignant neoplasm of colon: Secondary | ICD-10-CM | POA: Diagnosis not present

## 2023-05-23 DIAGNOSIS — L820 Inflamed seborrheic keratosis: Secondary | ICD-10-CM | POA: Diagnosis not present

## 2023-05-23 DIAGNOSIS — K589 Irritable bowel syndrome without diarrhea: Secondary | ICD-10-CM | POA: Insufficient documentation

## 2023-05-23 NOTE — Patient Instructions (Signed)
-  It was a pleasure to see you today! Please review your visit summary for helpful information -I would encourage you to follow your care via MyChart where you can access lab results, notes, messages, and more -If you feel that we did a nice job today, please complete your after-visit survey and leave [place] a Google review! Your CMA today was Mariann Barter and your provider was Alvester Morin, PA-C, DMSc -Please return for follow-up in about 1y for routine physical.

## 2023-05-23 NOTE — Progress Notes (Addendum)
Date:  05/23/2023   Name:  Colleen Price   DOB:  06-19-1955   MRN:  [id number]   Chief Complaint: Annual Exam  HPI Eboni presents today for routine physical. We discussed vaccines in June, but she generally refuses immunizations most of the time. Current recommendations suggest she is due for Tdap, Shingrix, Prevnar - She refuses these today but is considering Prevnar 20.  Strongly encourage Prevnar given her history of recurrent bronchitis and lung issues.  Last Physical: >10y ago Last Dental Exam: early 2024 Last Eye Exam: 2023, mild AMD Last CRC screen: >10y ago Last Mammo: May 2024 Last Pap: aged out Last DEXA: 01/02/23 osteopenia  History of breast cancer, monitored more closely by another provider with mammograms every May.   TKA (right) about 7 weeks ago, seems to be recovering fairly well.   Skin spot right shoulder present for 2-3 months, started as a dry spot.   Medication list has been reviewed and updated.  Current Meds  Medication Sig   acetaminophen (TYLENOL) 325 MG tablet Take by mouth.   MACROBID 100 MG capsule Take 100 mg by mouth 2 (two) times daily.   meloxicam (MOBIC) 15 MG tablet Take 15 mg by mouth daily.   Multiple Vitamin (MULTI-VITAMIN) tablet Take 1 tablet by mouth daily.   [DISCONTINUED] ibuprofen (ADVIL) 200 MG tablet Take by mouth.     Review of Systems  Patient Active Problem List   Diagnosis Date Noted   Irritable bowel syndrome 05/23/2023   Vaccine refused by patient 12/01/2022   Generalized osteoarthritis of multiple sites 11/03/2022   History of breast cancer in female 11/03/2022   Osteopenia of neck of femur 11/03/2022   History of anemia 11/03/2022   Multilevel degenerative disc disease 10/17/2021   Osteoarthritis of knee 10/17/2021   Primary localized osteoarthritis of pelvic region and thigh 10/17/2021    Allergies  Allergen Reactions   Tape Rash and Dermatitis   Wound Dressing Adhesive Dermatitis   Conjugated Estrogens  Nausea And Vomiting, Rash and Other (See Comments)    Lupus like symptoms  Lupus like symptoms  Lupus like symptoms   Latex Rash   Silicone Rash     There is no immunization history on file for this patient.  Past Surgical History:  Procedure Laterality Date   ABDOMINAL HYSTERECTOMY  06/12/1988   BREAST LUMPECTOMY WITH SENTINEL LYMPH NODE BIOPSY Right 10/14/2015   Procedure: BREAST LUMPECTOMY WITH SENTINEL LYMPH NODE BX;  Surgeon: Kieth Brightly, MD;  Location: [facility];  Service: General;  Laterality: Right;   BREAST SURGERY     CATARACT EXTRACTION W/PHACO Right 01/23/2018   Procedure: CATARACT EXTRACTION PHACO AND INTRAOCULAR LENS PLACEMENT (IOC)  RIGHT;  Surgeon: Lockie Mola, MD;  Location: [facility];  Service: Ophthalmology;  Laterality: Right;   CATARACT EXTRACTION W/PHACO Left 02/13/2018   Procedure: CATARACT EXTRACTION PHACO AND INTRAOCULAR LENS PLACEMENT (IOC) LEFT;  Surgeon: Lockie Mola, MD;  Location: [facility];  Service: Ophthalmology;  Laterality: Left;   COLONOSCOPY     EYE SURGERY     JOINT REPLACEMENT     KNEE ARTHROSCOPY     TUBAL LIGATION      Social History   Tobacco Use   Smoking status: Never   Smokeless tobacco: Never  Vaping Use   Vaping status: Never Used  Substance Use Topics   Alcohol use: No   Drug use: No    Family History  Problem Relation Age of Onset  Transient ischemic attack Mother    Heart disease Father    Diabetes Father    Bladder Cancer Father    Breast cancer Sister         05/23/2023    8:08 AM 03/06/2023   10:44 AM 12/01/2022   11:07 AM 11/03/2022    2:23 PM  GAD 7 : Generalized Anxiety Score  Nervous, Anxious, on Edge 0 2 0 1  Control/stop worrying 0 1 1 0  Worry too much - different things 0 1 1 0  Trouble relaxing 0 1 1 2   Restless 0 1 0 1  Easily annoyed or irritable 0 0 0 0  Afraid - awful might happen 0 0 0 0  Total GAD 7 Score 0 6 3 4   Anxiety Difficulty Not  difficult at all Not difficult at all Not difficult at all Not difficult at all       05/23/2023    8:07 AM 03/06/2023   10:44 AM 12/01/2022   11:06 AM  Depression screen PHQ 2/9  Decreased Interest 1 2 0  Down, Depressed, Hopeless 1 2 1   PHQ - 2 Score 2 4 1   Altered sleeping 3 1 0  Tired, decreased energy 3 2 2   Change in appetite 0 0 0  Feeling bad or failure about yourself  0 0 0  Trouble concentrating 0 0 1  Moving slowly or fidgety/restless 3 2 0  Suicidal thoughts 0 0 0  PHQ-9 Score 11 9 4   Difficult doing work/chores Somewhat difficult Not difficult at all Not difficult at all    BP Readings from Last 3 Encounters:  05/23/23 118/70  03/06/23 122/78  12/01/22 108/74    Wt Readings from Last 3 Encounters:  05/23/23 183 lb (83 kg)  03/06/23 185 lb (83.9 kg)  12/01/22 176 lb (79.8 kg)    BP 118/70   Pulse 77   Temp 98 F (36.7 C) (Oral)   Ht 5\' 8"  (1.727 m)   Wt 183 lb (83 kg)   SpO2 99%   BMI 27.83 kg/m   Physical Exam Vitals and nursing note reviewed. Exam conducted with a chaperone present.  Constitutional:      Appearance: Normal appearance. She is well-groomed.  HENT:     Ears:     Comments: EAC clear bilaterally with good view of TM which is without effusion or erythema. Bilateral TM appear dry    Nose: Nose normal.     Mouth/Throat:     Mouth: Mucous membranes are moist. No oral lesions.     Dentition: Normal dentition.     Pharynx: Uvula midline. No posterior oropharyngeal erythema.  Eyes:     General: Vision grossly intact.     Extraocular Movements: Extraocular movements intact.     Conjunctiva/sclera: Conjunctivae normal.     Pupils: Pupils are equal, round, and reactive to light.  Neck:     Thyroid: No thyroid mass or thyromegaly.  Cardiovascular:     Rate and Rhythm: Normal rate and regular rhythm.     Heart sounds: S1 normal and S2 normal. No murmur heard.    No friction rub. No gallop.     Comments: Pulses 2+ at radial, PT, DP  bilaterally. No carotid bruit. No peripheral edema Pulmonary:     Effort: Pulmonary effort is normal.     Breath sounds: Normal breath sounds.  Chest:     Comments: Deferred Abdominal:     General: Bowel sounds are normal.  Palpations: Abdomen is soft. There is no mass.     Tenderness: There is no abdominal tenderness.  Genitourinary:    Comments: Deferred Musculoskeletal:     Comments: Full ROM with strength 5/5 bilateral upper and lower extremities  Lymphadenopathy:     Cervical: No cervical adenopathy.  Skin:    General: Skin is warm.     Capillary Refill: Capillary refill takes less than 2 seconds.     Findings: Lesion present. No rash.     Comments: 6 mm scaly, slightly red papule of the right shoulder favoring inflamed SK along the bra line, less likely AK.  Long vertical surgical incision scar overlying right knee, seems to be healing appropriately.   Neurological:     Mental Status: She is alert and oriented to person, place, and time.     Cranial Nerves: Cranial nerves 2-12 are intact.     Gait: Gait is intact.  Psychiatric:        Mood and Affect: Mood and affect normal.        Behavior: Behavior normal.     Recent Labs     Component Value Date/Time   NA 142 02/23/2022 0000   K 4.1 02/23/2022 0000   CL 107 02/23/2022 0000   CO2 29 (A) 02/23/2022 0000   GLUCOSE 96 10/26/2018 1300   BUN 21 02/23/2022 0000   CREATININE 0.6 02/23/2022 0000   CREATININE 0.73 10/26/2018 1300   CALCIUM 9.4 02/23/2022 0000   PROT 7.2 10/26/2018 1300   PROT 7.0 09/27/2015 1451   ALBUMIN 4.1 10/26/2018 1300   ALBUMIN 4.4 09/27/2015 1451   AST 20 10/26/2018 1300   ALT 20 10/26/2018 1300   ALKPHOS 69 10/26/2018 1300   BILITOT 0.8 10/26/2018 1300   BILITOT 0.5 09/27/2015 1451   GFRNONAA >60 10/26/2018 1300   GFRAA >60 10/26/2018 1300    Lab Results  Component Value Date   WBC 8.9 11/03/2022   HGB 12.9 11/03/2022   HCT 38.4 11/03/2022   MCV 86 11/03/2022   PLT 288  11/03/2022   No results found for: "HGBA1C" Lab Results  Component Value Date   CHOL 191 02/23/2022   HDL 55 02/23/2022   LDLCALC 119 02/23/2022   TRIG 84 02/23/2022   Lab Results  Component Value Date   TSH 1.253 10/26/2018     Assessment and Plan:  1. Annual physical exam Seemingly healthy patient with no abnormalities on exam. Encouraged healthy lifestyle including regular physical activity and consumption of whole fruits and vegetables. Encouraged routine dental and eye exams.  Patient will consider Prevnar 20 for the near future, but declines other vaccinations today.  2. Screening for colon cancer Declines colonoscopy, but willing to proceed with Cologuard.  Ordered today. - Cologuard  3. Inflamed seborrheic keratosis Patient reassured that I believe this to be SK irritated by bra line.  Advised covering the lesion with a Band-Aid, duct tape, or similar cover to avoid direct contact with bra line and observe over the next 1 to 2 weeks.  Alternatively, she may elect to use strapless bra or refrain from bra use entirely during this time. She is to let me know if not improving or if she would like further evaluation by dermatology.    Return in about 1 year (around 05/22/2024) for nonfasting CPE.  Return for nurse visit for Prevnar 20 if desired once recovered from knee surgery  Today's visit billed for provider time of 42 minutes including chart review, physical exam,  vaccine discussion, etc.   Alvester Morin, PA-C, DMSc, Nutritionist [facility] [phone]

## 2023-05-24 ENCOUNTER — Encounter: Payer: Self-pay | Admitting: Physical Therapy

## 2023-05-24 ENCOUNTER — Ambulatory Visit: Payer: Medicare Other | Admitting: Physical Therapy

## 2023-05-24 DIAGNOSIS — M6281 Muscle weakness (generalized): Secondary | ICD-10-CM

## 2023-05-24 DIAGNOSIS — R262 Difficulty in walking, not elsewhere classified: Secondary | ICD-10-CM

## 2023-05-24 DIAGNOSIS — M25561 Pain in right knee: Secondary | ICD-10-CM | POA: Diagnosis not present

## 2023-05-24 DIAGNOSIS — M25661 Stiffness of right knee, not elsewhere classified: Secondary | ICD-10-CM

## 2023-05-24 NOTE — Therapy (Signed)
OUTPATIENT PHYSICAL THERAPY TREATMENT    Patient Name: Colleen Price MRN: [id number] DOB:[birth year], 67 y.o., female Today's Date: 05/24/2023  END OF SESSION:  PT End of Session - 05/24/23 0943     Visit Number 14    Number of Visits 17    Date for PT Re-Evaluation 06/04/23    Authorization Type Medicare A&B; [employer] supplemental    Progress Note Due on Visit 10    PT Start Time 0950    PT Stop Time 1029    PT Time Calculation (min) 39 min    Activity Tolerance Patient tolerated treatment well;No increased pain    Behavior During Therapy WFL for tasks assessed/performed                  Past Medical History:  Diagnosis Date   Arthritis    KNEES   Cancer (HCC)    Complication of anesthesia    PONV (postoperative nausea and vomiting)    PT STATES SCOPALAMINE PATCH HELPED WITH HER LAST SURGERY   Past Surgical History:  Procedure Laterality Date   ABDOMINAL HYSTERECTOMY  06/12/1988   BREAST LUMPECTOMY WITH SENTINEL LYMPH NODE BIOPSY Right 10/14/2015   Procedure: BREAST LUMPECTOMY WITH SENTINEL LYMPH NODE BX;  Surgeon: Kieth Brightly, MD;  Location: [facility];  Service: General;  Laterality: Right;   BREAST SURGERY     CATARACT EXTRACTION W/PHACO Right 01/23/2018   Procedure: CATARACT EXTRACTION PHACO AND INTRAOCULAR LENS PLACEMENT (IOC)  RIGHT;  Surgeon: Lockie Mola, MD;  Location: [facility];  Service: Ophthalmology;  Laterality: Right;   CATARACT EXTRACTION W/PHACO Left 02/13/2018   Procedure: CATARACT EXTRACTION PHACO AND INTRAOCULAR LENS PLACEMENT (IOC) LEFT;  Surgeon: Lockie Mola, MD;  Location: [facility];  Service: Ophthalmology;  Laterality: Left;   COLONOSCOPY     EYE SURGERY     JOINT REPLACEMENT     KNEE ARTHROSCOPY     TUBAL LIGATION     Patient Active Problem List   Diagnosis Date Noted   Irritable bowel syndrome 05/23/2023   Vaccine refused by patient 12/01/2022   Generalized osteoarthritis of multiple  sites 11/03/2022   History of breast cancer in female 11/03/2022   Osteopenia of neck of femur 11/03/2022   History of anemia 11/03/2022   Multilevel degenerative disc disease 10/17/2021   Osteoarthritis of knee 10/17/2021   Primary localized osteoarthritis of pelvic region and thigh 10/17/2021    PCP: Remo Lipps, PA  REFERRING PROVIDER: Sheral Apley, MD  REFERRING DIAG: [phone] (ICD-10-CM) - S/P TKR (total knee replacement), righ   RATIONALE FOR EVALUATION AND TREATMENT: Rehabilitation  THERAPY DIAG: Acute pain of right knee  Stiffness of right knee, not elsewhere classified  Difficulty in walking, not elsewhere classified  Muscle weakness (generalized)  ONSET DATE: R TKA, DOS 04/05/23  FOLLOW-UP APPT SCHEDULED WITH REFERRING PROVIDER: Yes ; 04/19/23  PERTINENT HISTORY: Pt is a 67 year old female s/p R TKA 04/05/23. Patient reports no significant post-op complications. She and her husband report being off of pain medicine regimen last evening and this AM - more pain this AM due to this. Pt is using ice machine at home often. Patient is using FWW for mobility at the time. Pt reports difficulty with moving LE to/from bed. Patient reports she is doing better with getting onto commode at this time.   PAIN:   Pain Intensity: Present: 7/10, Worst: 9-10/10 Pain location: R knee Pain quality: sharp and burning   Swelling: Yes ; expected  post-op swelling Numbness/Tingling: Yes; some anterior knee/peri-incisional sensory change Focal weakness or buckling: Yes; weakness in antigravity mm for RLE Aggravating factors: attempting to lift surgical LE on/off bed, car transfers Relieving factors: pain medicine, Tylenol, ice History of prior back, hip, or knee injury, pain, surgery, or therapy: Yes; Hx of meniscus tear with arthroscopy/partial meniscectomy   Imaging: No  Prior level of function: Independent with community mobility without device Occupational demands:  Retired Presenter, broadcasting: Clinical cytogeneticist, gardening/flower bed, active lifestyle   Red flags: Negative for personal history of cancer, chills/fever, night sweats, nausea, vomiting, unexplained weight gain/loss, unrelenting pain  PRECAUTIONS: None  WEIGHT BEARING RESTRICTIONS: Yes WBAT  FALLS: Has patient fallen in last 6 months? No  Living Environment Lives with: lives with their spouse; her sister lives locally and is able to help Lives in: House/apartment 2 steps to get into home - step down into family room (2 steps) - no handrail for home entrance; home is one level. [place] to walk into home; concrete sidewalk to get to/from car.   Patient Goals: Able to get back to normal community-level mobility, dec R knee pain   OBJECTIVE:    Gross Musculoskeletal Assessment Tremor: None Bulk: R quadriceps atrophy Tone: Normal  GAIT: Distance walked: 30 ft Assistive device utilized: Walker - 2 wheeled Level of assistance: CGA Comments: Decreased R knee terminal extension at terminal swing   AROM AROM (Normal range in degrees) AROM 04/09/23 AROM 05/09/23  Hip Right Left Right Left  Flexion (125)      Extension (15)      Abduction (40)      Adduction       Internal Rotation (45)      External Rotation (45)            Knee      Flexion (135) 81 130 115 130  Extension (0) -3 +3 -1 +3        Ankle      Dorsiflexion (20) WNL WNL WNL WNL  Plantarflexion (50)      Inversion (35)      Eversion (15      (* = pain; Blank rows = not tested)   05/09/23: PROM R knee:  -1-123    LE MMT: MMT (out of 5) Right 04/09/23 Left 04/09/23 Right 05/09/23 Left 05/09/23  Hip flexion 2 4 4- 4  Hip extension      Hip abduction (seated) 5 5 5 5   Hip adduction (seated) 5 5 5 5   Hip internal rotation      Hip external rotation      Knee flexion 4 5 4+ 5  Knee extension 2 4 4* 4+  Ankle dorsiflexion      Ankle plantarflexion      Ankle inversion      Ankle eversion      (* = pain; Blank rows =  not tested)  Sensation Only altered sensation peri-incisional region  Reflexes Deferred  Muscle Length Deferred  Palpation Location LEFT  RIGHT           Quadriceps    Medial Hamstrings    Lateral Hamstrings    Lateral Hamstring tendon    Medial Hamstring tendon    Quadriceps tendon  0  Patella  1  Patellar Tendon  1  Tibial Tuberosity  1  Medial joint line  2  Lateral joint line  2  MCL    LCL    Adductor Tubercle    Pes Anserine tendon  Infrapatellar fat pad    Fibular head    Popliteal fossa    (Blank rows = not tested) Graded on 0-4 scale (0 = no pain, 1 = pain, 2 = pain with wincing/grimacing/flinching, 3 = pain with withdrawal, 4 = unwilling to allow palpation), (Blank rows = not tested)     TODAY'S TREATMENT:   SUBJECTIVE STATEMENT:   Pt reports 1.5-2/10 NPRS. Patient reports some low-level pain felt with ADLs through the day. She reports no major limitation in regard to daily activities.     Manual Therapy - for symptom modulation, soft tissue sensitivity and mobility, joint mobility, ROM   Patellar mobilization 2 x 30 sec bouts, emphasis on superior and inferior glides R knee PROM as tolerated; x 5 minutes into flexion and extension in supine with gentle overpressure into flexion as tolerated Tibiofemoral mobilization; 2 x 30 second bouts each; gr II for pain control   R knee AROM 0-118 R knee PROM 0-123   *not today* STM and gentle IASTM with Theraband roller, R medial calf; x  5 minutes with pt in hooklying   Therapeutic Exercise - for improved soft tissue flexibility and extensibility as needed for ROM, improved strength as needed to improve performance of CKC  activities/functional movements   NuStep; Level 3, x 5 minutes - for improved soft tissue mobility and increased tissue temperature to improve muscle performance   -subjective gathered during this time   SLR; 2x10; with 4-lb ankle weight   On blue agility ladder; Dynamic march;  5x D/B length of bars; 5-lb ankle weights   Forward step-up with opposite LE follow-through; 6-inch step; 2x10  Air squat with Goblet hold; 6-lb Medball; 2x10  Obstacle course in // bars: (3) 6-inch hurdles, 2 Airex pads; 5x D/B course   PATIENT EDUCATION: Discussed current POC and likely working toward completion of POC following next week's visits.     *not today* Long arc quad, edge of treatment table; 2x12, 3 sec hold   -with 7.5-lb AW Cold pack (unbilled) - for anti-inflammatory and analgesic effect as needed for reduced pain and improved ability to participate in active PT intervention, along R knee in supine with pillow under calf, x 5 minutes In // bars: Standing heel raise; 2x10 Forward lunge; 2x10 Short arc quad with green bolster; 2x8, 3 sec hold   -with 5-lb AW Heel slide with bedsheet; 1x10, 5 sec hold Dynamic march, forward stepping only; 5x D/B Standing march, in walker; 2x10 alternating R/L Supine quad set; 1 x 10, 5 sec hold  SLR; 2 x 4  Ball squeeze hip adductor isometric; 2 x 10 5 sec Supine hip abduction, Blue Tband; 2x10      PATIENT EDUCATION:  Education details: see above for patient education details Person educated: Patient Education method: Explanation, Demonstration, and Handouts Education comprehension: verbalized understanding and returned demonstration   HOME EXERCISE PROGRAM:  Access Code: [email or web address] URL: [email or web address] Date: 04/18/2023 Prepared by: Consuela Mimes  Exercises - Supine Quad Set  - 3 x daily - 7 x weekly - 2 sets - 10 reps - 5sec hold - Supine Heel Slide with Strap  - 2 x daily - 7 x weekly - 2 sets - 10 reps - 5sec hold - Seated Long Arc Quad  - 2 x daily - 7 x weekly - 2 sets - 10 reps - Supine Ankle Pumps  - 3 x daily - 7 x weekly - 2 sets - 10 reps   ASSESSMENT:  CLINICAL  IMPRESSION: Pt has good R knee ROM and is progressing well with emphasis on strengthening and recovery of function with  late-phase rehab. Pt fortunately does not have significant functional activity limitations at this time and is nearing readiness for discharge. Pt has remaining impairments in R knee stiffness, patellar mobility, quad and hip flexor strength; pt also needs work on fully rehabbing functional activity performance e.g. stepping up/down, transferring, and obstacle negotiation. Pt will continue to benefit from skilled PT services to address deficits and improve function.   OBJECTIVE IMPAIRMENTS: Abnormal gait, decreased balance, difficulty walking, decreased ROM, decreased strength, hypomobility, increased edema, impaired flexibility, and pain.   ACTIVITY LIMITATIONS: carrying, lifting, bending, sitting, standing, squatting, stairs, transfers, bed mobility, toileting, dressing, and locomotion level  PARTICIPATION LIMITATIONS: meal prep, cleaning, laundry, driving, shopping, community activity, and crafting/gardening  PERSONAL FACTORS: Age and involved orthopedic history are also affecting patient's functional outcome.   REHAB POTENTIAL: Good  CLINICAL DECISION MAKING: Evolving/moderate complexity  EVALUATION COMPLEXITY: Moderate   GOALS: Goals reviewed with patient? Yes  SHORT TERM GOALS: Target date: 05/03/2023  Pt will be independent with HEP to improve strength and decrease knee pain to improve pain-free function at home and work. Baseline: 04/09/23: Baseline HEP initiated.   05/09/23: Compliant with HEP, pt proficient with exercises Goal status: ACHIEVED  Pt will improve knee flexion ROM to 0-120 deg as needed for improved ability to perform transferring from surfaces of various height, self-care ADLs e.g. dressing/hygiene Baseline: 04/09/23: AROM -3-81     05/09/23: L knee AROM -1-115 (passively -1-120) Goal status: IN PROGRESS/MOSTLY MET  LONG TERM GOALS: Target date: 06/07/2023  Pt will increase FOTO to at least 51 to demonstrate significant improvement in function at home and work  related to knee pain  Baseline: 04/09/23: 21   05/09/23:  67/51 Goal status: ACHIEVED  2.  Pt will decrease worst knee pain by at least 3 points on the NPRS in order to demonstrate clinically significant reduction in knee pain. Baseline: 04/09/23: 9-10/10 at worst    05/09/23: 4/10 at worst Goal status: ACHIEVED  3.  Pt will ambulate with no AD for 660 ft or greater without significant gait deviation, LOB, or increase in knee pain > 1-2/10 as needed for community-level gait      Baseline: 04/09/23: Limited household distance tolerated, using FWW at this time.    05/09/23: Pt able to ambulate >660 feet safely with only mild dec terminal knee extension at terminal swing Goal status: IN PROGRESS/MOSTLY MET   4.  Pt will increase strength of tested LE musculature to 4+/5 MMT grade or greater in order to demonstrate improvement in strength and function  Baseline: 04/09/23: Hip flexor and knee strength 2-4/5, good seated hip ABD/ADD.    05/09/23: Not met for quad strength and hip flexor strength;  Goal status: NOT MET    PLAN: PT FREQUENCY: 1-2x/week  PT DURATION: 3-4 weeks  PLANNED INTERVENTIONS: Therapeutic exercises, Therapeutic activity, Neuromuscular re-education, Balance training, Gait training, Patient/Family education, Self Care, Joint mobilization, Orthotic/Fit training, DME instructions, Electrical Stimulation, Cryotherapy, Manual therapy, and Re-evaluation.  PLAN FOR NEXT SESSION: Continue with quad, hip flexor, and gluteal strengthening. Progress with CKC strengthening, stepping/weightbearing activities, re-training for stairs/obstacles and community-level mobility.    Consuela Mimes, PT, DPT #[id number]  Gertie Exon, PT 05/24/2023, 9:59 AM

## 2023-05-24 NOTE — Addendum Note (Signed)
Addended by: Remo Lipps on: 05/24/2023 02:43 PM   Modules accepted: Level of Service

## 2023-05-28 ENCOUNTER — Ambulatory Visit: Payer: Medicare Other | Admitting: Physical Therapy

## 2023-05-28 ENCOUNTER — Encounter: Payer: Self-pay | Admitting: Physical Therapy

## 2023-05-28 DIAGNOSIS — M6281 Muscle weakness (generalized): Secondary | ICD-10-CM

## 2023-05-28 DIAGNOSIS — M25661 Stiffness of right knee, not elsewhere classified: Secondary | ICD-10-CM

## 2023-05-28 DIAGNOSIS — M25561 Pain in right knee: Secondary | ICD-10-CM

## 2023-05-28 DIAGNOSIS — R262 Difficulty in walking, not elsewhere classified: Secondary | ICD-10-CM

## 2023-05-28 NOTE — Therapy (Signed)
OUTPATIENT PHYSICAL THERAPY TREATMENT/GOAL UPDATE AND DISCHARGE   Patient Name: Colleen Price MRN: [id number] DOB:12/06/1955, 67 y.o., female Today's Date: 05/28/2023  END OF SESSION:  PT End of Session - 05/28/23 1036     Visit Number 15    Number of Visits 17    Date for PT Re-Evaluation 06/04/23    Authorization Type Medicare A&B; A[employer] supplemental    Progress Note Due on Visit 10    PT Start Time 1033    PT Stop Time 1113    PT Time Calculation (min) 40 min    Activity Tolerance Patient tolerated treatment well;No increased pain    Behavior During Therapy WFL for tasks assessed/performed              Past Medical History:  Diagnosis Date   Arthritis    KNEES   Cancer (HCC)    Complication of anesthesia    PONV (postoperative nausea and vomiting)    PT STATES SCOPALAMINE PATCH HELPED WITH HER LAST SURGERY   Past Surgical History:  Procedure Laterality Date   ABDOMINAL HYSTERECTOMY  06/12/1988   BREAST LUMPECTOMY WITH SENTINEL LYMPH NODE BIOPSY Right 10/14/2015   Procedure: BREAST LUMPECTOMY WITH SENTINEL LYMPH NODE BX;  Surgeon: Kieth Brightly, MD;  Location: [facility];  Service: General;  Laterality: Right;   BREAST SURGERY     CATARACT EXTRACTION W/PHACO Right 01/23/2018   Procedure: CATARACT EXTRACTION PHACO AND INTRAOCULAR LENS PLACEMENT (IOC)  RIGHT;  Surgeon: Lockie Mola, MD;  Location: [facility];  Service: Ophthalmology;  Laterality: Right;   CATARACT EXTRACTION W/PHACO Left 02/13/2018   Procedure: CATARACT EXTRACTION PHACO AND INTRAOCULAR LENS PLACEMENT (IOC) LEFT;  Surgeon: Lockie Mola, MD;  Location: [facility];  Service: Ophthalmology;  Laterality: Left;   COLONOSCOPY     EYE SURGERY     JOINT REPLACEMENT     KNEE ARTHROSCOPY     TUBAL LIGATION     Patient Active Problem List   Diagnosis Date Noted   Irritable bowel syndrome 05/23/2023   Vaccine refused by patient 12/01/2022   Generalized  osteoarthritis of multiple sites 11/03/2022   History of breast cancer in female 11/03/2022   Osteopenia of neck of femur 11/03/2022   History of anemia 11/03/2022   Multilevel degenerative disc disease 10/17/2021   Osteoarthritis of knee 10/17/2021   Primary localized osteoarthritis of pelvic region and thigh 10/17/2021    PCP: Remo Lipps, PA  REFERRING PROVIDER: Sheral Apley, MD  REFERRING DIAG: [phone] (ICD-10-CM) - S/P TKR (total knee replacement), righ   RATIONALE FOR EVALUATION AND TREATMENT: Rehabilitation  THERAPY DIAG: Stiffness of right knee, not elsewhere classified  Acute pain of right knee  Difficulty in walking, not elsewhere classified  Muscle weakness (generalized)  ONSET DATE: R TKA, DOS 04/05/23  FOLLOW-UP APPT SCHEDULED WITH REFERRING PROVIDER: Yes ; 04/19/23  PERTINENT HISTORY: Pt is a 67 year old female s/p R TKA 04/05/23. Patient reports no significant post-op complications. She and her husband report being off of pain medicine regimen last evening and this AM - more pain this AM due to this. Pt is using ice machine at home often. Patient is using FWW for mobility at the time. Pt reports difficulty with moving LE to/from bed. Patient reports she is doing better with getting onto commode at this time.   PAIN:   Pain Intensity: Present: 7/10, Worst: 9-10/10 Pain location: R knee Pain quality: sharp and burning   Swelling: Yes ; expected post-op swelling  Numbness/Tingling: Yes; some anterior knee/peri-incisional sensory change Focal weakness or buckling: Yes; weakness in antigravity mm for RLE Aggravating factors: attempting to lift surgical LE on/off bed, car transfers Relieving factors: pain medicine, Tylenol, ice History of prior back, hip, or knee injury, pain, surgery, or therapy: Yes; Hx of meniscus tear with arthroscopy/partial meniscectomy   Imaging: No  Prior level of function: Independent with community mobility without  device Occupational demands: Retired Presenter, broadcasting: Clinical cytogeneticist, gardening/flower bed, active lifestyle   Red flags: Negative for personal history of cancer, chills/fever, night sweats, nausea, vomiting, unexplained weight gain/loss, unrelenting pain  PRECAUTIONS: None  WEIGHT BEARING RESTRICTIONS: Yes WBAT  FALLS: Has patient fallen in last 6 months? No  Living Environment Lives with: lives with their spouse; her sister lives locally and is able to help Lives in: House/apartment 2 steps to get into home - step down into family room (2 steps) - no handrail for home entrance; home is one level. Gravel to walk into home; concrete sidewalk to get to/from car.   Patient Goals: Able to get back to normal community-level mobility, dec R knee pain     OBJECTIVE (data from initial evaluation unless otherwise dated):     Gross Musculoskeletal Assessment Tremor: None Bulk: R quadriceps atrophy Tone: Normal  GAIT: Distance walked: 30 ft Assistive device utilized: Walker - 2 wheeled Level of assistance: CGA Comments: Decreased R knee terminal extension at terminal swing   AROM AROM (Normal range in degrees) AROM 04/09/23 AROM 05/09/23 AROM 05/28/23  Hip Right Left Right Left Right Left  Flexion (125)        Extension (15)        Abduction (40)        Adduction         Internal Rotation (45)        External Rotation (45)                Knee        Flexion (135) 81 130 115 130 119 130  Extension (0) -3 +3 -1 +3 0 +3          Ankle        Dorsiflexion (20) WNL WNL WNL WNL    Plantarflexion (50)        Inversion (35)        Eversion (15        (* = pain; Blank rows = not tested)   05/09/23: PROM R knee:  -1-123 05/28/23: PROM R knee:  -1-125    LE MMT: MMT (out of 5) Right 04/09/23 Left 04/09/23 Right 05/09/23 Left 05/09/23 Right 05/28/23 Left 05/28/23  Hip flexion 2 4 4- 4 4+ 4  Hip extension        Hip abduction (seated) 5 5 5 5 5 5   Hip adduction (seated) 5 5 5  5 5 5   Hip internal rotation        Hip external rotation        Knee flexion 4 5 4+ 5 4+ 4+  Knee extension 2 4 4* 4+ 4* 5-  Ankle dorsiflexion        Ankle plantarflexion        Ankle inversion        Ankle eversion        (* = pain; Blank rows = not tested)  Sensation Only altered sensation peri-incisional region  Reflexes Deferred  Muscle Length Deferred  Palpation Location LEFT  RIGHT  Quadriceps    Medial Hamstrings    Lateral Hamstrings    Lateral Hamstring tendon    Medial Hamstring tendon    Quadriceps tendon  0  Patella  1  Patellar Tendon  1  Tibial Tuberosity  1  Medial joint line  2  Lateral joint line  2  MCL    LCL    Adductor Tubercle    Pes Anserine tendon    Infrapatellar fat pad    Fibular head    Popliteal fossa    (Blank rows = not tested) Graded on 0-4 scale (0 = no pain, 1 = pain, 2 = pain with wincing/grimacing/flinching, 3 = pain with withdrawal, 4 = unwilling to allow palpation), (Blank rows = not tested)     TODAY'S TREATMENT:   SUBJECTIVE STATEMENT:   Pt reports she may have "overdone it" this past weekend with prolonged standing and walking during baking; she walked back and forth to shop on her property - her husband discouraged her from doing so much weightbearing activity. Patient reports pain is mild at the time. Patient reports notable soreness/pain first thing this morning - improved with Meloxicam/Tylenol.     Manual Therapy - for symptom modulation, soft tissue sensitivity and mobility, joint mobility, ROM   Patellar mobilization 2 x 30 sec bouts, emphasis on superior and inferior glides R knee PROM as tolerated; x 5 minutes into flexion and extension in supine with gentle overpressure into flexion as tolerated Tibiofemoral mobilization; 2 x 30 second bouts each; gr II for pain control   *not today* STM and gentle IASTM with Theraband roller, R medial calf; x  5 minutes with pt in hooklying   Therapeutic  Exercise - for improved soft tissue flexibility and extensibility as needed for ROM, improved strength as needed to improve performance of CKC  activities/functional movements   *GOAL UPDATE PERFORMED   NuStep; Level 3, x 5 minutes - for improved soft tissue mobility and increased tissue temperature to improve muscle performance   -subjective gathered during this time   SLR; reviewed for HEP  Forward step-up with opposite LE follow-through; 6-inch step; reviewed for HEP  Minisquat; 2x10  -reviewed for HEP  Forward hurdle step; 1x10 with each LE  -reviewed for HEP   PATIENT EDUCATION: Discussed current POC and goals met in PT. We reviewed advanced HEP and provided new MedBridge printout. We discussed following up with PT over next month if experiencing regression in her condition.     *not today* Obstacle course in // bars: (3) 6-inch hurdles, 2 Airex pads; 5x D/B course On blue agility ladder; Dynamic march; 5x D/B length of bars; 5-lb ankle weights  Long arc quad, edge of treatment table; 2x12, 3 sec hold   -with 7.5-lb AW Cold pack (unbilled) - for anti-inflammatory and analgesic effect as needed for reduced pain and improved ability to participate in active PT intervention, along R knee in supine with pillow under calf, x 5 minutes In // bars: Standing heel raise; 2x10 Forward lunge; 2x10 Short arc quad with green bolster; 2x8, 3 sec hold   -with 5-lb AW Heel slide with bedsheet; 1x10, 5 sec hold Dynamic march, forward stepping only; 5x D/B Standing march, in walker; 2x10 alternating R/L Supine quad set; 1 x 10, 5 sec hold  SLR; 2 x 4  Ball squeeze hip adductor isometric; 2 x 10 5 sec Supine hip abduction, Blue Tband; 2x10      PATIENT EDUCATION:  Education details: see above  for patient education details Person educated: Patient Education method: Explanation, Demonstration, and Handouts Education comprehension: verbalized understanding and returned  demonstration   HOME EXERCISE PROGRAM:  Access Code: [email or web address] URL: [email or web address] Date: 05/28/2023 Prepared by: Consuela Mimes  Exercises - Active Straight Leg Raise with Quad Set  - 2 x daily - 7 x weekly - 2-3 sets - 10 reps - Runner's Step Up/Down  - 1 x daily - 4 x weekly - 2-3 sets - 10 reps - Sit to Stand  - 1 x daily - 4 x weekly - 2-3 sets - 10 reps - Standing Double Leg Mini Squat  - 1 x daily - 4 x weekly - 2 sets - 10 reps - Forward Step Over with Counter Support  - 1 x daily - 4 x weekly - 2 sets - 10 reps   ASSESSMENT:  CLINICAL IMPRESSION: Pt has largely met goals for PT and she has no major functional activity limitations with exception of some knee discomfort after high volume of standing/walking/weightbearing activity. She reports more L anterior knee pain following high volume of standing and walking between her home and another building on her property; this was alleviated with use of Meloxicam and Tylenol. Pain status has substantially improved since early-phase post-op rehab, but she does still have continuous low-level pain and intermittent moderate pain with significant increase in ambulatory activity - this is expected to improve with continued post-op healing. Pt has normal knee ROM and mostly good strength - she has moderate remaining L quadriceps weakness. She is now community-level ambulator and is able to complete all functional mobility tasks and household tasks necessary. We updated her HEP for advanced strengthening today. Pt's last scheduled visit was for Wednesday, but she is having to cancel this due to schedule conflict. We elected to complete POC today given her current progress. Pt is comfortable with completing PT today, and she has made sufficient progress to continue with strengthening primarily at home. Pt may f/u with PT over next 3-4 weeks if she experiences any regression in condition or if she isn't progressing  sufficiently.   OBJECTIVE IMPAIRMENTS: Abnormal gait, decreased balance, difficulty walking, decreased ROM, decreased strength, hypomobility, increased edema, impaired flexibility, and pain.   ACTIVITY LIMITATIONS: carrying, lifting, bending, sitting, standing, squatting, stairs, transfers, bed mobility, toileting, dressing, and locomotion level  PARTICIPATION LIMITATIONS: meal prep, cleaning, laundry, driving, shopping, community activity, and crafting/gardening  PERSONAL FACTORS: Age and involved orthopedic history are also affecting patient's functional outcome.   REHAB POTENTIAL: Good  CLINICAL DECISION MAKING: Evolving/moderate complexity  EVALUATION COMPLEXITY: Moderate   GOALS: Goals reviewed with patient? Yes  SHORT TERM GOALS: Target date: 05/03/2023  Pt will be independent with HEP to improve strength and decrease knee pain to improve pain-free function at home and work. Baseline: 04/09/23: Baseline HEP initiated.   05/09/23: Compliant with HEP, pt proficient with exercises Goal status: ACHIEVED  Pt will improve knee flexion ROM to 0-120 deg as needed for improved ability to perform transferring from surfaces of various height, self-care ADLs e.g. dressing/hygiene Baseline: 04/09/23: AROM -3-81     05/09/23: L knee AROM -1-115 (passively -1-120).   05/28/23: 0-119 Goal status: MOSTLY MET   LONG TERM GOALS: Target date: 06/07/2023  Pt will increase FOTO to at least 51 to demonstrate significant improvement in function at home and work related to knee pain  Baseline: 04/09/23: 21   05/09/23:  67/51 Goal status: ACHIEVED  2.  Pt will decrease worst knee pain by  at least 3 points on the NPRS in order to demonstrate clinically significant reduction in knee pain. Baseline: 04/09/23: 9-10/10 at worst    05/09/23: 4/10 at worst Goal status: ACHIEVED  3.  Pt will ambulate with no AD for 660 ft or greater without significant gait deviation, LOB, or increase in knee pain >  1-2/10 as needed for community-level gait      Baseline: 04/09/23: Limited household distance tolerated, using FWW at this time.    05/09/23: Pt able to ambulate >660 feet safely with only mild dec terminal knee extension at terminal swing.   05/28/23: Pt able to ambulate at community-level distance with no AD and no notable deviations or LOB Goal status: ACHIEVED  4.  Pt will increase strength of tested LE musculature to 4+/5 MMT grade or greater in order to demonstrate improvement in strength and function  Baseline: 04/09/23: Hip flexor and knee strength 2-4/5, good seated hip ABD/ADD.    05/09/23: Not met for quad strength and hip flexor strength;    05/28/23: Met for all with exception of quad strength.  Goal status: IN PROGRESS/PARTLY MET    PLAN: PT FREQUENCY: -  PT DURATION: -  PLANNED INTERVENTIONS: Therapeutic exercises, Therapeutic activity, Neuromuscular re-education, Balance training, Gait training, Patient/Family education, Self Care, Joint mobilization, Orthotic/Fit training, DME instructions, Electrical Stimulation, Cryotherapy, Manual therapy, and Re-evaluation.  PLAN FOR NEXT SESSION: pt to continue with independent HEP. Pt may f/u with PT in next 3-4 weeks if she experiences regression in her condition.    Consuela Mimes, PT, DPT #[id number]  Gertie Exon, PT 05/28/2023, 11:13 AM

## 2023-05-30 ENCOUNTER — Ambulatory Visit: Payer: Medicare Other | Admitting: Physical Therapy

## 2023-06-02 LAB — COLOGUARD: COLOGUARD: NEGATIVE

## 2023-10-05 ENCOUNTER — Ambulatory Visit: Payer: Self-pay

## 2023-10-05 NOTE — Telephone Encounter (Signed)
  Chief Complaint: requesting cough medicine Symptoms: cough, congestion Frequency: intermittent, onset today Pertinent Negatives: Patient denies difficulty breathing, chest pain Disposition: [] ED /[] Urgent Care (no appt availability in office) / [] Appointment(In office/virtual)/ []  [facility]/ [x] Home Care/ [] Refused Recommended Disposition /[]  Mobile Bus/ []  Follow-up with PCP Additional Notes:  Requesting cough relief medication. She has a dry cough, chest feels heavy from coughing, feels congested. Denies shortness of breath and chest pain. Low grade fever t-max 99.0. She was outside a lot today and unsure if pollen is effecting her. Denies all other symptoms. Home care advised. Educated on care advice as documented in protocol, patient verbalized understanding. Discussed reasons to call back or seek evaluation at urgent care/ER.   Message from Dora S sent at 10/05/2023  4:49 PM EDT  Summary: cough, temperature   Copied From CRM [id number]. Reason for Triage: Temperature of 99 and cough. Patient is wanting medication called in to preferred pharmacy.  Callback # [id number]  Preferred pharmacy:  [facility] - [place], [place] - [address] [place] [address]ONGEX: [phone] Fax: [phone]: Not open 24 hours      Reason for Disposition  Cough  Protocols used: Cough - Acute Non-Productive-A-AH

## 2023-10-08 ENCOUNTER — Ambulatory Visit (INDEPENDENT_AMBULATORY_CARE_PROVIDER_SITE_OTHER): Admitting: Physician Assistant

## 2023-10-08 VITALS — BP 102/68 | HR 73 | Temp 98.2°F | Ht 68.0 in | Wt 176.0 lb

## 2023-10-08 DIAGNOSIS — J301 Allergic rhinitis due to pollen: Secondary | ICD-10-CM | POA: Diagnosis not present

## 2023-10-08 DIAGNOSIS — J069 Acute upper respiratory infection, unspecified: Secondary | ICD-10-CM | POA: Diagnosis not present

## 2023-10-08 DIAGNOSIS — M51369 Other intervertebral disc degeneration, lumbar region without mention of lumbar back pain or lower extremity pain: Secondary | ICD-10-CM | POA: Insufficient documentation

## 2023-10-08 NOTE — Telephone Encounter (Signed)
 Please review CRM.

## 2023-10-08 NOTE — Progress Notes (Signed)
 Date:  10/08/2023   Name:  Colleen Price   DOB:  1955/09/02   MRN:  [id number]   Chief Complaint: Cough (100.1 fever last night )  Cough This is a new problem. Episode onset: X2 weeks friday x2 days ago got worse. The problem has been gradually worsening (comes and goes). The problem occurs constantly. The cough is Productive of sputum (green mucous). Associated symptoms include chest pain, a fever, headaches, myalgias, nasal congestion, postnasal drip and rhinorrhea. She has tried OTC cough suppressant for the symptoms. The treatment provided mild relief.   Colleen Price presents today for 3 days URI symptoms as described above which were preceded by 2 weeks of dry cough.  She reports history of intermittent allergies, occasionally uses a intranasal spray but has not used recently.  Does not take any oral antihistamines.  Notable worsening of symptoms this past Friday including mild fever and bodyaches for which she has been taking Tylenol  Sinus and also using Delsym to suppress the cough with good effect.  No sick contacts.   Medication list has been reviewed and updated.  Current Meds  Medication Sig   acetaminophen  (TYLENOL ) 325 MG tablet Take by mouth.   meloxicam (MOBIC) 15 MG tablet Take 15 mg by mouth as needed.   Multiple Vitamin (MULTI-VITAMIN) tablet Take 1 tablet by mouth daily.   [DISCONTINUED] MACROBID 100 MG capsule Take 100 mg by mouth 2 (two) times daily.     Review of Systems  Constitutional:  Positive for fever.  HENT:  Positive for postnasal drip and rhinorrhea.   Respiratory:  Positive for cough.   Cardiovascular:  Positive for chest pain.  Musculoskeletal:  Positive for myalgias.  Neurological:  Positive for headaches.    Patient Active Problem List   Diagnosis Date Noted   Degeneration of lumbar intervertebral disc 10/08/2023   Irritable bowel syndrome 05/23/2023   Vaccine refused by patient 12/01/2022   Generalized osteoarthritis of multiple sites 11/03/2022    History of breast cancer in female 11/03/2022   Osteopenia of neck of femur 11/03/2022   History of anemia 11/03/2022   Multilevel degenerative disc disease 10/17/2021   Osteoarthritis of knee 10/17/2021   Primary localized osteoarthritis of pelvic region and thigh 10/17/2021    Allergies  Allergen Reactions   Tape Rash and Dermatitis   Wound Dressing Adhesive Dermatitis   Conjugated Estrogens Nausea And Vomiting, Rash and Other (See Comments)    Lupus like symptoms  Lupus like symptoms  Lupus like symptoms   Latex Rash   Silicone Rash     There is no immunization history on file for this patient.  Past Surgical History:  Procedure Laterality Date   ABDOMINAL HYSTERECTOMY  06/12/1988   BREAST LUMPECTOMY WITH SENTINEL LYMPH NODE BIOPSY Right 10/14/2015   Procedure: BREAST LUMPECTOMY WITH SENTINEL LYMPH NODE BX;  Surgeon: Colleen Mood, MD;  Location: [facility];  Service: General;  Laterality: Right;   BREAST SURGERY     CATARACT EXTRACTION W/PHACO Right 01/23/2018   Procedure: CATARACT EXTRACTION PHACO AND INTRAOCULAR LENS PLACEMENT (IOC)  RIGHT;  Surgeon: Colleen Kidney, MD;  Location: [facility];  Service: Ophthalmology;  Laterality: Right;   CATARACT EXTRACTION W/PHACO Left 02/13/2018   Procedure: CATARACT EXTRACTION PHACO AND INTRAOCULAR LENS PLACEMENT (IOC) LEFT;  Surgeon: Colleen Kidney, MD;  Location: [facility];  Service: Ophthalmology;  Laterality: Left;   COLONOSCOPY     EYE SURGERY     JOINT REPLACEMENT  KNEE ARTHROSCOPY     REPLACEMENT TOTAL KNEE Right    TUBAL LIGATION      Social History   Tobacco Use   Smoking status: Never   Smokeless tobacco: Never  Vaping Use   Vaping status: Never Used  Substance Use Topics   Alcohol use: No   Drug use: No    Family History  Problem Relation Age of Onset   Transient ischemic attack Mother    Heart disease Father    Diabetes Father    Bladder Cancer Father     Breast cancer Sister         10/08/2023    9:22 AM 05/23/2023    8:08 AM 03/06/2023   10:44 AM 12/01/2022   11:07 AM  GAD 7 : Generalized Anxiety Score  Nervous, Anxious, on Edge 0 0 2 0  Control/stop worrying 0 0 1 1  Worry too much - different things 0 0 1 1  Trouble relaxing 0 0 1 1  Restless 0 0 1 0  Easily annoyed or irritable 0 0 0 0  Afraid - awful might happen 0 0 0 0  Total GAD 7 Score 0 0 6 3  Anxiety Difficulty Not difficult at all Not difficult at all Not difficult at all Not difficult at all       10/08/2023    9:22 AM 05/23/2023    8:07 AM 03/06/2023   10:44 AM  Depression screen PHQ 2/9  Decreased Interest 0 1 2  Down, Depressed, Hopeless 0 1 2  PHQ - 2 Score 0 2 4  Altered sleeping  3 1  Tired, decreased energy  3 2  Change in appetite  0 0  Feeling bad or failure about yourself   0 0  Trouble concentrating  0 0  Moving slowly or fidgety/restless  3 2  Suicidal thoughts  0 0  PHQ-9 Score  11 9  Difficult doing work/chores  Somewhat difficult Not difficult at all    BP Readings from Last 3 Encounters:  10/08/23 102/68  05/23/23 118/70  03/06/23 122/78    Wt Readings from Last 3 Encounters:  10/08/23 176 lb (79.8 kg)  05/23/23 183 lb (83 kg)  03/06/23 185 lb (83.9 kg)    BP 102/68   Pulse 73   Temp 98.2 F (36.8 C)   Ht 5\' 8"  (1.727 m)   Wt 176 lb (79.8 kg)   SpO2 95%   BMI 26.76 kg/m   Physical Exam Vitals and nursing note reviewed.  Constitutional:      General: She is not in acute distress.    Appearance: Normal appearance.  HENT:     Right Ear: Tympanic membrane normal.     Left Ear: Tympanic membrane normal.     Ears:     Comments: EAC clear bilaterally with good view of TM which is without effusion or erythema.     Nose: Mucosal edema (erythema) present.     Right Sinus: No maxillary sinus tenderness or frontal sinus tenderness.     Left Sinus: No maxillary sinus tenderness or frontal sinus tenderness.     Mouth/Throat:      Mouth: Mucous membranes are moist.     Pharynx: No oropharyngeal exudate or posterior oropharyngeal erythema.  Eyes:     Pupils: Pupils are equal, round, and reactive to light.     Comments: Mild to moderate injection of bilateral palpebral conjunctiva.  Cardiovascular:     Rate and Rhythm: Normal  rate and regular rhythm.     Heart sounds: No murmur heard.    No friction rub. No gallop.  Pulmonary:     Effort: Pulmonary effort is normal.     Breath sounds: Normal breath sounds. No wheezing, rhonchi or rales.  Lymphadenopathy:     Cervical: No cervical adenopathy.     Recent Labs     Component Value Date/Time   NA 142 02/23/2022 0000   K 4.1 02/23/2022 0000   CL 107 02/23/2022 0000   CO2 29 (A) 02/23/2022 0000   GLUCOSE 96 10/26/2018 1300   BUN 21 02/23/2022 0000   CREATININE 0.6 02/23/2022 0000   CREATININE 0.73 10/26/2018 1300   CALCIUM 9.4 02/23/2022 0000   PROT 7.2 10/26/2018 1300   PROT 7.0 09/27/2015 1451   ALBUMIN 4.1 10/26/2018 1300   ALBUMIN 4.4 09/27/2015 1451   AST 20 10/26/2018 1300   ALT 20 10/26/2018 1300   ALKPHOS 69 10/26/2018 1300   BILITOT 0.8 10/26/2018 1300   BILITOT 0.5 09/27/2015 1451   GFRNONAA >60 10/26/2018 1300   GFRAA >60 10/26/2018 1300    Lab Results  Component Value Date   WBC 8.9 11/03/2022   HGB 12.9 11/03/2022   HCT 38.4 11/03/2022   MCV 86 11/03/2022   PLT 288 11/03/2022   No results found for: "HGBA1C" Lab Results  Component Value Date   CHOL 191 02/23/2022   HDL 55 02/23/2022   LDLCALC 119 02/23/2022   TRIG 84 02/23/2022   Lab Results  Component Value Date   TSH 1.253 10/26/2018     Assessment and Plan:  1. Acute URI (Primary) Likely viral etiology complicated by underlying allergies. Discussed self-limited nature of viral illnesses and advised conservative measures including rest, fluids, honey, and OTC cough/cold medications.   Contact precautions advised to limit spread. Encouraged mask wearing and good hand  hygiene especially before meals. Call if acutely worsening symptoms or if no improvement at all in the next 24-48h.    2. Seasonal allergic rhinitis due to pollen Encouraged patient to resume intranasal corticosteroid and also take an oral antihistamine of her choice.  Encouraged adequate hydration as well.   F/u PRN   Cody Das, PA-C, DMSc, Nutritionist [facility] [phone]

## 2023-10-08 NOTE — Telephone Encounter (Signed)
 Seen in clinic today.

## 2023-10-11 ENCOUNTER — Telehealth: Payer: Self-pay

## 2023-10-11 ENCOUNTER — Other Ambulatory Visit: Payer: Self-pay | Admitting: Physician Assistant

## 2023-10-11 MED ORDER — AZITHROMYCIN 250 MG PO TABS: ORAL_TABLET | ORAL | 0 refills | Status: AC

## 2023-10-11 NOTE — Progress Notes (Signed)
 Persistent illness

## 2023-10-11 NOTE — Telephone Encounter (Signed)
 Copied from CRM [id number]. Topic: General - Other >> Oct 11, 2023  9:23 AM [name] wrote: Reason for CRM: Patient reports that she had an appointment on Monday but no medication was prescribed and she was told if she is not better with the over the counter medication to call back to request a Rx. Patient requests that a Rx be sent to [facility] - [place], [place]

## 2023-11-08 ENCOUNTER — Ambulatory Visit: Admitting: Emergency Medicine

## 2023-11-08 VITALS — Ht 68.0 in | Wt 180.0 lb

## 2023-11-08 DIAGNOSIS — Z0000 Encounter for general adult medical examination without abnormal findings: Secondary | ICD-10-CM

## 2023-11-08 NOTE — Patient Instructions (Signed)
 Colleen Price , Thank you for taking time out of your busy schedule to complete your Annual Wellness Visit with me. I enjoyed our conversation and look forward to speaking with you again next year. I, as well as your care team,  appreciate your ongoing commitment to your health goals. Please review the following plan we discussed and let me know if I can assist you in the future. Your Game plan/ To Do List    Referrals: None  Follow up Visits: Next Medicare AWV with our clinical staff: 11/20/24 @ 8:00am (phone visit)   Have you seen your provider in the last 6 months (3 months if uncontrolled diabetes)? Yes Next Office Visit with your provider: 05/26/24 @ 8:00am with Laroy Plunk, PA  Clinician Recommendations: Call [facility] @ [phone] to schedule your mammogram at your earliest convenience. Aim for 30 minutes of exercise or brisk walking, 6-8 glasses of water, and 5 servings of fruits and vegetables each day.       This is a list of the screening recommended for you and due dates:  Health Maintenance  Topic Date Due   COVID-19 Vaccine (1) Never done   Hepatitis C Screening  Never done   DTaP/Tdap/Td vaccine (1 - Tdap) Never done   Zoster (Shingles) Vaccine (1 of 2) Never done   Pneumonia Vaccine (1 of 1 - PCV) Never done   Mammogram  10/25/2023   Flu Shot  01/11/2024   Medicare Annual Wellness Visit  11/07/2024   Cologuard (Stool DNA test)  05/27/2026   DEXA scan (bone density measurement)  01/02/2028   HPV Vaccine  Aged Out   Meningitis B Vaccine  Aged Out    Advanced directives: (Copy Requested) Please bring a copy of your health care power of attorney and living will to the office to be added to your chart at your convenience. You can mail to [facility] [address] [place], [place] [address] or email to [email or web address] Advance Care Planning is important because it:  [x]  Makes sure you receive the medical care that is consistent with  your values, goals, and preferences  [x]  It provides guidance to your family and loved ones and reduces their decisional burden about whether or not they are making the right decisions based on your wishes.  Follow the link provided in your after visit summary or read over the paperwork we have mailed to you to help you started getting your Advance Directives in place. If you need assistance in completing these, please reach out to us  so that we can help you!  See attachments for Preventive Care and Fall Prevention Tips.   Fall Prevention in the Home, Adult Falls can cause injuries and affect people of all ages. There are many simple things that you can do to make your home safe and to help prevent falls. If you need it, ask for help making these changes. What actions can I take to prevent falls? General information Use good lighting in all rooms. Make sure to: Replace any light bulbs that burn out. Turn on lights if it is dark and use night-lights. Keep items that you use often in easy-to-reach places. Lower the shelves around your home if needed. Move furniture so that there are clear paths around it. Do not keep throw rugs or other things on the floor that can make you trip. If any of your floors are uneven, fix them. Add color or contrast paint or tape to clearly mark  and help you see: Grab bars or handrails. First and last steps of staircases. Where the edge of each step is. If you use a ladder or stepladder: Make sure that it is fully opened. Do not climb a closed ladder. Make sure the sides of the ladder are locked in place. Have someone hold the ladder while you use it. Know where your pets are as you move through your home. What can I do in the bathroom?     Keep the floor dry. Clean up any water that is on the floor right away. Remove soap buildup in the bathtub or shower. Buildup makes bathtubs and showers slippery. Use non-skid mats or decals on the floor of the bathtub  or shower. Attach bath mats securely with double-sided, non-slip rug tape. If you need to sit down while you are in the shower, use a non-slip stool. Install grab bars by the toilet and in the bathtub and shower. Do not use towel bars as grab bars. What can I do in the bedroom? Make sure that you have a light by your bed that is easy to reach. Do not use any sheets or blankets on your bed that hang to the floor. Have a firm bench or chair with side arms that you can use for support when you get dressed. What can I do in the kitchen? Clean up any spills right away. If you need to reach something above you, use a sturdy step stool that has a grab bar. Keep electrical cables out of the way. Do not use floor polish or wax that makes floors slippery. What can I do with my stairs? Do not leave anything on the stairs. Make sure that you have a light switch at the top and the bottom of the stairs. Have them installed if you do not have them. Make sure that there are handrails on both sides of the stairs. Fix handrails that are broken or loose. Make sure that handrails are as long as the staircases. Install non-slip stair treads on all stairs in your home if they do not have carpet. Avoid having throw rugs at the top or bottom of stairs, or secure the rugs with carpet tape to prevent them from moving. Choose a carpet design that does not hide the edge of steps on the stairs. Make sure that carpet is firmly attached to the stairs. Fix any carpet that is loose or worn. What can I do on the outside of my home? Use bright outdoor lighting. Repair the edges of walkways and driveways and fix any cracks. Clear paths of anything that can make you trip, such as tools or rocks. Add color or contrast paint or tape to clearly mark and help you see high doorway thresholds. Trim any bushes or trees on the main path into your home. Check that handrails are securely fastened and in good repair. Both sides of all  steps should have handrails. Install guardrails along the edges of any raised decks or porches. Have leaves, snow, and ice cleared regularly. Use sand, salt, or ice melt on walkways during winter months if you live where there is ice and snow. In the garage, clean up any spills right away, including grease or oil spills. What other actions can I take? Review your medicines with your health care provider. Some medicines can make you confused or feel dizzy. This can increase your chance of falling. Wear closed-toe shoes that fit well and support your feet. Wear shoes that have  rubber soles and low heels. Use a cane, walker, scooter, or crutches that help you move around if needed. Talk with your provider about other ways that you can decrease your risk of falls. This may include seeing a physical therapist to learn to do exercises to improve movement and strength. Where to find more information Centers for Disease Control and Prevention, STEADI: TonerPromos.no [employer] on Aging: BaseRingTones.pl National Institute on Aging: BaseRingTones.pl Contact a health care provider if: You are afraid of falling at home. You feel weak, drowsy, or dizzy at home. You fall at home. Get help right away if you: Lose consciousness or have trouble moving after a fall. Have a fall that causes a head injury. These symptoms may be an emergency. Get help right away. Call 911. Do not wait to see if the symptoms will go away. Do not drive yourself to the hospital. This information is not intended to replace advice given to you by your health care provider. Make sure you discuss any questions you have with your health care provider. Document Revised: 01/30/2022 Document Reviewed: 01/30/2022 Elsevier Patient Education  2024 [employer].

## 2023-11-08 NOTE — Progress Notes (Signed)
 Subjective:   Colleen Price is a 68 y.o. who presents for a Medicare Wellness preventive visit.  As a reminder, Annual Wellness Visits don't include a physical exam, and some assessments may be limited, especially if this visit is performed virtually. We may recommend an in-person follow-up visit with your provider if needed.  Visit Complete: Virtual I connected with  [name] on 11/08/23 by a audio enabled telemedicine application and verified that I am speaking with the correct person using two identifiers.  Patient Location: Home  Provider Location: Home Office  I discussed the limitations of evaluation and management by telemedicine. The patient expressed understanding and agreed to proceed.  Vital Signs: Because this visit was a virtual/telehealth visit, some criteria may be missing or patient reported. Any vitals not documented were not able to be obtained and vitals that have been documented are patient reported.  VideoDeclined- This patient declined Librarian, academic. Therefore the visit was completed with audio only.  Persons Participating in Visit: Patient.  AWV Questionnaire: Yes: Patient Medicare AWV questionnaire was completed by the patient on 11/07/23; I have confirmed that all information answered by patient is correct and no changes since this date.  Cardiac Risk Factors include: advanced age (>61men, >37 women)     Objective:     Today's Vitals   11/08/23 0756  Weight: 180 lb (81.6 kg)  Height: 5\' 8"  (1.727 m)  PainSc: 2    Body mass index is 27.37 kg/m.     11/08/2023    8:08 AM 11/03/2022    1:52 PM 02/13/2018    8:07 AM 01/23/2018    6:40 AM 10/28/2015   10:44 AM 10/14/2015   10:13 AM  Advanced Directives  Does Patient Have a Medical Advance Directive? Yes Yes No No No No  Type of Estate agent of [place];Living will Living will;Healthcare Power of Attorney      Does patient want to make changes  to medical advance directive? No - Patient declined       Copy of Healthcare Power of Attorney in Chart? No - copy requested       Would patient like information on creating a medical advance directive?   No - Patient declined No - Patient declined No - patient declined information No - patient declined information    Current Medications (verified) Outpatient Encounter Medications as of 11/08/2023  Medication Sig   acetaminophen  (TYLENOL ) 325 MG tablet Take by mouth.   Multiple Vitamin (MULTI-VITAMIN) tablet Take 1 tablet by mouth daily.   meloxicam (MOBIC) 15 MG tablet Take 15 mg by mouth as needed. (Patient not taking: Reported on 11/08/2023)   No facility-administered encounter medications on file as of 11/08/2023.    Allergies (verified) Tape, Wound dressing adhesive, Conjugated estrogens, Latex, and Silicone   History: Past Medical History:  Diagnosis Date   Arthritis    KNEES   Cancer (HCC)    Complication of anesthesia    PONV (postoperative nausea and vomiting)    PT STATES SCOPALAMINE PATCH HELPED WITH HER LAST SURGERY   Past Surgical History:  Procedure Laterality Date   ABDOMINAL HYSTERECTOMY  06/12/1988   BREAST LUMPECTOMY WITH SENTINEL LYMPH NODE BIOPSY Right 10/14/2015   Procedure: BREAST LUMPECTOMY WITH SENTINEL LYMPH NODE BX;  Surgeon: Jerlean Mood, MD;  Location: [facility];  Service: General;  Laterality: Right;   BREAST SURGERY     CATARACT EXTRACTION W/PHACO Right 01/23/2018   Procedure: CATARACT EXTRACTION  PHACO AND INTRAOCULAR LENS PLACEMENT (IOC)  RIGHT;  Surgeon: Annell Kidney, MD;  Location: [facility];  Service: Ophthalmology;  Laterality: Right;   CATARACT EXTRACTION W/PHACO Left 02/13/2018   Procedure: CATARACT EXTRACTION PHACO AND INTRAOCULAR LENS PLACEMENT (IOC) LEFT;  Surgeon: Annell Kidney, MD;  Location: [facility];  Service: Ophthalmology;  Laterality: Left;   COLONOSCOPY     EYE SURGERY     JOINT  REPLACEMENT     KNEE ARTHROSCOPY     REPLACEMENT TOTAL KNEE Right    TUBAL LIGATION     Family History  Problem Relation Age of Onset   Transient ischemic attack Mother    Heart disease Father    Diabetes Father    Bladder Cancer Father    Breast cancer Sister    Social History   Socioeconomic History   Marital status: Married    Spouse name: Lavonia Powers   Number of children: 1   Years of education: Not on file   Highest education level: 12th grade  Occupational History   Occupation: retired  Tobacco Use   Smoking status: Never    Passive exposure: Past   Smokeless tobacco: Never  Vaping Use   Vaping status: Never Used  Substance and Sexual Activity   Alcohol use: No   Drug use: No   Sexual activity: Yes    Birth control/protection: Post-menopausal, None  Other Topics Concern   Not on file  Social History Narrative   1 biological child and 1 step child   Social Drivers of Corporate investment banker Strain: Low Risk  (11/08/2023)   Overall Financial Resource Strain (CARDIA)    Difficulty of Paying Living Expenses: Not hard at all  Food Insecurity: No Food Insecurity (11/08/2023)   Hunger Vital Sign    Worried About Running Out of Food in the Last Year: Never true    Ran Out of Food in the Last Year: Never true  Transportation Needs: No Transportation Needs (11/08/2023)   PRAPARE - Administrator, Civil Service (Medical): No    Lack of Transportation (Non-Medical): No  Physical Activity: Inactive (11/08/2023)   Exercise Vital Sign    Days of Exercise per Week: 0 days    Minutes of Exercise per Session: 0 min  Stress: No Stress Concern Present (11/08/2023)   [employer] of Occupational Health - Occupational Stress Questionnaire    Feeling of Stress : Not at all  Social Connections: Moderately Integrated (11/08/2023)   Social Connection and Isolation Panel [NHANES]    Frequency of Communication with Friends and Family: More than three times a week     Frequency of Social Gatherings with Friends and Family: More than three times a week    Attends Religious Services: More than 4 times per year    Active Member of [employer] or Organizations: No    Attends Engineer, structural: Never    Marital Status: Married    Tobacco Counseling Counseling given: No    Clinical Intake:  Pre-visit preparation completed: Yes  Pain : 0-10 Pain Score: 2  Pain Type: Chronic pain Pain Location: Knee Pain Orientation: Right, Left Pain Descriptors / Indicators: Aching     BMI - recorded: 27.37 Nutritional Status: BMI 25 -29 Overweight Nutritional Risks: None Diabetes: No  No results found for: "HGBA1C"   How often do you need to have someone help you when you read instructions, pamphlets, or other written materials from your doctor or pharmacy?: 1 -  Never  Interpreter Needed?: No  Information entered by :: Jaunita Messier, CMA   Activities of Daily Living     11/08/2023    8:00 AM 11/07/2023    4:49 PM  In your present state of health, do you have any difficulty performing the following activities:  Hearing? 0 0  Vision? 0 0  Difficulty concentrating or making decisions? 0 0  Walking or climbing stairs? 0 0  Dressing or bathing? 0 0  Doing errands, shopping? 0 0  Preparing Food and eating ? N N  Using the Toilet? N N  In the past six months, have you accidently leaked urine? N N  Do you have problems with loss of bowel control? N N  Managing your Medications? N N  Managing your Finances? N N  Housekeeping or managing your Housekeeping? N N    Patient Care Team: Leopoldo Rancher, PA as PCP - General (Physician Assistant) Jerlean Mood, MD (General Surgery) Annell Kidney, MD as Referring Physician (Ophthalmology)  Indicate any recent Medical Services you may have received from other than [facility] providers in the past year (date may be approximate).     Assessment:    This is a routine wellness examination  for Sharlette.  Hearing/Vision screen Hearing Screening - Comments:: Denies hearing loss Vision Screening - Comments:: Gets routine eye exams, Dr Ignatius Makos, [facility] [place]   Goals Addressed               This Visit's Progress     Increase physical activity (pt-stated)         Depression Screen     11/08/2023    8:04 AM 10/08/2023    9:22 AM 05/23/2023    8:07 AM 03/06/2023   10:44 AM 12/01/2022   11:06 AM 11/03/2022    2:22 PM 11/03/2022    1:54 PM  PHQ 2/9 Scores  PHQ - 2 Score 0 0 2 4 1 1  0  PHQ- 9 Score 3  11 9 4 5      Fall Risk     11/08/2023    8:10 AM 11/07/2023    4:49 PM 10/08/2023    9:22 AM 05/23/2023    8:08 AM 03/06/2023   10:44 AM  Fall Risk   Falls in the past year? 1 0 0 0 0  Number falls in past yr: 0 0 0 0 0  Injury with Fall? 0 0 0 0 0  Risk for fall due to : History of fall(s);Impaired balance/gait;Orthopedic patient  No Fall Risks No Fall Risks No Fall Risks  Follow up Falls evaluation completed;Education provided  Falls evaluation completed Falls evaluation completed Falls evaluation completed    MEDICARE RISK AT HOME:  Medicare Risk at Home Any stairs in or around the home?: Yes If so, are there any without handrails?: Yes Home free of loose throw rugs in walkways, pet beds, electrical cords, etc?: Yes Adequate lighting in your home to reduce risk of falls?: Yes Life alert?: No Use of a cane, walker or w/c?: No Grab bars in the bathroom?: No Shower chair or bench in shower?: No Elevated toilet seat or a handicapped toilet?: Yes  TIMED UP AND GO:  Was the test performed?  No  Cognitive Function: 6CIT completed        11/08/2023    8:11 AM 11/03/2022    1:54 PM  6CIT Screen  What Year? 0 points 0 points  What month? 0 points 0 points  What time? 0  points 0 points  Count back from 20 0 points 0 points  Months in reverse 0 points 0 points  Repeat phrase 0 points 2 points  Total Score 0 points 2 points    Immunizations  There is no  immunization history on file for this patient.  Screening Tests Health Maintenance  Topic Date Due   COVID-19 Vaccine (1) Never done   Hepatitis C Screening  Never done   DTaP/Tdap/Td (1 - Tdap) Never done   Zoster Vaccines- Shingrix (1 of 2) Never done   Colonoscopy  Never done   Pneumonia Vaccine 86+ Years old (1 of 1 - PCV) Never done   MAMMOGRAM  10/25/2023   INFLUENZA VACCINE  01/11/2024   Medicare Annual Wellness (AWV)  11/07/2024   DEXA SCAN  01/02/2028   HPV VACCINES  Aged Out   Meningococcal B Vaccine  Aged Out    Health Maintenance  Health Maintenance Due  Topic Date Due   COVID-19 Vaccine (1) Never done   Hepatitis C Screening  Never done   DTaP/Tdap/Td (1 - Tdap) Never done   Zoster Vaccines- Shingrix (1 of 2) Never done   Colonoscopy  Never done   Pneumonia Vaccine 6+ Years old (1 of 1 - PCV) Never done   MAMMOGRAM  10/25/2023   Health Maintenance Items Addressed: See Nurse Notes  Additional Screening:  Vision Screening: Recommended annual ophthalmology exams for early detection of glaucoma and other disorders of the eye.  Dental Screening: Recommended annual dental exams for proper oral hygiene  Community Resource Referral / Chronic Care Management: CRR required this visit?  No   CCM required this visit?  No   Plan:    I have personally reviewed and noted the following in the patient's chart:   Medical and social history Use of alcohol, tobacco or illicit drugs  Current medications and supplements including opioid prescriptions. Patient is not currently taking opioid prescriptions. Functional ability and status Nutritional status Physical activity Advanced directives List of other physicians Hospitalizations, surgeries, and ER visits in previous 12 months Vitals Screenings to include cognitive, depression, and falls Referrals and appointments  In addition, I have reviewed and discussed with patient certain preventive protocols, quality  metrics, and best practice recommendations. A written personalized care plan for preventive services as well as general preventive health recommendations were provided to patient.   Jaunita Messier, CMA   11/08/2023   After Visit Summary: (MyChart) Due to this being a telephonic visit, the after visit summary with patients personalized plan was offered to patient via MyChart   Notes: Please refer to Routing Comments.

## 2023-12-03 ENCOUNTER — Telehealth: Payer: Self-pay

## 2023-12-03 NOTE — Telephone Encounter (Signed)
 Please call pt to schedule an appointment for a breast exam.  KP

## 2023-12-03 NOTE — Telephone Encounter (Signed)
 Copied from CRM [id number]. Topic: Clinical - Request for Lab/Test Order >> Dec 03, 2023  9:37 AM Rosaria BRAVO wrote: Reason for CRM: Pt called to request a diagnostic bilateral, having pain at the incision site, wants that at [facility]   Fax: [phone]   If this is not possible, please contact patient [phone]

## 2023-12-03 NOTE — Telephone Encounter (Signed)
 Patient stated that she would call imaging for an appointment if she can't get in she will call back to set up appointment with Rolan

## 2024-03-31 ENCOUNTER — Encounter: Payer: Self-pay | Admitting: Physical Therapy

## 2024-03-31 ENCOUNTER — Ambulatory Visit: Attending: Orthopedic Surgery | Admitting: Physical Therapy

## 2024-03-31 DIAGNOSIS — M6281 Muscle weakness (generalized): Secondary | ICD-10-CM | POA: Diagnosis present

## 2024-03-31 DIAGNOSIS — M25661 Stiffness of right knee, not elsewhere classified: Secondary | ICD-10-CM | POA: Insufficient documentation

## 2024-03-31 DIAGNOSIS — M25561 Pain in right knee: Secondary | ICD-10-CM | POA: Insufficient documentation

## 2024-03-31 DIAGNOSIS — R262 Difficulty in walking, not elsewhere classified: Secondary | ICD-10-CM | POA: Insufficient documentation

## 2024-03-31 NOTE — Therapy (Signed)
 OUTPATIENT PHYSICAL THERAPY KNEE EVALUATION  Patient Name: Colleen Price MRN: [id number] DOB:06/14/1955, 68 y.o., female Today's Date: 03/31/2024  END OF SESSION:  PT End of Session - 03/31/24 0814     Visit Number 1    Number of Visits 13    Date for Recertification  05/15/24    PT Start Time 0815    PT Stop Time 0859    PT Time Calculation (min) 44 min    Activity Tolerance Patient tolerated treatment well    Behavior During Therapy WFL for tasks assessed/performed          Past Medical History:  Diagnosis Date   Arthritis    KNEES   Cancer (HCC)    Complication of anesthesia    PONV (postoperative nausea and vomiting)    PT STATES SCOPALAMINE PATCH HELPED WITH HER LAST SURGERY   Past Surgical History:  Procedure Laterality Date   ABDOMINAL HYSTERECTOMY  06/12/1988   BREAST LUMPECTOMY WITH SENTINEL LYMPH NODE BIOPSY Right 10/14/2015   Procedure: BREAST LUMPECTOMY WITH SENTINEL LYMPH NODE BX;  Surgeon: Louanne KANDICE Muse, MD;  Location: [facility];  Service: General;  Laterality: Right;   BREAST SURGERY     CATARACT EXTRACTION W/PHACO Right 01/23/2018   Procedure: CATARACT EXTRACTION PHACO AND INTRAOCULAR LENS PLACEMENT (IOC)  RIGHT;  Surgeon: Mittie Gaskin, MD;  Location: [facility];  Service: Ophthalmology;  Laterality: Right;   CATARACT EXTRACTION W/PHACO Left 02/13/2018   Procedure: CATARACT EXTRACTION PHACO AND INTRAOCULAR LENS PLACEMENT (IOC) LEFT;  Surgeon: Mittie Gaskin, MD;  Location: [facility];  Service: Ophthalmology;  Laterality: Left;   COLONOSCOPY     EYE SURGERY     JOINT REPLACEMENT     KNEE ARTHROSCOPY     REPLACEMENT TOTAL KNEE Right    TUBAL LIGATION     Patient Active Problem List   Diagnosis Date Noted   Degeneration of lumbar intervertebral disc 10/08/2023   Irritable bowel syndrome 05/23/2023   Vaccine refused by patient 12/01/2022   Generalized osteoarthritis of multiple sites 11/03/2022   History of  breast cancer in female 11/03/2022   Osteopenia of neck of femur 11/03/2022   History of anemia 11/03/2022   Multilevel degenerative disc disease 10/17/2021   Osteoarthritis of knee 10/17/2021   Primary localized osteoarthritis of pelvic region and thigh 10/17/2021    PCP: Manya Toribio SQUIBB, PA  REFERRING PROVIDER: Manya Toribio SQUIBB, PA  REFERRING DIAG: [phone] (ICD-10-CM) - Presence of right artificial knee joint   RATIONALE FOR EVALUATION AND TREATMENT: Rehabilitation  THERAPY DIAG: Right knee pain, unspecified chronicity  Stiffness of right knee, not elsewhere classified  Difficulty in walking, not elsewhere classified  Muscle weakness (generalized)  ONSET DATE: R TKA, DOS 04/05/23  FOLLOW-UP APPT SCHEDULED WITH REFERRING PROVIDER: None scheduled at this time   SUBJECTIVE:  SUBJECTIVE STATEMENT:  Pt is a 68 year old female known to this clinic; Hx of R TKA 04/05/23. Pt arrives today with new PT referral for R knee.   PERTINENT HISTORY: Pt is a 68 year old female known to this clinic; Hx of R TKA 04/05/23. Pt arrives today with new PT referral for R knee.   Pt currently reports R knee pain after weightbearing for prolonged periods e.g. with mowing back yard. Patient reports pain is vague and she sometimes has discomfort affecting foot/ankle. She reports tightness along medial calf. She tries to be active, but she reports paying for it after higher volume of activity in yard e.g. raking. Pt states pain is tolerable first thing in AM, but when she's weightbearing for prolonged period, she has increase in pain. Pt reports heaviness and pain from hips down to knees intermittently when rolling over in bed.   PAIN:   Pain Intensity: Present: 0/10, Best: 0/10, Worst: 8-9/10 Pain location: Vaguely  around anterior knee and anterior leg pain  Pain quality: tight presently; strong dull pain  Radiating pain: Yes ; down anterior leg Swelling: No, swelling not noted per patient  Popping, catching, locking: Intermittent popping only  Numbness/Tingling: Yes; anterior knee and into her feet  Focal weakness or buckling: Yes; occasional buckling, may give a little bit Aggravating factors: prolonged weightbearing, prolonged standing, prolonged sitting, sit to stand (after prolonged sitting especially) Relieving factors: lying down, rest 24-hour pain behavior: worse later in day with activity  How long can you sit: 1.5 hr  How long can you stand: No timeframe stated  History of prior back, hip, or knee injury, pain, surgery, or therapy: Yes; R TKA 04/05/23, R hip pain that improved s/p cortisone injection;   Imaging: Yes ; pt secondary report - no hardware misalignment or mechanical issues on radiographs Prior level of function: Independent Occupational demands: Part-time job; helping business with bookkeeping  Hobbies: Crafting Red flags: Negative for chills/fever, night sweats, nausea, vomiting, unexplained weight gain/loss, unrelenting pain   -hx of cancer, in remission 6 years   PRECAUTIONS: None  WEIGHT BEARING RESTRICTIONS: No  FALLS: Has patient fallen in last 6 months? Single fall in spring when dog charged her; pt had X-rays after this to rule out hardware issue for R knee (early Spring 2025)  Living Environment Lives with: lives with their spouse, son lives an hour away  Lives in: House/apartment 2 steps to get into home - step down into family room (2 steps) - no handrail for home entrance; home is one level. Gravel to walk into home; concrete sidewalk to get to/from car.   Patient Goals: Improved pain for RLE/R knee   OBJECTIVE:   Patient Surveys  LEFS  Extreme difficulty/unable (0), Quite a bit of difficulty (1), Moderate difficulty (2), Little difficulty (3), No  difficulty (4) Survey date:   03/31/24  Any of your usual work, housework or school activities 2  2. Usual hobbies, recreational or sporting activities 2  3. Getting into/out of the bath 0  4. Walking between rooms 4  5. Putting on socks/shoes 3  6. Squatting  1  7. Lifting an object, like a bag of groceries from the floor 4  8. Performing light activities around your home 4  9. Performing heavy activities around your home 3  10. Getting into/out of a car 4  11. Walking 2 blocks 3  12. Walking 1 mile 3  13. Going up/down 10 stairs (1 flight) 4  14. Standing for 1  hour 3  15.  sitting for 1 hour 2  16. Running on even ground 2  17. Running on uneven ground 2  18. Making sharp turns while running fast 2  19. Hopping  2  20. Rolling over in bed 1  Score total:  51/80 = 63.75%     Cognition Patient is oriented to person, place, and time.  Recent memory is intact.  Remote memory is intact.  Attention span and concentration are intact.  Expressive speech is intact.  Patient's fund of knowledge is within normal limits for educational level.    Gross Musculoskeletal Assessment Tremor: None Bulk: R quadriceps atrophy  Tone: Normal  GAIT: Distance walked: 80 ft Assistive device utilized: None Level of assistance: Complete Independence Comments: Dec stance time on RLE, R genu valgum with loading response (moderate)  Attempted squat, knees ahead of toes, dec weightbearing to heels  Posture: Moderate FHRS, R>L mild genu valgum  AROM AROM (Normal range in degrees) AROM 03/31/24  Hip Right Left  Flexion (125) WNL   Extension (15)    Abduction (40)    Adduction     Internal Rotation (45) WNL   External Rotation (45) WNL       Knee    Flexion (135) 124   Extension (0) +9       Ankle    Dorsiflexion (20) 15   Plantarflexion (50)    Inversion (35)    Eversion (15    (* = pain; Blank rows = not tested)  LE MMT: MMT (out of 5) Right 03/31/24 Left 03/31/24  Hip  flexion 4- 4  Hip extension 4 4  Hip abduction 4-   Hip adduction    Hip internal rotation    Hip external rotation    Knee flexion 4- 5  Knee extension 4 5-  Ankle dorsiflexion 5 5  Ankle plantarflexion    Ankle inversion    Ankle eversion    (* = pain; Blank rows = not tested)  Sensation Deferred  Reflexes Deferred  Muscle Length Hamstrings: R: Negative L: Negative Quadriceps ([place]): R: Positive L: Positive   Palpation Location LEFT  RIGHT           Quadriceps  0  Medial Hamstrings  1  Lateral Hamstrings  1  Lateral Hamstring tendon  1  Medial Hamstring tendon  1  Quadriceps tendon  0  Patella    Patellar Tendon  2  Tibial Tuberosity  2  Medial joint line  0  Lateral joint line  0  MCL  1  LCL  1  Adductor Tubercle    Pes Anserine tendon    Infrapatellar fat pad  0  Fibular head  1  Popliteal fossa    (Blank rows = not tested) Graded on 0-4 scale (0 = no pain, 1 = pain, 2 = pain with wincing/grimacing/flinching, 3 = pain with withdrawal, 4 = unwilling to allow palpation), (Blank rows = not tested)  Passive Accessory Motion Patellofemoral: Superior Glide: R: Negative L: Negative Inferior Glide: R: Negative L: Negative Medial Glide: R: Negative L: Negative Lateral Glide: R: Negative L: Negative    VASCULAR Deferred   SPECIAL TESTS  Patellar Tendinopathy Inferior pole palpation with anterior tilt: R: Positive L: Not examined      TODAY'S TREATMENT   03/31/24:    Therapeutic Exercise - for HEP establishment, discussion on appropriate exercise/activity modification, PT education   Reviewed baseline home exercises and provided handout for [employer] program (  see Access Code); tactile cueing and therapist demonstration utilized as needed for carryover of proper technique to HEP.    Patient education on current condition, anatomy involved, prognosis, plan of care. Discussion on activity modification to prevent flare-up of condition, including  smaller dosage of prolonged weightbearing/standing activity.    PATIENT EDUCATION:  Education details: see above for patient education details Person educated: Patient Education method: Explanation, Demonstration, and Handouts Education comprehension: verbalized understanding and returned demonstration   HOME EXERCISE PROGRAM:  Access Code: [email or web address] URL: [email or web address] Date: 03/31/2024 Prepared by: Venetia Endo  Exercises - Prone Quadriceps Stretch with Strap  - 2 x daily - 7 x weekly - 3 sets - 30sec hold - Active Straight Leg Raise with Quad Set  - 2 x daily - 7 x weekly - 2 sets - 10 reps - Sidelying Hip Abduction  - 2 x daily - 7 x weekly - 2 sets - 10 reps   ASSESSMENT:  CLINICAL IMPRESSION: Patient is a 69 y.o. female who was seen today for physical therapy evaluation and treatment for chronic R knee pain s/p R TKA 04/05/23. Pt expected to have resolution of post-op pain with routine post-op healing, but she's experienced ongoing pain affecting R anterior knee and R leg at nearly one year after surgery. Patient has notable sensitivity along R patellar tendon with more tenderness along distal substance and at tibial insertion. Pain presentation is not specific to infrapatellar/patellar tendon region and anterior knee pain is diffuse, though tendonitis may be partly affecting her condition. Pt has not had trauma to suspect ligamentous injury. Pt has current deficits in: R quad/hip/gluteal weakness, quadriceps tightness, moderate R knee hypermobile TF joint, TTP R patellar tendon, and dynamic valgus movement fault. Pt will continue to benefit from skilled PT services to address deficits and improve function.  OBJECTIVE IMPAIRMENTS: Abnormal gait, difficulty walking, decreased ROM, decreased strength, hypomobility, impaired flexibility, and pain.   ACTIVITY LIMITATIONS: lifting, bending, sitting, standing, squatting, stairs, transfers, bed mobility, and  locomotion level  PARTICIPATION LIMITATIONS: cleaning, driving, shopping, community activity, and yard work  PERSONAL FACTORS: Age, Past/current experiences, Time since onset of injury/illness/exacerbation, and 1-2 comorbidities: (Hx of breast cancer, OA) are also affecting patient's functional outcome.   REHAB POTENTIAL: Fair given length of time from Sx and chronicity  CLINICAL DECISION MAKING: Evolving/moderate complexity  EVALUATION COMPLEXITY: Moderate   GOALS: Goals reviewed with patient? Yes  SHORT TERM GOALS: Target date: 04/24/2024  Pt will be independent with HEP to improve strength and decrease knee pain to improve pain-free function at home and work. Baseline: 03/31/24: Baseline HEP initiated Goal status: INITIAL   LONG TERM GOALS: Target date: 05/15/2024  Pt will demonstrate pain-free sit to stand from standard-height chair without dynamic valgus compensation indicative of improved functional LE strength.  Baseline: 03/31/24: Pain with sit to stand, dynamic valgus movement fault. Goal status: INITIAL  2.  Pt will decrease worst knee pain by at least 3 points on the NPRS in order to demonstrate clinically significant reduction in knee pain. Baseline: 03/31/24: 8-9/10 at worst.  Goal status: INITIAL  3.  Pt will decrease LEFS score by at least 9 points in order demonstrate clinically significant reduction in knee pain/disability.       Baseline: 03/31/24: 63.75% Goal status: INITIAL  4.  Pt will increase strength of R hip and knee tested musculature to at least 4+/5 or greater MMT grade in order to demonstrate improvement in strength and function  Baseline: 03/31/24: 4- to 4/5 (  see chart above). Goal status: INITIAL   PLAN: PT FREQUENCY: 1-2x/week  PT DURATION: 6 weeks  PLANNED INTERVENTIONS: Therapeutic exercises, Therapeutic activity, Neuromuscular re-education, Balance training, Gait training, Patient/Family education, Self Care, Joint mobilization, Joint  manipulation, Vestibular training, Canalith repositioning, Orthotic/Fit training, DME instructions, Dry Needling, Electrical stimulation, Spinal manipulation, Spinal mobilization, Cryotherapy, Moist heat, Taping, Traction, Ultrasound, Ionotophoresis 4mg /ml Dexamethasone , Manual therapy, and Re-evaluation.  PLAN FOR NEXT SESSION: Quad flexibility, quad and gluteal strengthening, isometric exercise to promote stability and progressive CKC stabilization drills.    Venetia Endo, PT, DPT #[id number]  Venetia ONEIDA Endo, PT 03/31/2024, 3:03 PM

## 2024-04-02 ENCOUNTER — Ambulatory Visit: Admitting: Physical Therapy

## 2024-04-02 ENCOUNTER — Encounter: Payer: Self-pay | Admitting: Physical Therapy

## 2024-04-02 DIAGNOSIS — M25561 Pain in right knee: Secondary | ICD-10-CM | POA: Diagnosis not present

## 2024-04-02 DIAGNOSIS — M25661 Stiffness of right knee, not elsewhere classified: Secondary | ICD-10-CM

## 2024-04-02 DIAGNOSIS — R262 Difficulty in walking, not elsewhere classified: Secondary | ICD-10-CM

## 2024-04-02 DIAGNOSIS — M6281 Muscle weakness (generalized): Secondary | ICD-10-CM

## 2024-04-02 NOTE — Therapy (Signed)
 OUTPATIENT PHYSICAL THERAPY KNEE TREATMENT  Patient Name: Colleen Price MRN: [id number] DOB:[birth year], 68 y.o., female Today's Date: 04/02/2024  END OF SESSION:  PT End of Session - 04/02/24 0823     Visit Number 2    Number of Visits 13    Date for Recertification  05/15/24    PT Start Time 0816    PT Stop Time 0857    PT Time Calculation (min) 41 min    Activity Tolerance Patient tolerated treatment well    Behavior During Therapy WFL for tasks assessed/performed           Past Medical History:  Diagnosis Date   Arthritis    KNEES   Cancer (HCC)    Complication of anesthesia    PONV (postoperative nausea and vomiting)    PT STATES SCOPALAMINE PATCH HELPED WITH HER LAST SURGERY   Past Surgical History:  Procedure Laterality Date   ABDOMINAL HYSTERECTOMY  06/12/1988   BREAST LUMPECTOMY WITH SENTINEL LYMPH NODE BIOPSY Right 10/14/2015   Procedure: BREAST LUMPECTOMY WITH SENTINEL LYMPH NODE BX;  Surgeon: Louanne KANDICE Muse, MD;  Location: [facility];  Service: General;  Laterality: Right;   BREAST SURGERY     CATARACT EXTRACTION W/PHACO Right 01/23/2018   Procedure: CATARACT EXTRACTION PHACO AND INTRAOCULAR LENS PLACEMENT (IOC)  RIGHT;  Surgeon: Mittie Gaskin, MD;  Location: [facility];  Service: Ophthalmology;  Laterality: Right;   CATARACT EXTRACTION W/PHACO Left 02/13/2018   Procedure: CATARACT EXTRACTION PHACO AND INTRAOCULAR LENS PLACEMENT (IOC) LEFT;  Surgeon: Mittie Gaskin, MD;  Location: [facility];  Service: Ophthalmology;  Laterality: Left;   COLONOSCOPY     EYE SURGERY     JOINT REPLACEMENT     KNEE ARTHROSCOPY     REPLACEMENT TOTAL KNEE Right    TUBAL LIGATION     Patient Active Problem List   Diagnosis Date Noted   Degeneration of lumbar intervertebral disc 10/08/2023   Irritable bowel syndrome 05/23/2023   Vaccine refused by patient 12/01/2022   Generalized osteoarthritis of multiple sites 11/03/2022   History of  breast cancer in female 11/03/2022   Osteopenia of neck of femur 11/03/2022   History of anemia 11/03/2022   Multilevel degenerative disc disease 10/17/2021   Osteoarthritis of knee 10/17/2021   Primary localized osteoarthritis of pelvic region and thigh 10/17/2021    PCP: Manya Toribio SQUIBB, PA  REFERRING PROVIDER: Beverley Evalene BIRCH, MD  REFERRING DIAG: [phone] (ICD-10-CM) - Presence of right artificial knee joint   RATIONALE FOR EVALUATION AND TREATMENT: Rehabilitation  THERAPY DIAG: Right knee pain, unspecified chronicity  Stiffness of right knee, not elsewhere classified  Difficulty in walking, not elsewhere classified  Muscle weakness (generalized)  ONSET DATE: R TKA, DOS 04/05/23  FOLLOW-UP APPT SCHEDULED WITH REFERRING PROVIDER: None scheduled at this time  PERTINENT HISTORY: Pt is a 68 year old female known to this clinic; Hx of R TKA 04/05/23. Pt arrives today with new PT referral for R knee.   Pt currently reports R knee pain after weightbearing for prolonged periods e.g. with mowing back yard. Patient reports pain is vague and she sometimes has discomfort affecting foot/ankle. She reports tightness along medial calf. She tries to be active, but she reports paying for it after higher volume of activity in yard e.g. raking. Pt states pain is tolerable first thing in AM, but when she's weightbearing for prolonged period, she has increase in pain. Pt reports heaviness and pain from hips down to knees intermittently when rolling  over in bed.   PAIN:   Pain Intensity: Present: 0/10, Best: 0/10, Worst: 8-9/10 Pain location: Vaguely around anterior knee and anterior leg pain  Pain quality: tight presently; strong dull pain  Radiating pain: Yes ; down anterior leg Swelling: No, swelling not noted per patient  Popping, catching, locking: Intermittent popping only  Numbness/Tingling: Yes; anterior knee and into her feet  Focal weakness or buckling: Yes; occasional buckling,  may give a little bit Aggravating factors: prolonged weightbearing, prolonged standing, prolonged sitting, sit to stand (after prolonged sitting especially) Relieving factors: lying down, rest 24-hour pain behavior: worse later in day with activity  How long can you sit: 1.5 hr  How long can you stand: No timeframe stated  History of prior back, hip, or knee injury, pain, surgery, or therapy: Yes; R TKA 04/05/23, R hip pain that improved s/p cortisone injection;   Imaging: Yes ; pt secondary report - no hardware misalignment or mechanical issues on radiographs Prior level of function: Independent Occupational demands: Part-time job; helping business with bookkeeping  Hobbies: Crafting Red flags: Negative for chills/fever, night sweats, nausea, vomiting, unexplained weight gain/loss, unrelenting pain   -hx of cancer, in remission 6 years   PRECAUTIONS: None  WEIGHT BEARING RESTRICTIONS: No  FALLS: Has patient fallen in last 6 months? Single fall in spring when dog charged her; pt had X-rays after this to rule out hardware issue for R knee (early Spring 2025)  Living Environment Lives with: lives with their spouse, son lives an hour away  Lives in: House/apartment 2 steps to get into home - step down into family room (2 steps) - no handrail for home entrance; home is one level. Gravel to walk into home; concrete sidewalk to get to/from car.   Patient Goals: Improved pain for RLE/R knee    OBJECTIVE (data from initial evaluation unless otherwise dated):   Patient Surveys  LEFS  Extreme difficulty/unable (0), Quite a bit of difficulty (1), Moderate difficulty (2), Little difficulty (3), No difficulty (4) Survey date:   03/31/24  Any of your usual work, housework or school activities 2  2. Usual hobbies, recreational or sporting activities 2  3. Getting into/out of the bath 0  4. Walking between rooms 4  5. Putting on socks/shoes 3  6. Squatting  1  7. Lifting an object, like a  bag of groceries from the floor 4  8. Performing light activities around your home 4  9. Performing heavy activities around your home 3  10. Getting into/out of a car 4  11. Walking 2 blocks 3  12. Walking 1 mile 3  13. Going up/down 10 stairs (1 flight) 4  14. Standing for 1 hour 3  15.  sitting for 1 hour 2  16. Running on even ground 2  17. Running on uneven ground 2  18. Making sharp turns while running fast 2  19. Hopping  2  20. Rolling over in bed 1  Score total:  51/80 = 63.75%      Gross Musculoskeletal Assessment Tremor: None Bulk: R quadriceps atrophy  Tone: Normal  GAIT: Distance walked: 80 ft Assistive device utilized: None Level of assistance: Complete Independence Comments: Dec stance time on RLE, R genu valgum with loading response (moderate)  Attempted squat, knees ahead of toes, dec weightbearing to heels  Posture: Moderate FHRS, R>L mild genu valgum  AROM AROM (Normal range in degrees) AROM 03/31/24  Hip Right Left  Flexion (125) WNL   Extension (15)  Abduction (40)    Adduction     Internal Rotation (45) WNL   External Rotation (45) WNL       Knee    Flexion (135) 124   Extension (0) +9       Ankle    Dorsiflexion (20) 15   Plantarflexion (50)    Inversion (35)    Eversion (15    (* = pain; Blank rows = not tested)  LE MMT: MMT (out of 5) Right 03/31/24 Left 03/31/24  Hip flexion 4- 4  Hip extension 4 4  Hip abduction 4-   Hip adduction    Hip internal rotation    Hip external rotation    Knee flexion 4- 5  Knee extension 4 5-  Ankle dorsiflexion 5 5  Ankle plantarflexion    Ankle inversion    Ankle eversion    (* = pain; Blank rows = not tested)  Sensation Deferred  Reflexes Deferred  Muscle Length Hamstrings: R: Negative L: Negative Quadriceps (Ely): R: Positive L: Positive   Palpation Location LEFT  RIGHT           Quadriceps  0  Medial Hamstrings  1  Lateral Hamstrings  1  Lateral Hamstring tendon  1   Medial Hamstring tendon  1  Quadriceps tendon  0  Patella    Patellar Tendon  2  Tibial Tuberosity  2  Medial joint line  0  Lateral joint line  0  MCL  1  LCL  1  Adductor Tubercle    Pes Anserine tendon    Infrapatellar fat pad  0  Fibular head  1  Popliteal fossa    (Blank rows = not tested) Graded on 0-4 scale (0 = no pain, 1 = pain, 2 = pain with wincing/grimacing/flinching, 3 = pain with withdrawal, 4 = unwilling to allow palpation), (Blank rows = not tested)  Passive Accessory Motion Patellofemoral: Superior Glide: R: Negative L: Negative Inferior Glide: R: Negative L: Negative Medial Glide: R: Negative L: Negative Lateral Glide: R: Negative L: Negative    VASCULAR Deferred   SPECIAL TESTS  Patellar Tendinopathy Inferior pole palpation with anterior tilt: R: Positive L: Not examined      TODAY'S TREATMENT   04/02/2024:    SUBJECTIVE STATEMENT:   Pt reports doing well with quad stretching - she states this feels good. She reports pain with sidelying on her R side on floor; she completed S/L hip abduction on her bed and tolerated this better. Her symptoms are mild this AM - they are generally worse in afternoon/evening after higher volume of activity.     Therapeutic Exercise - for improved soft tissue flexibility and extensibility as needed for ROM, improved strength as needed to improve performance of CKC activities/functional movements  NuStep; Level 4, x 7 minutes - for improved soft tissue mobility and increased tissue temperature to improve muscle performance   -subjective gathered during this time  Prone alternating hip extension; 2 x 10 alt R/L    Manual Therapy - for tendon remodeling, soft tissue mobility, ROM   Patellar tendon CFM; x 3 min IASTM with Theraband roller; x 5 minutes on rectus femoris, VL, VMO Passive quadriceps stretch in prone; x 1 min    Neuromuscular Re-education - for nervous system downregulation, gluteal musculature  activation and exercises to promote LE kinetic chain stability  Bridge with Blue Tband; 2 x 10 Minisquat with 5-sec isometric; 2 x 8, 5 sec hold at bottom Lateral step-up,  6-inch step; 2 x 10  Single limb stance; 3 x 30 sec - with adjacent treadmill armrest as needed for imbalance  -intermittent unilateral UE touch prn     PATIENT EDUCATION:  Education details: see above for patient education details Person educated: Patient Education method: Explanation, Demonstration, and Handouts Education comprehension: verbalized understanding and returned demonstration   HOME EXERCISE PROGRAM:  Access Code: [email or web address] URL: [email or web address] Date: 04/02/2024 Prepared by: Venetia Endo  Exercises - Prone Quadriceps Stretch with Strap  - 2 x daily - 7 x weekly - 3 sets - 30sec hold - Active Straight Leg Raise with Quad Set  - 2 x daily - 7 x weekly - 2 sets - 10 reps - Sidelying Hip Abduction  - 2 x daily - 7 x weekly - 2 sets - 10 reps - Standing Double Leg Mini Squat  - 2 x daily - 7 x weekly - 2 sets - 10 reps - 5sec hold - Single Leg Stance  - 2 x daily - 7 x weekly - 3 sets - 30sec hold   ASSESSMENT:  CLINICAL IMPRESSION: Patient tolerates first follow-up treatment well with mild baseline symptoms early in the day. We discussed limiting volume of prolonged standing/gait and remaining active up to limits of pain. Her program was updated to include isometric stabilization exercise along with OKC quad/hip abductor isotonics given at eval. Pt demonstrates modestly improved quad length per Ely's test; pt does not have notable joint motion loss and her referring provider stated her R knee could be mildly loose. Pt has current deficits in: R quad/hip/gluteal weakness, quadriceps tightness, moderate R knee hypermobile TF joint, TTP R patellar tendon, and dynamic valgus movement fault. Pt will continue to benefit from skilled PT services to address deficits and improve  function.  OBJECTIVE IMPAIRMENTS: Abnormal gait, difficulty walking, decreased ROM, decreased strength, hypomobility, impaired flexibility, and pain.   ACTIVITY LIMITATIONS: lifting, bending, sitting, standing, squatting, stairs, transfers, bed mobility, and locomotion level  PARTICIPATION LIMITATIONS: cleaning, driving, shopping, community activity, and yard work  PERSONAL FACTORS: Age, Past/current experiences, Time since onset of injury/illness/exacerbation, and 1-2 comorbidities: (Hx of breast cancer, OA) are also affecting patient's functional outcome.   REHAB POTENTIAL: Fair given length of time from Sx and chronicity  CLINICAL DECISION MAKING: Evolving/moderate complexity  EVALUATION COMPLEXITY: Moderate   GOALS: Goals reviewed with patient? Yes  SHORT TERM GOALS: Target date: 04/24/2024  Pt will be independent with HEP to improve strength and decrease knee pain to improve pain-free function at home and work. Baseline: 03/31/24: Baseline HEP initiated Goal status: INITIAL   LONG TERM GOALS: Target date: 05/15/2024  Pt will demonstrate pain-free sit to stand from standard-height chair without dynamic valgus compensation indicative of improved functional LE strength.  Baseline: 03/31/24: Pain with sit to stand, dynamic valgus movement fault. Goal status: INITIAL  2.  Pt will decrease worst knee pain by at least 3 points on the NPRS in order to demonstrate clinically significant reduction in knee pain. Baseline: 03/31/24: 8-9/10 at worst.  Goal status: INITIAL  3.  Pt will decrease LEFS score by at least 9 points in order demonstrate clinically significant reduction in knee pain/disability.       Baseline: 03/31/24: 63.75% Goal status: INITIAL  4.  Pt will increase strength of R hip and knee tested musculature to at least 4+/5 or greater MMT grade in order to demonstrate improvement in strength and function  Baseline: 03/31/24: 4- to 4/5 (see chart above). Goal status:  INITIAL   PLAN: PT FREQUENCY: 1-2x/week  PT DURATION: 6 weeks  PLANNED INTERVENTIONS: Therapeutic exercises, Therapeutic activity, Neuromuscular re-education, Balance training, Gait training, Patient/Family education, Self Care, Joint mobilization, Joint manipulation, Vestibular training, Canalith repositioning, Orthotic/Fit training, DME instructions, Dry Needling, Electrical stimulation, Spinal manipulation, Spinal mobilization, Cryotherapy, Moist heat, Taping, Traction, Ultrasound, Ionotophoresis 4mg /ml Dexamethasone , Manual therapy, and Re-evaluation.  PLAN FOR NEXT SESSION: Quad flexibility, quad and gluteal strengthening, isometric exercise to promote stability and progressive CKC stabilization drills.    Venetia Endo, PT, DPT #[id number]  Venetia ONEIDA Endo, PT 04/02/2024, 8:23 AM

## 2024-04-07 ENCOUNTER — Ambulatory Visit: Admitting: Physical Therapy

## 2024-04-07 ENCOUNTER — Encounter: Payer: Self-pay | Admitting: Physical Therapy

## 2024-04-07 DIAGNOSIS — M25561 Pain in right knee: Secondary | ICD-10-CM | POA: Diagnosis not present

## 2024-04-07 DIAGNOSIS — M25661 Stiffness of right knee, not elsewhere classified: Secondary | ICD-10-CM

## 2024-04-07 DIAGNOSIS — R262 Difficulty in walking, not elsewhere classified: Secondary | ICD-10-CM

## 2024-04-07 DIAGNOSIS — M6281 Muscle weakness (generalized): Secondary | ICD-10-CM

## 2024-04-07 NOTE — Therapy (Signed)
 OUTPATIENT PHYSICAL THERAPY KNEE TREATMENT  Patient Name: Colleen Price MRN: [id number] DOB:24-Dec-1955, 68 y.o., female Today's Date: 04/07/2024  END OF SESSION:  PT End of Session - 04/07/24 0829     Visit Number 3    Number of Visits 13    Date for Recertification  05/15/24    PT Start Time 0826    PT Stop Time 0905    PT Time Calculation (min) 39 min    Activity Tolerance Patient tolerated treatment well    Behavior During Therapy WFL for tasks assessed/performed           Past Medical History:  Diagnosis Date   Arthritis    KNEES   Cancer (HCC)    Complication of anesthesia    PONV (postoperative nausea and vomiting)    PT STATES SCOPALAMINE PATCH HELPED WITH HER LAST SURGERY   Past Surgical History:  Procedure Laterality Date   ABDOMINAL HYSTERECTOMY  06/12/1988   BREAST LUMPECTOMY WITH SENTINEL LYMPH NODE BIOPSY Right 10/14/2015   Procedure: BREAST LUMPECTOMY WITH SENTINEL LYMPH NODE BX;  Surgeon: Louanne KANDICE Muse, MD;  Location: [facility];  Service: General;  Laterality: Right;   BREAST SURGERY     CATARACT EXTRACTION W/PHACO Right 01/23/2018   Procedure: CATARACT EXTRACTION PHACO AND INTRAOCULAR LENS PLACEMENT (IOC)  RIGHT;  Surgeon: Mittie Gaskin, MD;  Location: [facility];  Service: Ophthalmology;  Laterality: Right;   CATARACT EXTRACTION W/PHACO Left 02/13/2018   Procedure: CATARACT EXTRACTION PHACO AND INTRAOCULAR LENS PLACEMENT (IOC) LEFT;  Surgeon: Mittie Gaskin, MD;  Location: [facility];  Service: Ophthalmology;  Laterality: Left;   COLONOSCOPY     EYE SURGERY     JOINT REPLACEMENT     KNEE ARTHROSCOPY     REPLACEMENT TOTAL KNEE Right    TUBAL LIGATION     Patient Active Problem List   Diagnosis Date Noted   Degeneration of lumbar intervertebral disc 10/08/2023   Irritable bowel syndrome 05/23/2023   Vaccine refused by patient 12/01/2022   Generalized osteoarthritis of multiple sites 11/03/2022   History of  breast cancer in female 11/03/2022   Osteopenia of neck of femur 11/03/2022   History of anemia 11/03/2022   Multilevel degenerative disc disease 10/17/2021   Osteoarthritis of knee 10/17/2021   Primary localized osteoarthritis of pelvic region and thigh 10/17/2021    PCP: Manya Toribio SQUIBB, PA  REFERRING PROVIDER: Beverley Evalene BIRCH, MD  REFERRING DIAG: [phone] (ICD-10-CM) - Presence of right artificial knee joint   RATIONALE FOR EVALUATION AND TREATMENT: Rehabilitation  THERAPY DIAG: Right knee pain, unspecified chronicity  Muscle weakness (generalized)  Stiffness of right knee, not elsewhere classified  Difficulty in walking, not elsewhere classified  ONSET DATE: R TKA, DOS 04/05/23  FOLLOW-UP APPT SCHEDULED WITH REFERRING PROVIDER: None scheduled at this time  PERTINENT HISTORY: Pt is a 68 year old female known to this clinic; Hx of R TKA 04/05/23. Pt arrives today with new PT referral for R knee.   Pt currently reports R knee pain after weightbearing for prolonged periods e.g. with mowing back yard. Patient reports pain is vague and she sometimes has discomfort affecting foot/ankle. She reports tightness along medial calf. She tries to be active, but she reports paying for it after higher volume of activity in yard e.g. raking. Pt states pain is tolerable first thing in AM, but when she's weightbearing for prolonged period, she has increase in pain. Pt reports heaviness and pain from hips down to knees intermittently when rolling  over in bed.   PAIN:   Pain Intensity: Present: 0/10, Best: 0/10, Worst: 8-9/10 Pain location: Vaguely around anterior knee and anterior leg pain  Pain quality: tight presently; strong dull pain  Radiating pain: Yes ; down anterior leg Swelling: No, swelling not noted per patient  Popping, catching, locking: Intermittent popping only  Numbness/Tingling: Yes; anterior knee and into her feet  Focal weakness or buckling: Yes; occasional buckling,  may give a little bit Aggravating factors: prolonged weightbearing, prolonged standing, prolonged sitting, sit to stand (after prolonged sitting especially) Relieving factors: lying down, rest 24-hour pain behavior: worse later in day with activity  How long can you sit: 1.5 hr  How long can you stand: No timeframe stated  History of prior back, hip, or knee injury, pain, surgery, or therapy: Yes; R TKA 04/05/23, R hip pain that improved s/p cortisone injection;   Imaging: Yes ; pt secondary report - no hardware misalignment or mechanical issues on radiographs Prior level of function: Independent Occupational demands: Part-time job; helping business with bookkeeping  Hobbies: Crafting Red flags: Negative for chills/fever, night sweats, nausea, vomiting, unexplained weight gain/loss, unrelenting pain   -hx of cancer, in remission 6 years   PRECAUTIONS: None  WEIGHT BEARING RESTRICTIONS: No  FALLS: Has patient fallen in last 6 months? Single fall in spring when dog charged her; pt had X-rays after this to rule out hardware issue for R knee (early Spring 2025)  Living Environment Lives with: lives with their spouse, son lives an hour away  Lives in: House/apartment 2 steps to get into home - step down into family room (2 steps) - no handrail for home entrance; home is one level. Gravel to walk into home; concrete sidewalk to get to/from car.   Patient Goals: Improved pain for RLE/R knee    OBJECTIVE (data from initial evaluation unless otherwise dated):   Patient Surveys  LEFS  Extreme difficulty/unable (0), Quite a bit of difficulty (1), Moderate difficulty (2), Little difficulty (3), No difficulty (4) Survey date:   03/31/24  Any of your usual work, housework or school activities 2  2. Usual hobbies, recreational or sporting activities 2  3. Getting into/out of the bath 0  4. Walking between rooms 4  5. Putting on socks/shoes 3  6. Squatting  1  7. Lifting an object, like a  bag of groceries from the floor 4  8. Performing light activities around your home 4  9. Performing heavy activities around your home 3  10. Getting into/out of a car 4  11. Walking 2 blocks 3  12. Walking 1 mile 3  13. Going up/down 10 stairs (1 flight) 4  14. Standing for 1 hour 3  15.  sitting for 1 hour 2  16. Running on even ground 2  17. Running on uneven ground 2  18. Making sharp turns while running fast 2  19. Hopping  2  20. Rolling over in bed 1  Score total:  51/80 = 63.75%      Gross Musculoskeletal Assessment Tremor: None Bulk: R quadriceps atrophy  Tone: Normal  GAIT: Distance walked: 80 ft Assistive device utilized: None Level of assistance: Complete Independence Comments: Dec stance time on RLE, R genu valgum with loading response (moderate)  Attempted squat, knees ahead of toes, dec weightbearing to heels  Posture: Moderate FHRS, R>L mild genu valgum  AROM AROM (Normal range in degrees) AROM 03/31/24  Hip Right Left  Flexion (125) WNL   Extension (15)  Abduction (40)    Adduction     Internal Rotation (45) WNL   External Rotation (45) WNL       Knee    Flexion (135) 124   Extension (0) +9       Ankle    Dorsiflexion (20) 15   Plantarflexion (50)    Inversion (35)    Eversion (15    (* = pain; Blank rows = not tested)  LE MMT: MMT (out of 5) Right 03/31/24 Left 03/31/24  Hip flexion 4- 4  Hip extension 4 4  Hip abduction 4-   Hip adduction    Hip internal rotation    Hip external rotation    Knee flexion 4- 5  Knee extension 4 5-  Ankle dorsiflexion 5 5  Ankle plantarflexion    Ankle inversion    Ankle eversion    (* = pain; Blank rows = not tested)  Sensation Deferred  Reflexes Deferred  Muscle Length Hamstrings: R: Negative L: Negative Quadriceps ([place]): R: Positive L: Positive   Palpation Location LEFT  RIGHT           Quadriceps  0  Medial Hamstrings  1  Lateral Hamstrings  1  Lateral Hamstring tendon  1   Medial Hamstring tendon  1  Quadriceps tendon  0  Patella    Patellar Tendon  2  Tibial Tuberosity  2  Medial joint line  0  Lateral joint line  0  MCL  1  LCL  1  Adductor Tubercle    Pes Anserine tendon    Infrapatellar fat pad  0  Fibular head  1  Popliteal fossa    (Blank rows = not tested) Graded on 0-4 scale (0 = no pain, 1 = pain, 2 = pain with wincing/grimacing/flinching, 3 = pain with withdrawal, 4 = unwilling to allow palpation), (Blank rows = not tested)  Passive Accessory Motion Patellofemoral: Superior Glide: R: Negative L: Negative Inferior Glide: R: Negative L: Negative Medial Glide: R: Negative L: Negative Lateral Glide: R: Negative L: Negative    VASCULAR Deferred   SPECIAL TESTS  Patellar Tendinopathy Inferior pole palpation with anterior tilt: R: Positive L: Not examined      TODAY'S TREATMENT   04/07/2024:    SUBJECTIVE STATEMENT:   Pt reports soreness and throbbing this AM. She had fall festival this past Saturday and reports notable pain after prolonged standing for the festival. She reports using TENS unit and ice for pain alleviation after irritation Saturday. Patient reports compliance with her HEP.     Therapeutic Exercise - for improved soft tissue flexibility and extensibility as needed for ROM, improved strength as needed to improve performance of CKC activities/functional movements  NuStep; Level 4, x 6 minutes - for improved soft tissue mobility and increased tissue temperature to improve muscle performance   -subjective gathered during this time  SLR; 2 x 10, 3-lb ankle weight   *not today* Prone alternating hip extension; 2 x 10 alt R/L    Manual Therapy - for tendon remodeling, soft tissue mobility, ROM   IASTM with Theraband roller; x 5 minutes on rectus femoris, VL, VMO Passive quadriceps stretch in prone; 2 x 30 sec  *not today* Patellar tendon CFM; x 3 min   Neuromuscular Re-education - for nervous system  downregulation, gluteal musculature activation and exercises to promote LE kinetic chain stability  Bridge with Blue Tband; 2 x 10, 3-sec isometric  3-way touch with isometric minisquat, Red Tband around  ankles; x 8 ea dir, bilat   Lateral step-up, 6-inch step; 2 x 10  TRX reverse lunge; x 15 alternating R/L  -verbal cueing and demo to limit dynamic valgus  SLS on Airex; 2 x 20 sec with intermittent light touch    *not today* Minisquat with 5-sec isometric; 2 x 8, 5 sec hold at bottom   PATIENT EDUCATION:  Education details: see above for patient education details Person educated: Patient Education method: Explanation, Demonstration, and Handouts Education comprehension: verbalized understanding and returned demonstration   HOME EXERCISE PROGRAM:  Access Code: [email or web address] URL: [email or web address] Date: 04/02/2024 Prepared by: Venetia Endo  Exercises - Prone Quadriceps Stretch with Strap  - 2 x daily - 7 x weekly - 3 sets - 30sec hold - Active Straight Leg Raise with Quad Set  - 2 x daily - 7 x weekly - 2 sets - 10 reps - Sidelying Hip Abduction  - 2 x daily - 7 x weekly - 2 sets - 10 reps - Standing Double Leg Mini Squat  - 2 x daily - 7 x weekly - 2 sets - 10 reps - 5sec hold - Single Leg Stance  - 2 x daily - 7 x weekly - 3 sets - 30sec hold   ASSESSMENT:  CLINICAL IMPRESSION: Patient has ongoing quadriceps tightness. She is notably challenged by SLR with ankle weight added. We continued with standing CKC drills in limited ROM and stabilization exercise. Pt has moderate pain this AM, and symptoms can be severe with prolonged weightbearing s/p R TKA from just over 1 year ago. Pt has current deficits in: R quad/hip/gluteal weakness, quadriceps tightness, moderate R knee hypermobile TF joint, TTP R patellar tendon, and dynamic valgus movement fault. Pt will continue to benefit from skilled PT services to address deficits and improve function.  OBJECTIVE  IMPAIRMENTS: Abnormal gait, difficulty walking, decreased ROM, decreased strength, hypomobility, impaired flexibility, and pain.   ACTIVITY LIMITATIONS: lifting, bending, sitting, standing, squatting, stairs, transfers, bed mobility, and locomotion level  PARTICIPATION LIMITATIONS: cleaning, driving, shopping, community activity, and yard work  PERSONAL FACTORS: Age, Past/current experiences, Time since onset of injury/illness/exacerbation, and 1-2 comorbidities: (Hx of breast cancer, OA) are also affecting patient's functional outcome.   REHAB POTENTIAL: Fair given length of time from Sx and chronicity  CLINICAL DECISION MAKING: Evolving/moderate complexity  EVALUATION COMPLEXITY: Moderate   GOALS: Goals reviewed with patient? Yes  SHORT TERM GOALS: Target date: 04/24/2024  Pt will be independent with HEP to improve strength and decrease knee pain to improve pain-free function at home and work. Baseline: 03/31/24: Baseline HEP initiated Goal status: INITIAL   LONG TERM GOALS: Target date: 05/15/2024  Pt will demonstrate pain-free sit to stand from standard-height chair without dynamic valgus compensation indicative of improved functional LE strength.  Baseline: 03/31/24: Pain with sit to stand, dynamic valgus movement fault. Goal status: INITIAL  2.  Pt will decrease worst knee pain by at least 3 points on the NPRS in order to demonstrate clinically significant reduction in knee pain. Baseline: 03/31/24: 8-9/10 at worst.  Goal status: INITIAL  3.  Pt will decrease LEFS score by at least 9 points in order demonstrate clinically significant reduction in knee pain/disability.       Baseline: 03/31/24: 63.75% Goal status: INITIAL  4.  Pt will increase strength of R hip and knee tested musculature to at least 4+/5 or greater MMT grade in order to demonstrate improvement in strength and function  Baseline: 03/31/24: 4- to 4/5 (see  chart above). Goal status: INITIAL   PLAN: PT  FREQUENCY: 1-2x/week  PT DURATION: 6 weeks  PLANNED INTERVENTIONS: Therapeutic exercises, Therapeutic activity, Neuromuscular re-education, Balance training, Gait training, Patient/Family education, Self Care, Joint mobilization, Joint manipulation, Vestibular training, Canalith repositioning, Orthotic/Fit training, DME instructions, Dry Needling, Electrical stimulation, Spinal manipulation, Spinal mobilization, Cryotherapy, Moist heat, Taping, Traction, Ultrasound, Ionotophoresis 4mg /ml Dexamethasone , Manual therapy, and Re-evaluation.  PLAN FOR NEXT SESSION: Quad flexibility, quad and gluteal strengthening, isometric exercise to promote stability and progressive CKC stabilization drills.    Venetia Endo, PT, DPT #[id number]  Venetia ONEIDA Endo, PT 04/07/2024, 8:29 AM

## 2024-04-09 ENCOUNTER — Ambulatory Visit: Admitting: Physical Therapy

## 2024-04-09 ENCOUNTER — Encounter: Payer: Self-pay | Admitting: Physical Therapy

## 2024-04-09 DIAGNOSIS — M6281 Muscle weakness (generalized): Secondary | ICD-10-CM

## 2024-04-09 DIAGNOSIS — R262 Difficulty in walking, not elsewhere classified: Secondary | ICD-10-CM

## 2024-04-09 DIAGNOSIS — M25661 Stiffness of right knee, not elsewhere classified: Secondary | ICD-10-CM

## 2024-04-09 DIAGNOSIS — M25561 Pain in right knee: Secondary | ICD-10-CM | POA: Diagnosis not present

## 2024-04-09 NOTE — Therapy (Signed)
 OUTPATIENT PHYSICAL THERAPY KNEE TREATMENT  Patient Name: Colleen Price MRN: [id number] DOB:1956-06-06, 68 y.o., female Today's Date: 04/09/2024  END OF SESSION:  PT End of Session - 04/09/24 0825     Visit Number 4    Number of Visits 13    Date for Recertification  05/15/24    PT Start Time 0820    PT Stop Time 0901    PT Time Calculation (min) 41 min    Activity Tolerance Patient tolerated treatment well    Behavior During Therapy WFL for tasks assessed/performed            Past Medical History:  Diagnosis Date   Arthritis    KNEES   Cancer (HCC)    Complication of anesthesia    PONV (postoperative nausea and vomiting)    PT STATES SCOPALAMINE PATCH HELPED WITH HER LAST SURGERY   Past Surgical History:  Procedure Laterality Date   ABDOMINAL HYSTERECTOMY  06/12/1988   BREAST LUMPECTOMY WITH SENTINEL LYMPH NODE BIOPSY Right 10/14/2015   Procedure: BREAST LUMPECTOMY WITH SENTINEL LYMPH NODE BX;  Surgeon: Louanne KANDICE Muse, MD;  Location: [facility];  Service: General;  Laterality: Right;   BREAST SURGERY     CATARACT EXTRACTION W/PHACO Right 01/23/2018   Procedure: CATARACT EXTRACTION PHACO AND INTRAOCULAR LENS PLACEMENT (IOC)  RIGHT;  Surgeon: Mittie Gaskin, MD;  Location: [facility];  Service: Ophthalmology;  Laterality: Right;   CATARACT EXTRACTION W/PHACO Left 02/13/2018   Procedure: CATARACT EXTRACTION PHACO AND INTRAOCULAR LENS PLACEMENT (IOC) LEFT;  Surgeon: Mittie Gaskin, MD;  Location: [facility];  Service: Ophthalmology;  Laterality: Left;   COLONOSCOPY     EYE SURGERY     JOINT REPLACEMENT     KNEE ARTHROSCOPY     REPLACEMENT TOTAL KNEE Right    TUBAL LIGATION     Patient Active Problem List   Diagnosis Date Noted   Degeneration of lumbar intervertebral disc 10/08/2023   Irritable bowel syndrome 05/23/2023   Vaccine refused by patient 12/01/2022   Generalized osteoarthritis of multiple sites 11/03/2022   History of  breast cancer in female 11/03/2022   Osteopenia of neck of femur 11/03/2022   History of anemia 11/03/2022   Multilevel degenerative disc disease 10/17/2021   Osteoarthritis of knee 10/17/2021   Primary localized osteoarthritis of pelvic region and thigh 10/17/2021    PCP: Manya Toribio SQUIBB, PA  REFERRING PROVIDER: Beverley Evalene BIRCH, MD  REFERRING DIAG: [phone] (ICD-10-CM) - Presence of right artificial knee joint   RATIONALE FOR EVALUATION AND TREATMENT: Rehabilitation  THERAPY DIAG: Right knee pain, unspecified chronicity  Muscle weakness (generalized)  Stiffness of right knee, not elsewhere classified  Difficulty in walking, not elsewhere classified  ONSET DATE: R TKA, DOS 04/05/23  FOLLOW-UP APPT SCHEDULED WITH REFERRING PROVIDER: None scheduled at this time  PERTINENT HISTORY: Pt is a 68 year old female known to this clinic; Hx of R TKA 04/05/23. Pt arrives today with new PT referral for R knee.   Pt currently reports R knee pain after weightbearing for prolonged periods e.g. with mowing back yard. Patient reports pain is vague and she sometimes has discomfort affecting foot/ankle. She reports tightness along medial calf. She tries to be active, but she reports paying for it after higher volume of activity in yard e.g. raking. Pt states pain is tolerable first thing in AM, but when she's weightbearing for prolonged period, she has increase in pain. Pt reports heaviness and pain from hips down to knees intermittently when  rolling over in bed.   PAIN:   Pain Intensity: Present: 0/10, Best: 0/10, Worst: 8-9/10 Pain location: Vaguely around anterior knee and anterior leg pain  Pain quality: tight presently; strong dull pain  Radiating pain: Yes ; down anterior leg Swelling: No, swelling not noted per patient  Popping, catching, locking: Intermittent popping only  Numbness/Tingling: Yes; anterior knee and into her feet  Focal weakness or buckling: Yes; occasional buckling,  may give a little bit Aggravating factors: prolonged weightbearing, prolonged standing, prolonged sitting, sit to stand (after prolonged sitting especially) Relieving factors: lying down, rest 24-hour pain behavior: worse later in day with activity  How long can you sit: 1.5 hr  How long can you stand: No timeframe stated  History of prior back, hip, or knee injury, pain, surgery, or therapy: Yes; R TKA 04/05/23, R hip pain that improved s/p cortisone injection;   Imaging: Yes ; pt secondary report - no hardware misalignment or mechanical issues on radiographs Prior level of function: Independent Occupational demands: Part-time job; helping business with bookkeeping  Hobbies: Crafting Red flags: Negative for chills/fever, night sweats, nausea, vomiting, unexplained weight gain/loss, unrelenting pain   -hx of cancer, in remission 6 years   PRECAUTIONS: None  WEIGHT BEARING RESTRICTIONS: No  FALLS: Has patient fallen in last 6 months? Single fall in spring when dog charged her; pt had X-rays after this to rule out hardware issue for R knee (early Spring 2025)  Living Environment Lives with: lives with their spouse, son lives an hour away  Lives in: House/apartment 2 steps to get into home - step down into family room (2 steps) - no handrail for home entrance; home is one level. Gravel to walk into home; concrete sidewalk to get to/from car.   Patient Goals: Improved pain for RLE/R knee    OBJECTIVE (data from initial evaluation unless otherwise dated):   Patient Surveys  LEFS  Extreme difficulty/unable (0), Quite a bit of difficulty (1), Moderate difficulty (2), Little difficulty (3), No difficulty (4) Survey date:   03/31/24  Any of your usual work, housework or school activities 2  2. Usual hobbies, recreational or sporting activities 2  3. Getting into/out of the bath 0  4. Walking between rooms 4  5. Putting on socks/shoes 3  6. Squatting  1  7. Lifting an object, like a  bag of groceries from the floor 4  8. Performing light activities around your home 4  9. Performing heavy activities around your home 3  10. Getting into/out of a car 4  11. Walking 2 blocks 3  12. Walking 1 mile 3  13. Going up/down 10 stairs (1 flight) 4  14. Standing for 1 hour 3  15.  sitting for 1 hour 2  16. Running on even ground 2  17. Running on uneven ground 2  18. Making sharp turns while running fast 2  19. Hopping  2  20. Rolling over in bed 1  Score total:  51/80 = 63.75%      Gross Musculoskeletal Assessment Tremor: None Bulk: R quadriceps atrophy  Tone: Normal  GAIT: Distance walked: 80 ft Assistive device utilized: None Level of assistance: Complete Independence Comments: Dec stance time on RLE, R genu valgum with loading response (moderate)  Attempted squat, knees ahead of toes, dec weightbearing to heels  Posture: Moderate FHRS, R>L mild genu valgum  AROM AROM (Normal range in degrees) AROM 03/31/24  Hip Right Left  Flexion (125) WNL   Extension (  15)    Abduction (40)    Adduction     Internal Rotation (45) WNL   External Rotation (45) WNL       Knee    Flexion (135) 124   Extension (0) +9       Ankle    Dorsiflexion (20) 15   Plantarflexion (50)    Inversion (35)    Eversion (15    (* = pain; Blank rows = not tested)  LE MMT: MMT (out of 5) Right 03/31/24 Left 03/31/24  Hip flexion 4- 4  Hip extension 4 4  Hip abduction 4-   Hip adduction    Hip internal rotation    Hip external rotation    Knee flexion 4- 5  Knee extension 4 5-  Ankle dorsiflexion 5 5  Ankle plantarflexion    Ankle inversion    Ankle eversion    (* = pain; Blank rows = not tested)  Sensation Deferred  Reflexes Deferred  Muscle Length Hamstrings: R: Negative L: Negative Quadriceps (Ely): R: Positive L: Positive   Palpation Location LEFT  RIGHT           Quadriceps  0  Medial Hamstrings  1  Lateral Hamstrings  1  Lateral Hamstring tendon  1   Medial Hamstring tendon  1  Quadriceps tendon  0  Patella    Patellar Tendon  2  Tibial Tuberosity  2  Medial joint line  0  Lateral joint line  0  MCL  1  LCL  1  Adductor Tubercle    Pes Anserine tendon    Infrapatellar fat pad  0  Fibular head  1  Popliteal fossa    (Blank rows = not tested) Graded on 0-4 scale (0 = no pain, 1 = pain, 2 = pain with wincing/grimacing/flinching, 3 = pain with withdrawal, 4 = unwilling to allow palpation), (Blank rows = not tested)  Passive Accessory Motion Patellofemoral: Superior Glide: R: Negative L: Negative Inferior Glide: R: Negative L: Negative Medial Glide: R: Negative L: Negative Lateral Glide: R: Negative L: Negative    VASCULAR Deferred   SPECIAL TESTS  Patellar Tendinopathy Inferior pole palpation with anterior tilt: R: Positive L: Not examined      TODAY'S TREATMENT   04/09/2024:    SUBJECTIVE STATEMENT:   Pt reports no major changes; she has ongoing similar challenges with pain later in day after longer time weightbearing/walking. Patient reports minimal pain in AM typically; minimal symptoms at arrival in post-op knee. She has more discomfort affecting contralateral knee.     Therapeutic Exercise - for improved soft tissue flexibility and extensibility as needed for ROM, improved strength as needed to improve performance of CKC activities/functional movements  NuStep; Level 4-5, x 6 minutes - for improved soft tissue mobility and increased tissue temperature to improve muscle performance   -subjective gathered during this time  SLR; 2 x 10, 3-lb ankle weight   *not today* Prone alternating hip extension; 2 x 10 alt R/L     Neuromuscular Re-education - for nervous system downregulation, gluteal musculature activation and exercises to promote LE kinetic chain stability  Bridge with Blue Tband; 2 x 10, 3-sec isometric  TRX reverse lunge; x 15 alternating R/L  -verbal cueing and demo to limit dynamic  valgus  Forward step up to hurdle stance; 2 x 10, 6-inch step  Minisquat with 5-sec isometric; 2 x 8, 5 sec hold at bottom  3-way touch with isometric minisquat, Red Tband around  ankles; x 8 ea dir, bilat   SLS on Airex; 2 x 20 sec with intermittent light touch  SLS on floor; 2 x 20-30 sec   *not today* Lateral step-up, 6-inch step; 2 x 10   PATIENT EDUCATION:  Education details: see above for patient education details Person educated: Patient Education method: Explanation, Demonstration, and Handouts Education comprehension: verbalized understanding and returned demonstration   HOME EXERCISE PROGRAM:  Access Code: [email or web address] URL: [email or web address] Date: 04/02/2024 Prepared by: Venetia Endo  Exercises - Prone Quadriceps Stretch with Strap  - 2 x daily - 7 x weekly - 3 sets - 30sec hold - Active Straight Leg Raise with Quad Set  - 2 x daily - 7 x weekly - 2 sets - 10 reps - Sidelying Hip Abduction  - 2 x daily - 7 x weekly - 2 sets - 10 reps - Standing Double Leg Mini Squat  - 2 x daily - 7 x weekly - 2 sets - 10 reps - 5sec hold - Single Leg Stance  - 2 x daily - 7 x weekly - 3 sets - 30sec hold   ASSESSMENT:  CLINICAL IMPRESSION: Patient has no significant TTP along quadriceps; only mild joint line tenderness. Pt has no notable symptoms at rest. We focused intervention on quadriceps strengthening and hip strengthening with additional closed-chain stabilization drills to improve LE and knee complex stability with weightbearing. Pt has current deficits in: R quad/hip/gluteal weakness, quadriceps tightness, moderate R knee hypermobile TF joint, TTP R patellar tendon, and dynamic valgus movement fault. Pt will continue to benefit from skilled PT services to address deficits and improve function.  OBJECTIVE IMPAIRMENTS: Abnormal gait, difficulty walking, decreased ROM, decreased strength, hypomobility, impaired flexibility, and pain.   ACTIVITY LIMITATIONS:  lifting, bending, sitting, standing, squatting, stairs, transfers, bed mobility, and locomotion level  PARTICIPATION LIMITATIONS: cleaning, driving, shopping, community activity, and yard work  PERSONAL FACTORS: Age, Past/current experiences, Time since onset of injury/illness/exacerbation, and 1-2 comorbidities: (Hx of breast cancer, OA) are also affecting patient's functional outcome.   REHAB POTENTIAL: Fair given length of time from Sx and chronicity  CLINICAL DECISION MAKING: Evolving/moderate complexity  EVALUATION COMPLEXITY: Moderate   GOALS: Goals reviewed with patient? Yes  SHORT TERM GOALS: Target date: 04/24/2024  Pt will be independent with HEP to improve strength and decrease knee pain to improve pain-free function at home and work. Baseline: 03/31/24: Baseline HEP initiated Goal status: INITIAL   LONG TERM GOALS: Target date: 05/15/2024  Pt will demonstrate pain-free sit to stand from standard-height chair without dynamic valgus compensation indicative of improved functional LE strength.  Baseline: 03/31/24: Pain with sit to stand, dynamic valgus movement fault. Goal status: INITIAL  2.  Pt will decrease worst knee pain by at least 3 points on the NPRS in order to demonstrate clinically significant reduction in knee pain. Baseline: 03/31/24: 8-9/10 at worst.  Goal status: INITIAL  3.  Pt will decrease LEFS score by at least 9 points in order demonstrate clinically significant reduction in knee pain/disability.       Baseline: 03/31/24: 63.75% Goal status: INITIAL  4.  Pt will increase strength of R hip and knee tested musculature to at least 4+/5 or greater MMT grade in order to demonstrate improvement in strength and function  Baseline: 03/31/24: 4- to 4/5 (see chart above). Goal status: INITIAL   PLAN: PT FREQUENCY: 1-2x/week  PT DURATION: 6 weeks  PLANNED INTERVENTIONS: Therapeutic exercises, Therapeutic activity, Neuromuscular re-education, Balance  training, Gait training, Patient/Family  education, Self Care, Joint mobilization, Joint manipulation, Vestibular training, Canalith repositioning, Orthotic/Fit training, DME instructions, Dry Needling, Electrical stimulation, Spinal manipulation, Spinal mobilization, Cryotherapy, Moist heat, Taping, Traction, Ultrasound, Ionotophoresis 4mg /ml Dexamethasone , Manual therapy, and Re-evaluation.  PLAN FOR NEXT SESSION: Quad flexibility, quad and gluteal strengthening, isometric exercise to promote stability and progressive CKC stabilization drills.    Venetia Endo, PT, DPT #[id number]  Venetia ONEIDA Endo, PT 04/09/2024, 8:26 AM

## 2024-04-14 ENCOUNTER — Ambulatory Visit: Attending: Orthopedic Surgery | Admitting: Physical Therapy

## 2024-04-14 ENCOUNTER — Encounter: Payer: Self-pay | Admitting: Physical Therapy

## 2024-04-14 DIAGNOSIS — M25661 Stiffness of right knee, not elsewhere classified: Secondary | ICD-10-CM | POA: Insufficient documentation

## 2024-04-14 DIAGNOSIS — M25561 Pain in right knee: Secondary | ICD-10-CM | POA: Insufficient documentation

## 2024-04-14 DIAGNOSIS — M6281 Muscle weakness (generalized): Secondary | ICD-10-CM | POA: Diagnosis present

## 2024-04-14 DIAGNOSIS — R262 Difficulty in walking, not elsewhere classified: Secondary | ICD-10-CM | POA: Insufficient documentation

## 2024-04-14 NOTE — Therapy (Signed)
 OUTPATIENT PHYSICAL THERAPY KNEE TREATMENT  Patient Name: Colleen Price MRN: [id number] DOB:1955-10-24, 68 y.o., female Today's Date: 04/14/2024  END OF SESSION:  PT End of Session - 04/14/24 0829     Visit Number 5    Number of Visits 13    Date for Recertification  05/15/24    PT Start Time 0822    PT Stop Time 0905    PT Time Calculation (min) 43 min    Activity Tolerance Patient tolerated treatment well    Behavior During Therapy WFL for tasks assessed/performed          Past Medical History:  Diagnosis Date   Arthritis    KNEES   Cancer (HCC)    Complication of anesthesia    PONV (postoperative nausea and vomiting)    PT STATES SCOPALAMINE PATCH HELPED WITH HER LAST SURGERY   Past Surgical History:  Procedure Laterality Date   ABDOMINAL HYSTERECTOMY  06/12/1988   BREAST LUMPECTOMY WITH SENTINEL LYMPH NODE BIOPSY Right 10/14/2015   Procedure: BREAST LUMPECTOMY WITH SENTINEL LYMPH NODE BX;  Surgeon: Louanne KANDICE Muse, MD;  Location: [facility];  Service: General;  Laterality: Right;   BREAST SURGERY     CATARACT EXTRACTION W/PHACO Right 01/23/2018   Procedure: CATARACT EXTRACTION PHACO AND INTRAOCULAR LENS PLACEMENT (IOC)  RIGHT;  Surgeon: Mittie Gaskin, MD;  Location: [facility];  Service: Ophthalmology;  Laterality: Right;   CATARACT EXTRACTION W/PHACO Left 02/13/2018   Procedure: CATARACT EXTRACTION PHACO AND INTRAOCULAR LENS PLACEMENT (IOC) LEFT;  Surgeon: Mittie Gaskin, MD;  Location: [facility];  Service: Ophthalmology;  Laterality: Left;   COLONOSCOPY     EYE SURGERY     JOINT REPLACEMENT     KNEE ARTHROSCOPY     REPLACEMENT TOTAL KNEE Right    TUBAL LIGATION     Patient Active Problem List   Diagnosis Date Noted   Degeneration of lumbar intervertebral disc 10/08/2023   Irritable bowel syndrome 05/23/2023   Vaccine refused by patient 12/01/2022   Generalized osteoarthritis of multiple sites 11/03/2022   History of  breast cancer in female 11/03/2022   Osteopenia of neck of femur 11/03/2022   History of anemia 11/03/2022   Multilevel degenerative disc disease 10/17/2021   Osteoarthritis of knee 10/17/2021   Primary localized osteoarthritis of pelvic region and thigh 10/17/2021    PCP: Manya Toribio SQUIBB, PA  REFERRING PROVIDER: Beverley Evalene BIRCH, MD  REFERRING DIAG: [phone] (ICD-10-CM) - Presence of right artificial knee joint   RATIONALE FOR EVALUATION AND TREATMENT: Rehabilitation  THERAPY DIAG: Right knee pain, unspecified chronicity  Muscle weakness (generalized)  Stiffness of right knee, not elsewhere classified  Difficulty in walking, not elsewhere classified  ONSET DATE: R TKA, DOS 04/05/23  FOLLOW-UP APPT SCHEDULED WITH REFERRING PROVIDER: None scheduled at this time  PERTINENT HISTORY: Pt is a 68 year old female known to this clinic; Hx of R TKA 04/05/23. Pt arrives today with new PT referral for R knee.   Pt currently reports R knee pain after weightbearing for prolonged periods e.g. with mowing back yard. Patient reports pain is vague and she sometimes has discomfort affecting foot/ankle. She reports tightness along medial calf. She tries to be active, but she reports paying for it after higher volume of activity in yard e.g. raking. Pt states pain is tolerable first thing in AM, but when she's weightbearing for prolonged period, she has increase in pain. Pt reports heaviness and pain from hips down to knees intermittently when rolling over  in bed.   PAIN:   Pain Intensity: Present: 0/10, Best: 0/10, Worst: 8-9/10 Pain location: Vaguely around anterior knee and anterior leg pain  Pain quality: tight presently; strong dull pain  Radiating pain: Yes ; down anterior leg Swelling: No, swelling not noted per patient  Popping, catching, locking: Intermittent popping only  Numbness/Tingling: Yes; anterior knee and into her feet  Focal weakness or buckling: Yes; occasional buckling,  may give a little bit Aggravating factors: prolonged weightbearing, prolonged standing, prolonged sitting, sit to stand (after prolonged sitting especially) Relieving factors: lying down, rest 24-hour pain behavior: worse later in day with activity  How long can you sit: 1.5 hr  How long can you stand: No timeframe stated  History of prior back, hip, or knee injury, pain, surgery, or therapy: Yes; R TKA 04/05/23, R hip pain that improved s/p cortisone injection;   Imaging: Yes ; pt secondary report - no hardware misalignment or mechanical issues on radiographs Prior level of function: Independent Occupational demands: Part-time job; helping business with bookkeeping  Hobbies: Crafting Red flags: Negative for chills/fever, night sweats, nausea, vomiting, unexplained weight gain/loss, unrelenting pain   -hx of cancer, in remission 6 years   PRECAUTIONS: None  WEIGHT BEARING RESTRICTIONS: No  FALLS: Has patient fallen in last 6 months? Single fall in spring when dog charged her; pt had X-rays after this to rule out hardware issue for R knee (early Spring 2025)  Living Environment Lives with: lives with their spouse, son lives an hour away  Lives in: House/apartment 2 steps to get into home - step down into family room (2 steps) - no handrail for home entrance; home is one level. Gravel to walk into home; concrete sidewalk to get to/from car.   Patient Goals: Improved pain for RLE/R knee    OBJECTIVE (data from initial evaluation unless otherwise dated):   Patient Surveys  LEFS  Extreme difficulty/unable (0), Quite a bit of difficulty (1), Moderate difficulty (2), Little difficulty (3), No difficulty (4) Survey date:   03/31/24  Any of your usual work, housework or school activities 2  2. Usual hobbies, recreational or sporting activities 2  3. Getting into/out of the bath 0  4. Walking between rooms 4  5. Putting on socks/shoes 3  6. Squatting  1  7. Lifting an object, like a  bag of groceries from the floor 4  8. Performing light activities around your home 4  9. Performing heavy activities around your home 3  10. Getting into/out of a car 4  11. Walking 2 blocks 3  12. Walking 1 mile 3  13. Going up/down 10 stairs (1 flight) 4  14. Standing for 1 hour 3  15.  sitting for 1 hour 2  16. Running on even ground 2  17. Running on uneven ground 2  18. Making sharp turns while running fast 2  19. Hopping  2  20. Rolling over in bed 1  Score total:  51/80 = 63.75%      Gross Musculoskeletal Assessment Tremor: None Bulk: R quadriceps atrophy  Tone: Normal  GAIT: Distance walked: 80 ft Assistive device utilized: None Level of assistance: Complete Independence Comments: Dec stance time on RLE, R genu valgum with loading response (moderate)  Attempted squat, knees ahead of toes, dec weightbearing to heels  Posture: Moderate FHRS, R>L mild genu valgum  AROM AROM (Normal range in degrees) AROM 03/31/24  Hip Right Left  Flexion (125) WNL   Extension (15)  Abduction (40)    Adduction     Internal Rotation (45) WNL   External Rotation (45) WNL       Knee    Flexion (135) 124   Extension (0) +9       Ankle    Dorsiflexion (20) 15   Plantarflexion (50)    Inversion (35)    Eversion (15    (* = pain; Blank rows = not tested)  LE MMT: MMT (out of 5) Right 03/31/24 Left 03/31/24  Hip flexion 4- 4  Hip extension 4 4  Hip abduction 4-   Hip adduction    Hip internal rotation    Hip external rotation    Knee flexion 4- 5  Knee extension 4 5-  Ankle dorsiflexion 5 5  Ankle plantarflexion    Ankle inversion    Ankle eversion    (* = pain; Blank rows = not tested)  Sensation Deferred  Reflexes Deferred  Muscle Length Hamstrings: R: Negative L: Negative Quadriceps (Ely): R: Positive L: Positive   Palpation Location LEFT  RIGHT           Quadriceps  0  Medial Hamstrings  1  Lateral Hamstrings  1  Lateral Hamstring tendon  1   Medial Hamstring tendon  1  Quadriceps tendon  0  Patella    Patellar Tendon  2  Tibial Tuberosity  2  Medial joint line  0  Lateral joint line  0  MCL  1  LCL  1  Adductor Tubercle    Pes Anserine tendon    Infrapatellar fat pad  0  Fibular head  1  Popliteal fossa    (Blank rows = not tested) Graded on 0-4 scale (0 = no pain, 1 = pain, 2 = pain with wincing/grimacing/flinching, 3 = pain with withdrawal, 4 = unwilling to allow palpation), (Blank rows = not tested)  Passive Accessory Motion Patellofemoral: Superior Glide: R: Negative L: Negative Inferior Glide: R: Negative L: Negative Medial Glide: R: Negative L: Negative Lateral Glide: R: Negative L: Negative    VASCULAR Deferred   SPECIAL TESTS  Patellar Tendinopathy Inferior pole palpation with anterior tilt: R: Positive L: Not examined      TODAY'S TREATMENT   04/14/2024:    SUBJECTIVE STATEMENT:   Pt reports 4/10 pain affecting R knee at arrival. Her non-operative (L) knee hurts sometimes too. Pt reports pain after period of walking, and she is discouraged with persistnet pain after R TKA.     Therapeutic Exercise - for improved soft tissue flexibility and extensibility as needed for ROM, improved strength as needed to improve performance of CKC activities/functional movements  NuStep; Level 5, x 6 minutes - for improved soft tissue mobility and increased tissue temperature to improve muscle performance   -subjective gathered during this time  Passive prone quad stretch; 3 x 30 sec for R quadriceps  SLR; 2 x 10, 4-lb ankle weight   Pt edu: HEP update and review. New Med[employer] handout provided.   *not today* Prone alternating hip extension; 2 x 10 alt R/L     Neuromuscular Re-education - for nervous system downregulation, gluteal musculature activation and exercises to promote LE kinetic chain stability  STM and IASTM with Theraband roller along R quads; x 6 minutes  __________  Brion with  Black Tband; 2 x 10, 3-sec isometric  Forward step up to hurdle stance; 2 x 10, 6-inch step  TRX reverse lunge; 2 x 10  -verbal cueing and  demo to limit dynamic valgus  SLS on floor; 3 x 20 sec  Monster walk; Blue Tband just above patellae; 3x D/B length of // bars  -catching pain along contralateral (L) knee with successive steps, stopped at 3 reps due to this      *not today* 3-way touch with isometric minisquat, Red Tband around ankles; x 8 ea dir, bilat  Minisquat with 5-sec isometric; 2 x 8, 5 sec hold at bottom SLS on Airex; 2 x 20 sec with intermittent light touch Lateral step-up, 6-inch step; 2 x 10   PATIENT EDUCATION:  Education details: see above for patient education details Person educated: Patient Education method: Explanation, Demonstration, and Handouts Education comprehension: verbalized understanding and returned demonstration   HOME EXERCISE PROGRAM:  Access Code: [email or web address] URL: [email or web address] Date: 04/14/2024 Prepared by: Venetia Endo  Exercises - Prone Quadriceps Stretch with Strap  - 2 x daily - 7 x weekly - 3 sets - 30sec hold - Active Straight Leg Raise with Quad Set  - 2 x daily - 7 x weekly - 2 sets - 10 reps - Sidelying Hip Abduction  - 2 x daily - 7 x weekly - 2 sets - 10 reps - Standing Double Leg Mini Squat  - 2 x daily - 7 x weekly - 2 sets - 10 reps - 5sec hold - Single Leg Stance  - 2 x daily - 7 x weekly - 3 sets - 30sec hold - Supine Bridge with Resistance Band  - 2 x daily - 5-7 x weekly - 2 sets - 10 reps - Standing 3-Way Leg Reach with Resistance at Ankles and Unilateral Counter Support  - 2 x daily - 5-7 x weekly - 2 sets - 10 reps   ASSESSMENT:  CLINICAL IMPRESSION: Patient has modestly improving Ely's test, but she has remaining quadriceps tightness and needs further work on stretching with her HEP. Pt is discouraged with persistent pain with being just over a year from her R TKA. She has no notable  motion loss or gross swelling. She does have moderate R quadriceps atrophy and hip/knee weakness necessitating further intervention. Pt may consider follow-up with orthopedics to consider further options if trial of conservative rehab over 4-6 weeks is not successful. Pt has current deficits in: R quad/hip/gluteal weakness, quadriceps tightness, moderate R knee hypermobile TF joint, TTP R patellar tendon, and dynamic valgus movement fault. Pt will continue to benefit from skilled PT services to address deficits and improve function.  OBJECTIVE IMPAIRMENTS: Abnormal gait, difficulty walking, decreased ROM, decreased strength, hypomobility, impaired flexibility, and pain.   ACTIVITY LIMITATIONS: lifting, bending, sitting, standing, squatting, stairs, transfers, bed mobility, and locomotion level  PARTICIPATION LIMITATIONS: cleaning, driving, shopping, community activity, and yard work  PERSONAL FACTORS: Age, Past/current experiences, Time since onset of injury/illness/exacerbation, and 1-2 comorbidities: (Hx of breast cancer, OA) are also affecting patient's functional outcome.   REHAB POTENTIAL: Fair given length of time from Sx and chronicity  CLINICAL DECISION MAKING: Evolving/moderate complexity  EVALUATION COMPLEXITY: Moderate   GOALS: Goals reviewed with patient? Yes  SHORT TERM GOALS: Target date: 04/24/2024  Pt will be independent with HEP to improve strength and decrease knee pain to improve pain-free function at home and work. Baseline: 03/31/24: Baseline HEP initiated Goal status: INITIAL   LONG TERM GOALS: Target date: 05/15/2024  Pt will demonstrate pain-free sit to stand from standard-height chair without dynamic valgus compensation indicative of improved functional LE strength.  Baseline: 03/31/24: Pain with sit to stand, dynamic  valgus movement fault. Goal status: INITIAL  2.  Pt will decrease worst knee pain by at least 3 points on the NPRS in order to demonstrate  clinically significant reduction in knee pain. Baseline: 03/31/24: 8-9/10 at worst.  Goal status: INITIAL  3.  Pt will decrease LEFS score by at least 9 points in order demonstrate clinically significant reduction in knee pain/disability.       Baseline: 03/31/24: 63.75% Goal status: INITIAL  4.  Pt will increase strength of R hip and knee tested musculature to at least 4+/5 or greater MMT grade in order to demonstrate improvement in strength and function  Baseline: 03/31/24: 4- to 4/5 (see chart above). Goal status: INITIAL   PLAN: PT FREQUENCY: 1-2x/week  PT DURATION: 6 weeks  PLANNED INTERVENTIONS: Therapeutic exercises, Therapeutic activity, Neuromuscular re-education, Balance training, Gait training, Patient/Family education, Self Care, Joint mobilization, Joint manipulation, Vestibular training, Canalith repositioning, Orthotic/Fit training, DME instructions, Dry Needling, Electrical stimulation, Spinal manipulation, Spinal mobilization, Cryotherapy, Moist heat, Taping, Traction, Ultrasound, Ionotophoresis 4mg /ml Dexamethasone , Manual therapy, and Re-evaluation.  PLAN FOR NEXT SESSION: Quad flexibility, quad and gluteal strengthening, isometric exercise to promote stability and progressive CKC stabilization drills.    Venetia Endo, PT, DPT #[id number]  Venetia ONEIDA Endo, PT 04/14/2024, 10:12 AM

## 2024-04-16 ENCOUNTER — Ambulatory Visit: Admitting: Physical Therapy

## 2024-04-16 DIAGNOSIS — M25561 Pain in right knee: Secondary | ICD-10-CM

## 2024-04-16 DIAGNOSIS — M6281 Muscle weakness (generalized): Secondary | ICD-10-CM

## 2024-04-16 DIAGNOSIS — M25661 Stiffness of right knee, not elsewhere classified: Secondary | ICD-10-CM

## 2024-04-16 DIAGNOSIS — R262 Difficulty in walking, not elsewhere classified: Secondary | ICD-10-CM

## 2024-04-16 NOTE — Therapy (Signed)
 OUTPATIENT PHYSICAL THERAPY KNEE TREATMENT  Patient Name: Colleen Price MRN: [id number] DOB:January 10, 1956, 68 y.o., female Today's Date: 04/16/2024  END OF SESSION:  PT End of Session - 04/16/24 0818     Visit Number 6    Number of Visits 13    Date for Recertification  05/15/24    PT Start Time 0817    PT Stop Time 0857    PT Time Calculation (min) 40 min    Activity Tolerance Patient tolerated treatment well    Behavior During Therapy WFL for tasks assessed/performed           Past Medical History:  Diagnosis Date   Arthritis    KNEES   Cancer (HCC)    Complication of anesthesia    PONV (postoperative nausea and vomiting)    PT STATES SCOPALAMINE PATCH HELPED WITH HER LAST SURGERY   Past Surgical History:  Procedure Laterality Date   ABDOMINAL HYSTERECTOMY  06/12/1988   BREAST LUMPECTOMY WITH SENTINEL LYMPH NODE BIOPSY Right 10/14/2015   Procedure: BREAST LUMPECTOMY WITH SENTINEL LYMPH NODE BX;  Surgeon: Louanne KANDICE Muse, MD;  Location: [facility];  Service: General;  Laterality: Right;   BREAST SURGERY     CATARACT EXTRACTION W/PHACO Right 01/23/2018   Procedure: CATARACT EXTRACTION PHACO AND INTRAOCULAR LENS PLACEMENT (IOC)  RIGHT;  Surgeon: Mittie Gaskin, MD;  Location: [facility];  Service: Ophthalmology;  Laterality: Right;   CATARACT EXTRACTION W/PHACO Left 02/13/2018   Procedure: CATARACT EXTRACTION PHACO AND INTRAOCULAR LENS PLACEMENT (IOC) LEFT;  Surgeon: Mittie Gaskin, MD;  Location: [facility];  Service: Ophthalmology;  Laterality: Left;   COLONOSCOPY     EYE SURGERY     JOINT REPLACEMENT     KNEE ARTHROSCOPY     REPLACEMENT TOTAL KNEE Right    TUBAL LIGATION     Patient Active Problem List   Diagnosis Date Noted   Degeneration of lumbar intervertebral disc 10/08/2023   Irritable bowel syndrome 05/23/2023   Vaccine refused by patient 12/01/2022   Generalized osteoarthritis of multiple sites 11/03/2022   History of  breast cancer in female 11/03/2022   Osteopenia of neck of femur 11/03/2022   History of anemia 11/03/2022   Multilevel degenerative disc disease 10/17/2021   Osteoarthritis of knee 10/17/2021   Primary localized osteoarthritis of pelvic region and thigh 10/17/2021    PCP: Manya Toribio SQUIBB, PA  REFERRING PROVIDER: Beverley Evalene BIRCH, MD  REFERRING DIAG: [phone] (ICD-10-CM) - Presence of right artificial knee joint   RATIONALE FOR EVALUATION AND TREATMENT: Rehabilitation  THERAPY DIAG: Right knee pain, unspecified chronicity  Muscle weakness (generalized)  Stiffness of right knee, not elsewhere classified  Difficulty in walking, not elsewhere classified  ONSET DATE: R TKA, DOS 04/05/23  FOLLOW-UP APPT SCHEDULED WITH REFERRING PROVIDER: None scheduled at this time  PERTINENT HISTORY: Pt is a 68 year old female known to this clinic; Hx of R TKA 04/05/23. Pt arrives today with new PT referral for R knee.   Pt currently reports R knee pain after weightbearing for prolonged periods e.g. with mowing back yard. Patient reports pain is vague and she sometimes has discomfort affecting foot/ankle. She reports tightness along medial calf. She tries to be active, but she reports paying for it after higher volume of activity in yard e.g. raking. Pt states pain is tolerable first thing in AM, but when she's weightbearing for prolonged period, she has increase in pain. Pt reports heaviness and pain from hips down to knees intermittently when rolling  over in bed.   PAIN:   Pain Intensity: Present: 0/10, Best: 0/10, Worst: 8-9/10 Pain location: Vaguely around anterior knee and anterior leg pain  Pain quality: tight presently; strong dull pain  Radiating pain: Yes ; down anterior leg Swelling: No, swelling not noted per patient  Popping, catching, locking: Intermittent popping only  Numbness/Tingling: Yes; anterior knee and into her feet  Focal weakness or buckling: Yes; occasional buckling,  may give a little bit Aggravating factors: prolonged weightbearing, prolonged standing, prolonged sitting, sit to stand (after prolonged sitting especially) Relieving factors: lying down, rest 24-hour pain behavior: worse later in day with activity  How long can you sit: 1.5 hr  How long can you stand: No timeframe stated  History of prior back, hip, or knee injury, pain, surgery, or therapy: Yes; R TKA 04/05/23, R hip pain that improved s/p cortisone injection;   Imaging: Yes ; pt secondary report - no hardware misalignment or mechanical issues on radiographs Prior level of function: Independent Occupational demands: Part-time job; helping business with bookkeeping  Hobbies: Crafting Red flags: Negative for chills/fever, night sweats, nausea, vomiting, unexplained weight gain/loss, unrelenting pain   -hx of cancer, in remission 6 years   PRECAUTIONS: None  WEIGHT BEARING RESTRICTIONS: No  FALLS: Has patient fallen in last 6 months? Single fall in spring when dog charged her; pt had X-rays after this to rule out hardware issue for R knee (early Spring 2025)  Living Environment Lives with: lives with their spouse, son lives an hour away  Lives in: House/apartment 2 steps to get into home - step down into family room (2 steps) - no handrail for home entrance; home is one level. Gravel to walk into home; concrete sidewalk to get to/from car.   Patient Goals: Improved pain for RLE/R knee    OBJECTIVE (data from initial evaluation unless otherwise dated):   Patient Surveys  LEFS  Extreme difficulty/unable (0), Quite a bit of difficulty (1), Moderate difficulty (2), Little difficulty (3), No difficulty (4) Survey date:   03/31/24  Any of your usual work, housework or school activities 2  2. Usual hobbies, recreational or sporting activities 2  3. Getting into/out of the bath 0  4. Walking between rooms 4  5. Putting on socks/shoes 3  6. Squatting  1  7. Lifting an object, like a  bag of groceries from the floor 4  8. Performing light activities around your home 4  9. Performing heavy activities around your home 3  10. Getting into/out of a car 4  11. Walking 2 blocks 3  12. Walking 1 mile 3  13. Going up/down 10 stairs (1 flight) 4  14. Standing for 1 hour 3  15.  sitting for 1 hour 2  16. Running on even ground 2  17. Running on uneven ground 2  18. Making sharp turns while running fast 2  19. Hopping  2  20. Rolling over in bed 1  Score total:  51/80 = 63.75%      Gross Musculoskeletal Assessment Tremor: None Bulk: R quadriceps atrophy  Tone: Normal  GAIT: Distance walked: 80 ft Assistive device utilized: None Level of assistance: Complete Independence Comments: Dec stance time on RLE, R genu valgum with loading response (moderate)  Attempted squat, knees ahead of toes, dec weightbearing to heels  Posture: Moderate FHRS, R>L mild genu valgum  AROM AROM (Normal range in degrees) AROM 03/31/24  Hip Right Left  Flexion (125) WNL   Extension (15)  Abduction (40)    Adduction     Internal Rotation (45) WNL   External Rotation (45) WNL       Knee    Flexion (135) 124   Extension (0) +9       Ankle    Dorsiflexion (20) 15   Plantarflexion (50)    Inversion (35)    Eversion (15    (* = pain; Blank rows = not tested)  LE MMT: MMT (out of 5) Right 03/31/24 Left 03/31/24  Hip flexion 4- 4  Hip extension 4 4  Hip abduction 4-   Hip adduction    Hip internal rotation    Hip external rotation    Knee flexion 4- 5  Knee extension 4 5-  Ankle dorsiflexion 5 5  Ankle plantarflexion    Ankle inversion    Ankle eversion    (* = pain; Blank rows = not tested)  Sensation Deferred  Reflexes Deferred  Muscle Length Hamstrings: R: Negative L: Negative Quadriceps (E[place]): R: Positive L: Positive   Palpation Location LEFT  RIGHT           Quadriceps  0  Medial Hamstrings  1  Lateral Hamstrings  1  Lateral Hamstring tendon  1   Medial Hamstring tendon  1  Quadriceps tendon  0  Patella    Patellar Tendon  2  Tibial Tuberosity  2  Medial joint line  0  Lateral joint line  0  MCL  1  LCL  1  Adductor Tubercle    Pes Anserine tendon    Infrapatellar fat pad  0  Fibular head  1  Popliteal fossa    (Blank rows = not tested) Graded on 0-4 scale (0 = no pain, 1 = pain, 2 = pain with wincing/grimacing/flinching, 3 = pain with withdrawal, 4 = unwilling to allow palpation), (Blank rows = not tested)  Passive Accessory Motion Patellofemoral: Superior Glide: R: Negative L: Negative Inferior Glide: R: Negative L: Negative Medial Glide: R: Negative L: Negative Lateral Glide: R: Negative L: Negative    VASCULAR Deferred   SPECIAL TESTS  Patellar Tendinopathy Inferior pole palpation with anterior tilt: R: Positive L: Not examined      TODAY'S TREATMENT   04/16/2024:    SUBJECTIVE STATEMENT:   Pt reports difficulty with CKC loading on L knee recently. She reports some challenge with L knee pushing into extension against resistance. Patient reports 5/10 pain at arrival to PT.     Therapeutic Exercise - for improved soft tissue flexibility and extensibility as needed for ROM, improved strength as needed to improve performance of CKC activities/functional movements  NuStep; Level 4-5, x 6 minutes - for improved soft tissue mobility and increased tissue temperature to improve muscle performance   -subjective gathered during this time  SLR; 2 x 10, 4-lb ankle weight   Pt edu: HEP reviewed.    *not today* Passive prone quad stretch; 3 x 30 sec for R quadriceps Prone alternating hip extension; 2 x 10 alt R/L     Neuromuscular Re-education - for nervous system downregulation, gluteal musculature activation and exercises to promote LE kinetic chain stability  Bridge with alternating knee extension; 2 x 6 alt R/L  Minisquat with 5-sec isometric at bottom of ROM; 2 x 10  -reviewed ROM modification  (minisquat versus half squat) and reviewed technique for squat  Forward step up to hurdle stance; 2 x 10, 6-inch step  TRX reverse lunge; 2 x 10  -verbal  cueing and demo to limit dynamic valgus  TRX alternating lateral lunge; 2 x 10  SLS on floor; 3 x 20 sec  3-way slider; forward, lateral, reverse; L foot on slide, stabilizing on RLE x 8 through 3-way cycle       *not today* Monster walk; Blue Tband just above patellae; 3x D/B length of // bars  -catching pain along contralateral (L) knee with successive steps, stopped at 3 reps due to this STM and IASTM with Theraband roller along R quads; x 6 minutes Bridge with Black Tband; 2 x 10, 3-sec isometric 3-way touch with isometric minisquat, Red Tband around ankles; x 8 ea dir, bilat  Minisquat with 5-sec isometric; 2 x 8, 5 sec hold at bottom SLS on Airex; 2 x 20 sec with intermittent light touch Lateral step-up, 6-inch step; 2 x 10   PATIENT EDUCATION:  Education details: see above for patient education details Person educated: Patient Education method: Explanation, Demonstration, and Handouts Education comprehension: verbalized understanding and returned demonstration   HOME EXERCISE PROGRAM:  Access Code: [email or web address] URL: [email or web address] Date: 04/14/2024 Prepared by: Venetia Endo  Exercises - Prone Quadriceps Stretch with Strap  - 2 x daily - 7 x weekly - 3 sets - 30sec hold - Active Straight Leg Raise with Quad Set  - 2 x daily - 7 x weekly - 2 sets - 10 reps - Sidelying Hip Abduction  - 2 x daily - 7 x weekly - 2 sets - 10 reps - Standing Double Leg Mini Squat  - 2 x daily - 7 x weekly - 2 sets - 10 reps - 5sec hold - Single Leg Stance  - 2 x daily - 7 x weekly - 3 sets - 30sec hold - Supine Bridge with Resistance Band  - 2 x daily - 5-7 x weekly - 2 sets - 10 reps - Standing 3-Way Leg Reach with Resistance at Ankles and Unilateral Counter Support  - 2 x daily - 5-7 x weekly - 2 sets - 10  reps   ASSESSMENT:  CLINICAL IMPRESSION: Patient has comorbid L knee OA and pain limiting tolerance of some bilat LE CKC movements. She is able to demonstrate minisquat in limited ROM today with fair tolerance - we discussed technique and shifting hips backwards versus moving into excessive anterior tibial translation during double-limb squat. We held on monster walk today due to catching pain in L knee last visit. R knee s/p TKA just over one year ago has tolerated most additional CKC drills relatively well. Ongoing functional limitations and pain with longer-duration weightbearing activity will need to be discussed with orthopedist if this does not improve with PT. Pt has current deficits in: R quad/hip/gluteal weakness, quadriceps tightness, moderate R knee hypermobile TF joint, TTP R patellar tendon, and dynamic valgus movement fault. Pt will continue to benefit from skilled PT services to address deficits and improve function.  OBJECTIVE IMPAIRMENTS: Abnormal gait, difficulty walking, decreased ROM, decreased strength, hypomobility, impaired flexibility, and pain.   ACTIVITY LIMITATIONS: lifting, bending, sitting, standing, squatting, stairs, transfers, bed mobility, and locomotion level  PARTICIPATION LIMITATIONS: cleaning, driving, shopping, community activity, and yard work  PERSONAL FACTORS: Age, Past/current experiences, Time since onset of injury/illness/exacerbation, and 1-2 comorbidities: (Hx of breast cancer, OA) are also affecting patient's functional outcome.   REHAB POTENTIAL: Fair given length of time from Sx and chronicity  CLINICAL DECISION MAKING: Evolving/moderate complexity  EVALUATION COMPLEXITY: Moderate   GOALS: Goals reviewed with patient? Yes  SHORT TERM GOALS:  Target date: 04/24/2024  Pt will be independent with HEP to improve strength and decrease knee pain to improve pain-free function at home and work. Baseline: 03/31/24: Baseline HEP initiated Goal  status: INITIAL   LONG TERM GOALS: Target date: 05/15/2024  Pt will demonstrate pain-free sit to stand from standard-height chair without dynamic valgus compensation indicative of improved functional LE strength.  Baseline: 03/31/24: Pain with sit to stand, dynamic valgus movement fault. Goal status: INITIAL  2.  Pt will decrease worst knee pain by at least 3 points on the NPRS in order to demonstrate clinically significant reduction in knee pain. Baseline: 03/31/24: 8-9/10 at worst.  Goal status: INITIAL  3.  Pt will decrease LEFS score by at least 9 points in order demonstrate clinically significant reduction in knee pain/disability.       Baseline: 03/31/24: 63.75% Goal status: INITIAL  4.  Pt will increase strength of R hip and knee tested musculature to at least 4+/5 or greater MMT grade in order to demonstrate improvement in strength and function  Baseline: 03/31/24: 4- to 4/5 (see chart above). Goal status: INITIAL   PLAN: PT FREQUENCY: 1-2x/week  PT DURATION: 6 weeks  PLANNED INTERVENTIONS: Therapeutic exercises, Therapeutic activity, Neuromuscular re-education, Balance training, Gait training, Patient/Family education, Self Care, Joint mobilization, Joint manipulation, Vestibular training, Canalith repositioning, Orthotic/Fit training, DME instructions, Dry Needling, Electrical stimulation, Spinal manipulation, Spinal mobilization, Cryotherapy, Moist heat, Taping, Traction, Ultrasound, Ionotophoresis 4mg /ml Dexamethasone , Manual therapy, and Re-evaluation.  PLAN FOR NEXT SESSION: Quad flexibility, quad and gluteal strengthening, isometric exercise to promote stability and progressive CKC stabilization drills.    Venetia Endo, PT, DPT #[id number]  Venetia ONEIDA Endo, PT 04/16/2024, 8:58 AM

## 2024-04-18 NOTE — Therapy (Signed)
 OUTPATIENT PHYSICAL THERAPY KNEE TREATMENT  Patient Name: KORINA TRETTER MRN: [id number] DOB:08/23/1955, 68 y.o., female Today's Date: 04/21/2024  END OF SESSION:  PT End of Session - 04/21/24 0805     Visit Number 7    Number of Visits 13    Date for Recertification  05/15/24    PT Start Time 0815    PT Stop Time 0856    PT Time Calculation (min) 41 min    Activity Tolerance Patient tolerated treatment well    Behavior During Therapy WFL for tasks assessed/performed            Past Medical History:  Diagnosis Date   Arthritis    KNEES   Cancer (HCC)    Complication of anesthesia    PONV (postoperative nausea and vomiting)    PT STATES SCOPALAMINE PATCH HELPED WITH HER LAST SURGERY   Past Surgical History:  Procedure Laterality Date   ABDOMINAL HYSTERECTOMY  06/12/1988   BREAST LUMPECTOMY WITH SENTINEL LYMPH NODE BIOPSY Right 10/14/2015   Procedure: BREAST LUMPECTOMY WITH SENTINEL LYMPH NODE BX;  Surgeon: Louanne KANDICE Muse, MD;  Location: [facility];  Service: General;  Laterality: Right;   BREAST SURGERY     CATARACT EXTRACTION W/PHACO Right 01/23/2018   Procedure: CATARACT EXTRACTION PHACO AND INTRAOCULAR LENS PLACEMENT (IOC)  RIGHT;  Surgeon: Mittie Gaskin, MD;  Location: [facility];  Service: Ophthalmology;  Laterality: Right;   CATARACT EXTRACTION W/PHACO Left 02/13/2018   Procedure: CATARACT EXTRACTION PHACO AND INTRAOCULAR LENS PLACEMENT (IOC) LEFT;  Surgeon: Mittie Gaskin, MD;  Location: [facility];  Service: Ophthalmology;  Laterality: Left;   COLONOSCOPY     EYE SURGERY     JOINT REPLACEMENT     KNEE ARTHROSCOPY     REPLACEMENT TOTAL KNEE Right    TUBAL LIGATION     Patient Active Problem List   Diagnosis Date Noted   Degeneration of lumbar intervertebral disc 10/08/2023   Irritable bowel syndrome 05/23/2023   Vaccine refused by patient 12/01/2022   Generalized osteoarthritis of multiple sites 11/03/2022   History of  breast cancer in female 11/03/2022   Osteopenia of neck of femur 11/03/2022   History of anemia 11/03/2022   Multilevel degenerative disc disease 10/17/2021   Osteoarthritis of knee 10/17/2021   Primary localized osteoarthritis of pelvic region and thigh 10/17/2021    PCP: Manya Toribio SQUIBB, PA  REFERRING PROVIDER: Beverley Evalene BIRCH, MD  REFERRING DIAG: [phone] (ICD-10-CM) - Presence of right artificial knee joint   RATIONALE FOR EVALUATION AND TREATMENT: Rehabilitation  THERAPY DIAG: Right knee pain, unspecified chronicity  Muscle weakness (generalized)  Stiffness of right knee, not elsewhere classified  Acute pain of right knee  Difficulty in walking, not elsewhere classified  ONSET DATE: R TKA, DOS 04/05/23  FOLLOW-UP APPT SCHEDULED WITH REFERRING PROVIDER: None scheduled at this time  PERTINENT HISTORY: Pt is a 68 year old female known to this clinic; Hx of R TKA 04/05/23. Pt arrives today with new PT referral for R knee.   Pt currently reports R knee pain after weightbearing for prolonged periods e.g. with mowing back yard. Patient reports pain is vague and she sometimes has discomfort affecting foot/ankle. She reports tightness along medial calf. She tries to be active, but she reports paying for it after higher volume of activity in yard e.g. raking. Pt states pain is tolerable first thing in AM, but when she's weightbearing for prolonged period, she has increase in pain. Pt reports heaviness and pain from  hips down to knees intermittently when rolling over in bed.   PAIN:   Pain Intensity: Present: 0/10, Best: 0/10, Worst: 8-9/10 Pain location: Vaguely around anterior knee and anterior leg pain  Pain quality: tight presently; strong dull pain  Radiating pain: Yes ; down anterior leg Swelling: No, swelling not noted per patient  Popping, catching, locking: Intermittent popping only  Numbness/Tingling: Yes; anterior knee and into her feet  Focal weakness or  buckling: Yes; occasional buckling, may give a little bit Aggravating factors: prolonged weightbearing, prolonged standing, prolonged sitting, sit to stand (after prolonged sitting especially) Relieving factors: lying down, rest 24-hour pain behavior: worse later in day with activity  How long can you sit: 1.5 hr  How long can you stand: No timeframe stated  History of prior back, hip, or knee injury, pain, surgery, or therapy: Yes; R TKA 04/05/23, R hip pain that improved s/p cortisone injection;   Imaging: Yes ; pt secondary report - no hardware misalignment or mechanical issues on radiographs Prior level of function: Independent Occupational demands: Part-time job; helping business with bookkeeping  Hobbies: Crafting Red flags: Negative for chills/fever, night sweats, nausea, vomiting, unexplained weight gain/loss, unrelenting pain   -hx of cancer, in remission 6 years   PRECAUTIONS: None  WEIGHT BEARING RESTRICTIONS: No  FALLS: Has patient fallen in last 6 months? Single fall in spring when dog charged her; pt had X-rays after this to rule out hardware issue for R knee (early Spring 2025)  Living Environment Lives with: lives with their spouse, son lives an hour away  Lives in: House/apartment 2 steps to get into home - step down into family room (2 steps) - no handrail for home entrance; home is one level. Gravel to walk into home; concrete sidewalk to get to/from car.   Patient Goals: Improved pain for RLE/R knee    OBJECTIVE (data from initial evaluation unless otherwise dated):   Patient Surveys  LEFS  Extreme difficulty/unable (0), Quite a bit of difficulty (1), Moderate difficulty (2), Little difficulty (3), No difficulty (4) Survey date:   03/31/24  Any of your usual work, housework or school activities 2  2. Usual hobbies, recreational or sporting activities 2  3. Getting into/out of the bath 0  4. Walking between rooms 4  5. Putting on socks/shoes 3  6.  Squatting  1  7. Lifting an object, like a bag of groceries from the floor 4  8. Performing light activities around your home 4  9. Performing heavy activities around your home 3  10. Getting into/out of a car 4  11. Walking 2 blocks 3  12. Walking 1 mile 3  13. Going up/down 10 stairs (1 flight) 4  14. Standing for 1 hour 3  15.  sitting for 1 hour 2  16. Running on even ground 2  17. Running on uneven ground 2  18. Making sharp turns while running fast 2  19. Hopping  2  20. Rolling over in bed 1  Score total:  51/80 = 63.75%      Gross Musculoskeletal Assessment Tremor: None Bulk: R quadriceps atrophy  Tone: Normal  GAIT: Distance walked: 80 ft Assistive device utilized: None Level of assistance: Complete Independence Comments: Dec stance time on RLE, R genu valgum with loading response (moderate)  Attempted squat, knees ahead of toes, dec weightbearing to heels  Posture: Moderate FHRS, R>L mild genu valgum  AROM AROM (Normal range in degrees) AROM 03/31/24  Hip Right Left  Flexion (125) WNL   Extension (15)    Abduction (40)    Adduction     Internal Rotation (45) WNL   External Rotation (45) WNL       Knee    Flexion (135) 124   Extension (0) +9       Ankle    Dorsiflexion (20) 15   Plantarflexion (50)    Inversion (35)    Eversion (15    (* = pain; Blank rows = not tested)  LE MMT: MMT (out of 5) Right 03/31/24 Left 03/31/24  Hip flexion 4- 4  Hip extension 4 4  Hip abduction 4-   Hip adduction    Hip internal rotation    Hip external rotation    Knee flexion 4- 5  Knee extension 4 5-  Ankle dorsiflexion 5 5  Ankle plantarflexion    Ankle inversion    Ankle eversion    (* = pain; Blank rows = not tested)  Sensation Deferred  Reflexes Deferred  Muscle Length Hamstrings: R: Negative L: Negative Quadriceps ([place]): R: Positive L: Positive   Palpation Location LEFT  RIGHT           Quadriceps  0  Medial Hamstrings  1  Lateral  Hamstrings  1  Lateral Hamstring tendon  1  Medial Hamstring tendon  1  Quadriceps tendon  0  Patella    Patellar Tendon  2  Tibial Tuberosity  2  Medial joint line  0  Lateral joint line  0  MCL  1  LCL  1  Adductor Tubercle    Pes Anserine tendon    Infrapatellar fat pad  0  Fibular head  1  Popliteal fossa    (Blank rows = not tested) Graded on 0-4 scale (0 = no pain, 1 = pain, 2 = pain with wincing/grimacing/flinching, 3 = pain with withdrawal, 4 = unwilling to allow palpation), (Blank rows = not tested)  Passive Accessory Motion Patellofemoral: Superior Glide: R: Negative L: Negative Inferior Glide: R: Negative L: Negative Medial Glide: R: Negative L: Negative Lateral Glide: R: Negative L: Negative    VASCULAR Deferred   SPECIAL TESTS  Patellar Tendinopathy Inferior pole palpation with anterior tilt: R: Positive L: Not examined      TODAY'S TREATMENT   04/21/2024:   SUBJECTIVE STATEMENT:   Patient feels a grinding sensation in her L knee this AM. Continues to feel like she is not improving with PT this go round.    Not today: Therapeutic Exercise - for improved soft tissue flexibility and extensibility as needed for ROM, improved strength as needed to improve performance of CKC activities/functional movements  SLR; 2 x 10, 4-lb ankle weight   Pt edu: HEP reviewed.    *not today* Passive prone quad stretch; 3 x 30 sec for R quadriceps Prone alternating hip extension; 2 x 10 alt R/L   Therapeutic Activity:  NuStep; Level 5, x 6 minutes - for improved soft tissue mobility and increased tissue temperature to improve muscle performance   -subjective gathered during this time Standing marching x 30 with single UE support with 3# AW each  Standing hip abduction 3 x 10 with B UE support with 3# AW each  Standing hip extension 3 x 10 with BUE support with 3# AW each   Sit to stand x 10 with no UE support - pain in B knees (grinding) TRX mini squats 3 x 10  (within pain tolerance)  Fwd step downs  6 step 2 x 10 leading with each LE   Tandem with one foot on blue airex and second foot on 6 step 2 x 30 seconds leading with each LE and no UE support   Lateral step over 6 hurdle 2 x 10 each direction - no UE support    *not today* Monster walk; Blue Tband just above patellae; 3x D/B length of // bars  -catching pain along contralateral (L) knee with successive steps, stopped at 3 reps due to this STM and IASTM with Theraband roller along R quads; x 6 minutes Bridge with Black Tband; 2 x 10, 3-sec isometric 3-way touch with isometric minisquat, Red Tband around ankles; x 8 ea dir, bilat  Minisquat with 5-sec isometric; 2 x 8, 5 sec hold at bottom SLS on Airex; 2 x 20 sec with intermittent light touch Lateral step-up, 6-inch step; 2 x 10   PATIENT EDUCATION:  Education details: see above for patient education details Person educated: Patient Education method: Explanation, Demonstration, and Handouts Education comprehension: verbalized understanding and returned demonstration   HOME EXERCISE PROGRAM:  Access Code: [email or web address] URL: [email or web address] Date: 04/14/2024 Prepared by: Venetia Endo  Exercises - Prone Quadriceps Stretch with Strap  - 2 x daily - 7 x weekly - 3 sets - 30sec hold - Active Straight Leg Raise with Quad Set  - 2 x daily - 7 x weekly - 2 sets - 10 reps - Sidelying Hip Abduction  - 2 x daily - 7 x weekly - 2 sets - 10 reps - Standing Double Leg Mini Squat  - 2 x daily - 7 x weekly - 2 sets - 10 reps - 5sec hold - Single Leg Stance  - 2 x daily - 7 x weekly - 3 sets - 30sec hold - Supine Bridge with Resistance Band  - 2 x daily - 5-7 x weekly - 2 sets - 10 reps - Standing 3-Way Leg Reach with Resistance at Ankles and Unilateral Counter Support  - 2 x daily - 5-7 x weekly - 2 sets - 10 reps   ASSESSMENT:  CLINICAL IMPRESSION:  Patient has comorbid L knee OA and pain limiting tolerance of some  bilat LE CKC movements. Session focused on hip and quad strengthening as well as static and dynamic balance. Tolerated session well with no increase in pain compared to pain she had coming into session. Continues to have difficulty with sit to stands due to L knee pain. Pt will continue to benefit from skilled PT services to address deficits and improve function.  OBJECTIVE IMPAIRMENTS: Abnormal gait, difficulty walking, decreased ROM, decreased strength, hypomobility, impaired flexibility, and pain.   ACTIVITY LIMITATIONS: lifting, bending, sitting, standing, squatting, stairs, transfers, bed mobility, and locomotion level  PARTICIPATION LIMITATIONS: cleaning, driving, shopping, community activity, and yard work  PERSONAL FACTORS: Age, Past/current experiences, Time since onset of injury/illness/exacerbation, and 1-2 comorbidities: (Hx of breast cancer, OA) are also affecting patient's functional outcome.   REHAB POTENTIAL: Fair given length of time from Sx and chronicity  CLINICAL DECISION MAKING: Evolving/moderate complexity  EVALUATION COMPLEXITY: Moderate   GOALS: Goals reviewed with patient? Yes  SHORT TERM GOALS: Target date: 04/24/2024  Pt will be independent with HEP to improve strength and decrease knee pain to improve pain-free function at home and work. Baseline: 03/31/24: Baseline HEP initiated Goal status: INITIAL   LONG TERM GOALS: Target date: 05/15/2024  Pt will demonstrate pain-free sit to stand from standard-height chair without dynamic valgus compensation indicative of improved  functional LE strength.  Baseline: 03/31/24: Pain with sit to stand, dynamic valgus movement fault. Goal status: INITIAL  2.  Pt will decrease worst knee pain by at least 3 points on the NPRS in order to demonstrate clinically significant reduction in knee pain. Baseline: 03/31/24: 8-9/10 at worst.  Goal status: INITIAL  3.  Pt will decrease LEFS score by at least 9 points in order  demonstrate clinically significant reduction in knee pain/disability.       Baseline: 03/31/24: 63.75% Goal status: INITIAL  4.  Pt will increase strength of R hip and knee tested musculature to at least 4+/5 or greater MMT grade in order to demonstrate improvement in strength and function  Baseline: 03/31/24: 4- to 4/5 (see chart above). Goal status: INITIAL   PLAN: PT FREQUENCY: 1-2x/week  PT DURATION: 6 weeks  PLANNED INTERVENTIONS: Therapeutic exercises, Therapeutic activity, Neuromuscular re-education, Balance training, Gait training, Patient/Family education, Self Care, Joint mobilization, Joint manipulation, Vestibular training, Canalith repositioning, Orthotic/Fit training, DME instructions, Dry Needling, Electrical stimulation, Spinal manipulation, Spinal mobilization, Cryotherapy, Moist heat, Taping, Traction, Ultrasound, Ionotophoresis 4mg /ml Dexamethasone , Manual therapy, and Re-evaluation.  PLAN FOR NEXT SESSION: Quad flexibility, quad and gluteal strengthening, isometric exercise to promote stability and progressive CKC stabilization drills.    Maryanne Finder, PT, DPT Physical Therapist - [place]  [facility] 04/21/2024, 8:09 AM

## 2024-04-21 ENCOUNTER — Ambulatory Visit

## 2024-04-21 ENCOUNTER — Encounter: Payer: Self-pay | Admitting: Physical Therapy

## 2024-04-21 DIAGNOSIS — M25561 Pain in right knee: Secondary | ICD-10-CM | POA: Diagnosis not present

## 2024-04-21 DIAGNOSIS — M6281 Muscle weakness (generalized): Secondary | ICD-10-CM

## 2024-04-21 DIAGNOSIS — R262 Difficulty in walking, not elsewhere classified: Secondary | ICD-10-CM

## 2024-04-21 DIAGNOSIS — M25661 Stiffness of right knee, not elsewhere classified: Secondary | ICD-10-CM

## 2024-04-23 ENCOUNTER — Ambulatory Visit: Admitting: Physical Therapy

## 2024-04-23 ENCOUNTER — Encounter: Payer: Self-pay | Admitting: Physical Therapy

## 2024-04-23 DIAGNOSIS — M25661 Stiffness of right knee, not elsewhere classified: Secondary | ICD-10-CM

## 2024-04-23 DIAGNOSIS — M25561 Pain in right knee: Secondary | ICD-10-CM | POA: Diagnosis not present

## 2024-04-23 DIAGNOSIS — M6281 Muscle weakness (generalized): Secondary | ICD-10-CM

## 2024-04-23 NOTE — Therapy (Signed)
 OUTPATIENT PHYSICAL THERAPY KNEE TREATMENT  Patient Name: Colleen Price MRN: [id number] DOB:06/27/1955, 68 y.o., female Today's Date: 04/23/2024  END OF SESSION:  PT End of Session - 04/23/24 0806     Visit Number 8    Number of Visits 13    Date for Recertification  05/15/24    PT Start Time 0821    PT Stop Time 0901    PT Time Calculation (min) 40 min    Activity Tolerance Patient tolerated treatment well    Behavior During Therapy WFL for tasks assessed/performed           Past Medical History:  Diagnosis Date   Arthritis    KNEES   Cancer (HCC)    Complication of anesthesia    PONV (postoperative nausea and vomiting)    PT STATES SCOPALAMINE PATCH HELPED WITH HER LAST SURGERY   Past Surgical History:  Procedure Laterality Date   ABDOMINAL HYSTERECTOMY  06/12/1988   BREAST LUMPECTOMY WITH SENTINEL LYMPH NODE BIOPSY Right 10/14/2015   Procedure: BREAST LUMPECTOMY WITH SENTINEL LYMPH NODE BX;  Surgeon: Louanne KANDICE Muse, MD;  Location: [facility];  Service: General;  Laterality: Right;   BREAST SURGERY     CATARACT EXTRACTION W/PHACO Right 01/23/2018   Procedure: CATARACT EXTRACTION PHACO AND INTRAOCULAR LENS PLACEMENT (IOC)  RIGHT;  Surgeon: Mittie Gaskin, MD;  Location: [facility];  Service: Ophthalmology;  Laterality: Right;   CATARACT EXTRACTION W/PHACO Left 02/13/2018   Procedure: CATARACT EXTRACTION PHACO AND INTRAOCULAR LENS PLACEMENT (IOC) LEFT;  Surgeon: Mittie Gaskin, MD;  Location: [facility];  Service: Ophthalmology;  Laterality: Left;   COLONOSCOPY     EYE SURGERY     JOINT REPLACEMENT     KNEE ARTHROSCOPY     REPLACEMENT TOTAL KNEE Right    TUBAL LIGATION     Patient Active Problem List   Diagnosis Date Noted   Degeneration of lumbar intervertebral disc 10/08/2023   Irritable bowel syndrome 05/23/2023   Vaccine refused by patient 12/01/2022   Generalized osteoarthritis of multiple sites 11/03/2022   History of  breast cancer in female 11/03/2022   Osteopenia of neck of femur 11/03/2022   History of anemia 11/03/2022   Multilevel degenerative disc disease 10/17/2021   Osteoarthritis of knee 10/17/2021   Primary localized osteoarthritis of pelvic region and thigh 10/17/2021    PCP: Manya Toribio SQUIBB, PA  REFERRING PROVIDER: Beverley Evalene BIRCH, MD  REFERRING DIAG: [phone] (ICD-10-CM) - Presence of right artificial knee joint   RATIONALE FOR EVALUATION AND TREATMENT: Rehabilitation  THERAPY DIAG: Right knee pain, unspecified chronicity  Muscle weakness (generalized)  Stiffness of right knee, not elsewhere classified  ONSET DATE: R TKA, DOS 04/05/23  FOLLOW-UP APPT SCHEDULED WITH REFERRING PROVIDER: None scheduled at this time  PERTINENT HISTORY: Pt is a 68 year old female known to this clinic; Hx of R TKA 04/05/23. Pt arrives today with new PT referral for R knee.   Pt currently reports R knee pain after weightbearing for prolonged periods e.g. with mowing back yard. Patient reports pain is vague and she sometimes has discomfort affecting foot/ankle. She reports tightness along medial calf. She tries to be active, but she reports paying for it after higher volume of activity in yard e.g. raking. Pt states pain is tolerable first thing in AM, but when she's weightbearing for prolonged period, she has increase in pain. Pt reports heaviness and pain from hips down to knees intermittently when rolling over in bed.   PAIN:  Pain Intensity: Present: 0/10, Best: 0/10, Worst: 8-9/10 Pain location: Vaguely around anterior knee and anterior leg pain  Pain quality: tight presently; strong dull pain  Radiating pain: Yes ; down anterior leg Swelling: No, swelling not noted per patient  Popping, catching, locking: Intermittent popping only  Numbness/Tingling: Yes; anterior knee and into her feet  Focal weakness or buckling: Yes; occasional buckling, may give a little bit Aggravating factors:  prolonged weightbearing, prolonged standing, prolonged sitting, sit to stand (after prolonged sitting especially) Relieving factors: lying down, rest 24-hour pain behavior: worse later in day with activity  How long can you sit: 1.5 hr  How long can you stand: No timeframe stated  History of prior back, hip, or knee injury, pain, surgery, or therapy: Yes; R TKA 04/05/23, R hip pain that improved s/p cortisone injection;   Imaging: Yes ; pt secondary report - no hardware misalignment or mechanical issues on radiographs Prior level of function: Independent Occupational demands: Part-time job; helping business with bookkeeping  Hobbies: Crafting Red flags: Negative for chills/fever, night sweats, nausea, vomiting, unexplained weight gain/loss, unrelenting pain   -hx of cancer, in remission 6 years   PRECAUTIONS: None  WEIGHT BEARING RESTRICTIONS: No  FALLS: Has patient fallen in last 6 months? Single fall in spring when dog charged her; pt had X-rays after this to rule out hardware issue for R knee (early Spring 2025)  Living Environment Lives with: lives with their spouse, son lives an hour away  Lives in: House/apartment 2 steps to get into home - step down into family room (2 steps) - no handrail for home entrance; home is one level. Gravel to walk into home; concrete sidewalk to get to/from car.   Patient Goals: Improved pain for RLE/R knee    OBJECTIVE (data from initial evaluation unless otherwise dated):   Patient Surveys  LEFS  Extreme difficulty/unable (0), Quite a bit of difficulty (1), Moderate difficulty (2), Little difficulty (3), No difficulty (4) Survey date:   03/31/24  Any of your usual work, housework or school activities 2  2. Usual hobbies, recreational or sporting activities 2  3. Getting into/out of the bath 0  4. Walking between rooms 4  5. Putting on socks/shoes 3  6. Squatting  1  7. Lifting an object, like a bag of groceries from the floor 4  8.  Performing light activities around your home 4  9. Performing heavy activities around your home 3  10. Getting into/out of a car 4  11. Walking 2 blocks 3  12. Walking 1 mile 3  13. Going up/down 10 stairs (1 flight) 4  14. Standing for 1 hour 3  15.  sitting for 1 hour 2  16. Running on even ground 2  17. Running on uneven ground 2  18. Making sharp turns while running fast 2  19. Hopping  2  20. Rolling over in bed 1  Score total:  51/80 = 63.75%      Gross Musculoskeletal Assessment Tremor: None Bulk: R quadriceps atrophy  Tone: Normal  GAIT: Distance walked: 80 ft Assistive device utilized: None Level of assistance: Complete Independence Comments: Dec stance time on RLE, R genu valgum with loading response (moderate)  Attempted squat, knees ahead of toes, dec weightbearing to heels  Posture: Moderate FHRS, R>L mild genu valgum  AROM AROM (Normal range in degrees) AROM 03/31/24  Hip Right Left  Flexion (125) WNL   Extension (15)    Abduction (40)  Adduction     Internal Rotation (45) WNL   External Rotation (45) WNL       Knee    Flexion (135) 124   Extension (0) +9       Ankle    Dorsiflexion (20) 15   Plantarflexion (50)    Inversion (35)    Eversion (15    (* = pain; Blank rows = not tested)  LE MMT: MMT (out of 5) Right 03/31/24 Left 03/31/24  Hip flexion 4- 4  Hip extension 4 4  Hip abduction 4-   Hip adduction    Hip internal rotation    Hip external rotation    Knee flexion 4- 5  Knee extension 4 5-  Ankle dorsiflexion 5 5  Ankle plantarflexion    Ankle inversion    Ankle eversion    (* = pain; Blank rows = not tested)  Sensation Deferred  Reflexes Deferred  Muscle Length Hamstrings: R: Negative L: Negative Quadriceps ([place]): R: Positive L: Positive   Palpation Location LEFT  RIGHT           Quadriceps  0  Medial Hamstrings  1  Lateral Hamstrings  1  Lateral Hamstring tendon  1  Medial Hamstring tendon  1  Quadriceps  tendon  0  Patella    Patellar Tendon  2  Tibial Tuberosity  2  Medial joint line  0  Lateral joint line  0  MCL  1  LCL  1  Adductor Tubercle    Pes Anserine tendon    Infrapatellar fat pad  0  Fibular head  1  Popliteal fossa    (Blank rows = not tested) Graded on 0-4 scale (0 = no pain, 1 = pain, 2 = pain with wincing/grimacing/flinching, 3 = pain with withdrawal, 4 = unwilling to allow palpation), (Blank rows = not tested)  Passive Accessory Motion Patellofemoral: Superior Glide: R: Negative L: Negative Inferior Glide: R: Negative L: Negative Medial Glide: R: Negative L: Negative Lateral Glide: R: Negative L: Negative    VASCULAR Deferred   SPECIAL TESTS  Patellar Tendinopathy Inferior pole palpation with anterior tilt: R: Positive L: Not examined      TODAY'S TREATMENT   04/23/2024:   SUBJECTIVE STATEMENT:   Patient reports having some grinding sensation in L knee; states it is not sharp pain. Patient reports R knee is feeling okay this AM. Pt reports R knee was doing better last night, but she was also not doing notable strenuous activity. Patient reports intermittent non-compliance with HEP with rushing out of the door in AM.    Therapeutic Exercise - for improved soft tissue flexibility and extensibility as needed for ROM, improved strength as needed to improve performance of CKC activities/functional movements  SLR; 2 x 10, 5-lb ankle weight   Bridge with BOSU ball; 2 x 10, 5 sec isometric hold   Pt edu: Discussed role of PT and potential next steps in event of failed outpatient rehab.    *not today* Passive prone quad stretch; 3 x 30 sec for R quadriceps Prone alternating hip extension; 2 x 10 alt R/L   Therapeutic Activity:  SciFit x 5 minutes - Level 8 - for improved soft tissue mobility and increased tissue temperature to improve muscle performance   -subjective gathered during this time  Sit to stand; 1 x 10 with no UE support, Blue Tband  for hip ABD isometric Sit to stand, chair + Airex for increased seat height; 1 x 10  with no UE support, Blue Tband for hip ABD isometric  TRX reverse lunge; 2 x 10, bilat  Lateral stepdown, 6-inch step; 2 x 10 (standing on RLE today)  Tandem standing on long Airex; 1 x 30 sec   Single limb stance; x 30 sec, intermittent light touch  Total Gym single-limb minisquat; on RLE; 2 x 10    *not today* Lateral step over 6 hurdle 2 x 10 each direction - no UE support  TRX mini squats 3 x 10 (within pain tolerance) Standing marching x 30 with single UE support with 3# AW each  Standing hip abduction 3 x 10 with B UE support with 3# AW each  Standing hip extension 3 x 10 with BUE support with 3# AW each  Fwd step downs 6 step 2 x 10 leading with each LE  Monster walk; Blue Tband just above patellae; 3x D/B length of // bars  -catching pain along contralateral (L) knee with successive steps, stopped at 3 reps due to this STM and IASTM with Theraband roller along R quads; x 6 minutes Bridge with Black Tband; 2 x 10, 3-sec isometric 3-way touch with isometric minisquat, Red Tband around ankles; x 8 ea dir, bilat  Minisquat with 5-sec isometric; 2 x 8, 5 sec hold at bottom SLS on Airex; 2 x 20 sec with intermittent light touch Lateral step-up, 6-inch step; 2 x 10   PATIENT EDUCATION:  Education details: see above for patient education details Person educated: Patient Education method: Explanation, Demonstration, and Handouts Education comprehension: verbalized understanding and returned demonstration   HOME EXERCISE PROGRAM:  Access Code: [email or web address] URL: [email or web address] Date: 04/14/2024 Prepared by: Venetia Endo  Exercises - Prone Quadriceps Stretch with Strap  - 2 x daily - 7 x weekly - 3 sets - 30sec hold - Active Straight Leg Raise with Quad Set  - 2 x daily - 7 x weekly - 2 sets - 10 reps - Sidelying Hip Abduction  - 2 x daily - 7 x weekly - 2 sets - 10  reps - Standing Double Leg Mini Squat  - 2 x daily - 7 x weekly - 2 sets - 10 reps - 5sec hold - Single Leg Stance  - 2 x daily - 7 x weekly - 3 sets - 30sec hold - Supine Bridge with Resistance Band  - 2 x daily - 5-7 x weekly - 2 sets - 10 reps - Standing 3-Way Leg Reach with Resistance at Ankles and Unilateral Counter Support  - 2 x daily - 5-7 x weekly - 2 sets - 10 reps   ASSESSMENT:  CLINICAL IMPRESSION:  Patient is limited by comorbid L knee pain with Hx of OA. She reports ongoing limitations with prolonged standing/ambulatory activities on RLE with c/o R knee pain following TKA just over 1 year ago. We continued with progression of stabilization exercise, quad and hip strengthening, and CKC drills in tolerated ROM. Pt does report concern with ongoing issues given significant time for post-op healing following R TKA. True improvements in strength and performance may take weeks, but pt will need to be referred to orthopedist if progress is not obtained over 4-6 weeks of conservative care.  Pt will continue to benefit from skilled PT services to address deficits and improve function.  OBJECTIVE IMPAIRMENTS: Abnormal gait, difficulty walking, decreased ROM, decreased strength, hypomobility, impaired flexibility, and pain.   ACTIVITY LIMITATIONS: lifting, bending, sitting, standing, squatting, stairs, transfers, bed mobility, and locomotion level  PARTICIPATION LIMITATIONS: cleaning,  driving, shopping, community activity, and yard work  PERSONAL FACTORS: Age, Past/current experiences, Time since onset of injury/illness/exacerbation, and 1-2 comorbidities: (Hx of breast cancer, OA) are also affecting patient's functional outcome.   REHAB POTENTIAL: Fair given length of time from Sx and chronicity  CLINICAL DECISION MAKING: Evolving/moderate complexity  EVALUATION COMPLEXITY: Moderate   GOALS: Goals reviewed with patient? Yes  SHORT TERM GOALS: Target date: 04/24/2024  Pt will be  independent with HEP to improve strength and decrease knee pain to improve pain-free function at home and work. Baseline: 03/31/24: Baseline HEP initiated Goal status: INITIAL   LONG TERM GOALS: Target date: 05/15/2024  Pt will demonstrate pain-free sit to stand from standard-height chair without dynamic valgus compensation indicative of improved functional LE strength.  Baseline: 03/31/24: Pain with sit to stand, dynamic valgus movement fault. Goal status: INITIAL  2.  Pt will decrease worst knee pain by at least 3 points on the NPRS in order to demonstrate clinically significant reduction in knee pain. Baseline: 03/31/24: 8-9/10 at worst.  Goal status: INITIAL  3.  Pt will decrease LEFS score by at least 9 points in order demonstrate clinically significant reduction in knee pain/disability.       Baseline: 03/31/24: 63.75% Goal status: INITIAL  4.  Pt will increase strength of R hip and knee tested musculature to at least 4+/5 or greater MMT grade in order to demonstrate improvement in strength and function  Baseline: 03/31/24: 4- to 4/5 (see chart above). Goal status: INITIAL   PLAN: PT FREQUENCY: 1-2x/week  PT DURATION: 6 weeks  PLANNED INTERVENTIONS: Therapeutic exercises, Therapeutic activity, Neuromuscular re-education, Balance training, Gait training, Patient/Family education, Self Care, Joint mobilization, Joint manipulation, Vestibular training, Canalith repositioning, Orthotic/Fit training, DME instructions, Dry Needling, Electrical stimulation, Spinal manipulation, Spinal mobilization, Cryotherapy, Moist heat, Taping, Traction, Ultrasound, Ionotophoresis 4mg /ml Dexamethasone , Manual therapy, and Re-evaluation.  PLAN FOR NEXT SESSION: Quad flexibility, quad and gluteal strengthening, isometric exercise to promote stability and progressive CKC stabilization drills.    Venetia Endo, PT, DPT [phone] Physical Therapist - [facility] 04/23/2024, 8:21 AM

## 2024-04-28 ENCOUNTER — Encounter: Payer: Self-pay | Admitting: Physical Therapy

## 2024-04-28 ENCOUNTER — Ambulatory Visit: Admitting: Physical Therapy

## 2024-04-28 DIAGNOSIS — M25661 Stiffness of right knee, not elsewhere classified: Secondary | ICD-10-CM

## 2024-04-28 DIAGNOSIS — M25561 Pain in right knee: Secondary | ICD-10-CM | POA: Diagnosis not present

## 2024-04-28 DIAGNOSIS — M6281 Muscle weakness (generalized): Secondary | ICD-10-CM

## 2024-04-28 NOTE — Therapy (Signed)
 OUTPATIENT PHYSICAL THERAPY KNEE TREATMENT  Patient Name: Colleen Price MRN: [id number] DOB:November 16, 1955, 68 y.o., female Today's Date: 04/28/2024  END OF SESSION:  PT End of Session - 04/28/24 0817     Visit Number 9    Number of Visits 13    Date for Recertification  05/15/24    PT Start Time 0815    PT Stop Time 0856    PT Time Calculation (min) 41 min    Activity Tolerance Patient tolerated treatment well    Behavior During Therapy WFL for tasks assessed/performed           Past Medical History:  Diagnosis Date   Arthritis    KNEES   Cancer (HCC)    Complication of anesthesia    PONV (postoperative nausea and vomiting)    PT STATES SCOPALAMINE PATCH HELPED WITH HER LAST SURGERY   Past Surgical History:  Procedure Laterality Date   ABDOMINAL HYSTERECTOMY  06/12/1988   BREAST LUMPECTOMY WITH SENTINEL LYMPH NODE BIOPSY Right 10/14/2015   Procedure: BREAST LUMPECTOMY WITH SENTINEL LYMPH NODE BX;  Surgeon: Louanne KANDICE Muse, MD;  Location: [facility];  Service: General;  Laterality: Right;   BREAST SURGERY     CATARACT EXTRACTION W/PHACO Right 01/23/2018   Procedure: CATARACT EXTRACTION PHACO AND INTRAOCULAR LENS PLACEMENT (IOC)  RIGHT;  Surgeon: Mittie Gaskin, MD;  Location: [facility];  Service: Ophthalmology;  Laterality: Right;   CATARACT EXTRACTION W/PHACO Left 02/13/2018   Procedure: CATARACT EXTRACTION PHACO AND INTRAOCULAR LENS PLACEMENT (IOC) LEFT;  Surgeon: Mittie Gaskin, MD;  Location: [facility];  Service: Ophthalmology;  Laterality: Left;   COLONOSCOPY     EYE SURGERY     JOINT REPLACEMENT     KNEE ARTHROSCOPY     REPLACEMENT TOTAL KNEE Right    TUBAL LIGATION     Patient Active Problem List   Diagnosis Date Noted   Degeneration of lumbar intervertebral disc 10/08/2023   Irritable bowel syndrome 05/23/2023   Vaccine refused by patient 12/01/2022   Generalized osteoarthritis of multiple sites 11/03/2022   History of  breast cancer in female 11/03/2022   Osteopenia of neck of femur 11/03/2022   History of anemia 11/03/2022   Multilevel degenerative disc disease 10/17/2021   Osteoarthritis of knee 10/17/2021   Primary localized osteoarthritis of pelvic region and thigh 10/17/2021    PCP: Manya Toribio SQUIBB, PA  REFERRING PROVIDER: Beverley Evalene BIRCH, MD  REFERRING DIAG: [phone] (ICD-10-CM) - Presence of right artificial knee joint   RATIONALE FOR EVALUATION AND TREATMENT: Rehabilitation  THERAPY DIAG: Right knee pain, unspecified chronicity  Muscle weakness (generalized)  Stiffness of right knee, not elsewhere classified  ONSET DATE: R TKA, DOS 04/05/23  FOLLOW-UP APPT SCHEDULED WITH REFERRING PROVIDER: None scheduled at this time  PERTINENT HISTORY: Pt is a 68 year old female known to this clinic; Hx of R TKA 04/05/23. Pt arrives today with new PT referral for R knee.   Pt currently reports R knee pain after weightbearing for prolonged periods e.g. with mowing back yard. Patient reports pain is vague and she sometimes has discomfort affecting foot/ankle. She reports tightness along medial calf. She tries to be active, but she reports paying for it after higher volume of activity in yard e.g. raking. Pt states pain is tolerable first thing in AM, but when she's weightbearing for prolonged period, she has increase in pain. Pt reports heaviness and pain from hips down to knees intermittently when rolling over in bed.   PAIN:  Pain Intensity: Present: 0/10, Best: 0/10, Worst: 8-9/10 Pain location: Vaguely around anterior knee and anterior leg pain  Pain quality: tight presently; strong dull pain  Radiating pain: Yes ; down anterior leg Swelling: No, swelling not noted per patient  Popping, catching, locking: Intermittent popping only  Numbness/Tingling: Yes; anterior knee and into her feet  Focal weakness or buckling: Yes; occasional buckling, may give a little bit Aggravating factors:  prolonged weightbearing, prolonged standing, prolonged sitting, sit to stand (after prolonged sitting especially) Relieving factors: lying down, rest 24-hour pain behavior: worse later in day with activity  How long can you sit: 1.5 hr  How long can you stand: No timeframe stated  History of prior back, hip, or knee injury, pain, surgery, or therapy: Yes; R TKA 04/05/23, R hip pain that improved s/p cortisone injection;   Imaging: Yes ; pt secondary report - no hardware misalignment or mechanical issues on radiographs Prior level of function: Independent Occupational demands: Part-time job; helping business with bookkeeping  Hobbies: Crafting Red flags: Negative for chills/fever, night sweats, nausea, vomiting, unexplained weight gain/loss, unrelenting pain   -hx of cancer, in remission 6 years   PRECAUTIONS: None  WEIGHT BEARING RESTRICTIONS: No  FALLS: Has patient fallen in last 6 months? Single fall in spring when dog charged her; pt had X-rays after this to rule out hardware issue for R knee (early Spring 2025)  Living Environment Lives with: lives with their spouse, son lives an hour away  Lives in: House/apartment 2 steps to get into home - step down into family room (2 steps) - no handrail for home entrance; home is one level. Gravel to walk into home; concrete sidewalk to get to/from car.   Patient Goals: Improved pain for RLE/R knee    OBJECTIVE (data from initial evaluation unless otherwise dated):   Patient Surveys  LEFS  Extreme difficulty/unable (0), Quite a bit of difficulty (1), Moderate difficulty (2), Little difficulty (3), No difficulty (4) Survey date:   03/31/24  Any of your usual work, housework or school activities 2  2. Usual hobbies, recreational or sporting activities 2  3. Getting into/out of the bath 0  4. Walking between rooms 4  5. Putting on socks/shoes 3  6. Squatting  1  7. Lifting an object, like a bag of groceries from the floor 4  8.  Performing light activities around your home 4  9. Performing heavy activities around your home 3  10. Getting into/out of a car 4  11. Walking 2 blocks 3  12. Walking 1 mile 3  13. Going up/down 10 stairs (1 flight) 4  14. Standing for 1 hour 3  15.  sitting for 1 hour 2  16. Running on even ground 2  17. Running on uneven ground 2  18. Making sharp turns while running fast 2  19. Hopping  2  20. Rolling over in bed 1  Score total:  51/80 = 63.75%      Gross Musculoskeletal Assessment Tremor: None Bulk: R quadriceps atrophy  Tone: Normal  GAIT: Distance walked: 80 ft Assistive device utilized: None Level of assistance: Complete Independence Comments: Dec stance time on RLE, R genu valgum with loading response (moderate)  Attempted squat, knees ahead of toes, dec weightbearing to heels  Posture: Moderate FHRS, R>L mild genu valgum  AROM AROM (Normal range in degrees) AROM 03/31/24  Hip Right Left  Flexion (125) WNL   Extension (15)    Abduction (40)  Adduction     Internal Rotation (45) WNL   External Rotation (45) WNL       Knee    Flexion (135) 124   Extension (0) +9       Ankle    Dorsiflexion (20) 15   Plantarflexion (50)    Inversion (35)    Eversion (15    (* = pain; Blank rows = not tested)  LE MMT: MMT (out of 5) Right 03/31/24 Left 03/31/24  Hip flexion 4- 4  Hip extension 4 4  Hip abduction 4-   Hip adduction    Hip internal rotation    Hip external rotation    Knee flexion 4- 5  Knee extension 4 5-  Ankle dorsiflexion 5 5  Ankle plantarflexion    Ankle inversion    Ankle eversion    (* = pain; Blank rows = not tested)  Sensation Deferred  Reflexes Deferred  Muscle Length Hamstrings: R: Negative L: Negative Quadriceps ([place]): R: Positive L: Positive   Palpation Location LEFT  RIGHT           Quadriceps  0  Medial Hamstrings  1  Lateral Hamstrings  1  Lateral Hamstring tendon  1  Medial Hamstring tendon  1  Quadriceps  tendon  0  Patella    Patellar Tendon  2  Tibial Tuberosity  2  Medial joint line  0  Lateral joint line  0  MCL  1  LCL  1  Adductor Tubercle    Pes Anserine tendon    Infrapatellar fat pad  0  Fibular head  1  Popliteal fossa    (Blank rows = not tested) Graded on 0-4 scale (0 = no pain, 1 = pain, 2 = pain with wincing/grimacing/flinching, 3 = pain with withdrawal, 4 = unwilling to allow palpation), (Blank rows = not tested)  Passive Accessory Motion Patellofemoral: Superior Glide: R: Negative L: Negative Inferior Glide: R: Negative L: Negative Medial Glide: R: Negative L: Negative Lateral Glide: R: Negative L: Negative    VASCULAR Deferred   SPECIAL TESTS  Patellar Tendinopathy Inferior pole palpation with anterior tilt: R: Positive L: Not examined      TODAY'S TREATMENT   04/28/2024:   SUBJECTIVE STATEMENT:   Patient reports having 2 days of feeling relatively normal between last visit and arrival today. Patient reports feeling that her pain/dysfunction was back to baseline yesterday.    Manual Therapy - for symptom modulation, soft tissue sensitivity and mobility, joint mobility, ROM  STM and IASTM with Hypervolt along R hamtrings and gastrocnemius; x 10 minutes   Therapeutic Exercise - for improved soft tissue flexibility and extensibility as needed for ROM, improved strength as needed to improve performance of CKC activities/functional movements  Supine HS stretch with blanket around foot; 2 x 30 sec  SLR; 2 x 10, 5-lb ankle weight   Bridge with BOSU ball; 2 x 10, 5 sec isometric hold   Seated Hamstring stretch, reviewed Standing gastrocnemius lunge stretch, reviewed.   Pt edu: HEP updated for hamstring/gastroc stretching.    *not today* Passive prone quad stretch; 3 x 30 sec for R quadriceps Prone alternating hip extension; 2 x 10 alt R/L   Therapeutic Activity:  Sit to stand, chair + Airex for increased seat height; 2 x 10 with no UE  support, Black Tband for hip ABD isometric  TRX reverse lunge; 2 x 10, bilat  Lateral stepdown, 6-inch step; 2 x 10 (standing on RLE today)  Total Gym single-limb minisquat; Level 22, on RLE; 2 x 10    *not today* SciFit x 5 minutes - Level 8 - for improved soft tissue mobility and increased tissue temperature to improve muscle performance  Tandem standing on long Airex; 1 x 30 sec  Single limb stance; x 30 sec, intermittent light touch Lateral step over 6 hurdle 2 x 10 each direction - no UE support  TRX mini squats 3 x 10 (within pain tolerance) Standing marching x 30 with single UE support with 3# AW each  Standing hip abduction 3 x 10 with B UE support with 3# AW each  Standing hip extension 3 x 10 with BUE support with 3# AW each  Fwd step downs 6 step 2 x 10 leading with each LE  Monster walk; Blue Tband just above patellae; 3x D/B length of // bars  -catching pain along contralateral (L) knee with successive steps, stopped at 3 reps due to this STM and IASTM with Theraband roller along R quads; x 6 minutes Bridge with Black Tband; 2 x 10, 3-sec isometric 3-way touch with isometric minisquat, Red Tband around ankles; x 8 ea dir, bilat  Minisquat with 5-sec isometric; 2 x 8, 5 sec hold at bottom SLS on Airex; 2 x 20 sec with intermittent light touch Lateral step-up, 6-inch step; 2 x 10   PATIENT EDUCATION:  Education details: see above for patient education details Person educated: Patient Education method: Explanation, Demonstration, and Handouts Education comprehension: verbalized understanding and returned demonstration   HOME EXERCISE PROGRAM:  Access Code: [email or web address] URL: [email or web address] Date: 04/28/2024 Prepared by: Venetia Endo  Exercises - Prone Quadriceps Stretch with Strap  - 2 x daily - 7 x weekly - 3 sets - 30sec hold - Seated Hamstring Stretch  - 2 x daily - 7 x weekly - 3 sets - 30sec hold - Gastroc Stretch on Wall  - 2 x  daily - 7 x weekly - 3 sets - 30sec hold - Active Straight Leg Raise with Quad Set  - 2 x daily - 7 x weekly - 2 sets - 10 reps - Sidelying Hip Abduction  - 2 x daily - 7 x weekly - 2 sets - 10 reps - Standing Double Leg Mini Squat  - 2 x daily - 7 x weekly - 2 sets - 10 reps - 5sec hold - Single Leg Stance  - 2 x daily - 7 x weekly - 3 sets - 30sec hold - Supine Bridge with Resistance Band  - 2 x daily - 5-7 x weekly - 2 sets - 10 reps - Standing 3-Way Leg Reach with Resistance at Ankles and Unilateral Counter Support  - 2 x daily - 5-7 x weekly - 2 sets - 10 reps   ASSESSMENT:  CLINICAL IMPRESSION:  Patient feels that she had 2-day period of feeling somewhat normal and at her prior level of function. Patient has ongoing limitation with prolonged weightbearing/walking on post-op lower extremity (just over 1 year post-op), and her condition is complicated by contralateral knee OA/pain. She has c/o tightness and pain in popliteal region/posterior leg. We updated her program to include stretching for posterior chain and continued quad and knee complex stabilization drills.  Pt will continue to benefit from skilled PT services to address deficits and improve function.  OBJECTIVE IMPAIRMENTS: Abnormal gait, difficulty walking, decreased ROM, decreased strength, hypomobility, impaired flexibility, and pain.   ACTIVITY LIMITATIONS: lifting, bending, sitting, standing, squatting, stairs, transfers, bed mobility, and locomotion  level  PARTICIPATION LIMITATIONS: cleaning, driving, shopping, community activity, and yard work  PERSONAL FACTORS: Age, Past/current experiences, Time since onset of injury/illness/exacerbation, and 1-2 comorbidities: (Hx of breast cancer, OA) are also affecting patient's functional outcome.   REHAB POTENTIAL: Fair given length of time from Sx and chronicity  CLINICAL DECISION MAKING: Evolving/moderate complexity  EVALUATION COMPLEXITY: Moderate   GOALS: Goals  reviewed with patient? Yes  SHORT TERM GOALS: Target date: 04/24/2024  Pt will be independent with HEP to improve strength and decrease knee pain to improve pain-free function at home and work. Baseline: 03/31/24: Baseline HEP initiated Goal status: INITIAL   LONG TERM GOALS: Target date: 05/15/2024  Pt will demonstrate pain-free sit to stand from standard-height chair without dynamic valgus compensation indicative of improved functional LE strength.  Baseline: 03/31/24: Pain with sit to stand, dynamic valgus movement fault. Goal status: INITIAL  2.  Pt will decrease worst knee pain by at least 3 points on the NPRS in order to demonstrate clinically significant reduction in knee pain. Baseline: 03/31/24: 8-9/10 at worst.  Goal status: INITIAL  3.  Pt will decrease LEFS score by at least 9 points in order demonstrate clinically significant reduction in knee pain/disability.       Baseline: 03/31/24: 63.75% Goal status: INITIAL  4.  Pt will increase strength of R hip and knee tested musculature to at least 4+/5 or greater MMT grade in order to demonstrate improvement in strength and function  Baseline: 03/31/24: 4- to 4/5 (see chart above). Goal status: INITIAL   PLAN: PT FREQUENCY: 1-2x/week  PT DURATION: 6 weeks  PLANNED INTERVENTIONS: Therapeutic exercises, Therapeutic activity, Neuromuscular re-education, Balance training, Gait training, Patient/Family education, Self Care, Joint mobilization, Joint manipulation, Vestibular training, Canalith repositioning, Orthotic/Fit training, DME instructions, Dry Needling, Electrical stimulation, Spinal manipulation, Spinal mobilization, Cryotherapy, Moist heat, Taping, Traction, Ultrasound, Ionotophoresis 4mg /ml Dexamethasone , Manual therapy, and Re-evaluation.  PLAN FOR NEXT SESSION: Quad flexibility, quad and gluteal strengthening, isometric exercise to promote stability and progressive CKC stabilization drills.    Venetia Endo, PT,  DPT [phone] Physical Therapist - [facility] 04/28/2024, 8:17 AM

## 2024-04-30 ENCOUNTER — Ambulatory Visit: Admitting: Physical Therapy

## 2024-04-30 DIAGNOSIS — M6281 Muscle weakness (generalized): Secondary | ICD-10-CM

## 2024-04-30 DIAGNOSIS — M25561 Pain in right knee: Secondary | ICD-10-CM

## 2024-04-30 DIAGNOSIS — M25661 Stiffness of right knee, not elsewhere classified: Secondary | ICD-10-CM

## 2024-04-30 NOTE — Therapy (Signed)
 OUTPATIENT PHYSICAL THERAPY TREATMENT AND PROGRESS NOTE   Dates of reporting period  03/31/24   to   04/30/24   Patient Name: Colleen Price MRN: [id number] DOB:1955/07/15, 68 y.o., female Today's Date: 04/30/2024  END OF SESSION:  PT End of Session - 04/30/24 0819     Visit Number 10    Number of Visits 13    Date for Recertification  05/15/24    PT Start Time 0819    PT Stop Time 0900    PT Time Calculation (min) 41 min    Activity Tolerance Patient tolerated treatment well    Behavior During Therapy WFL for tasks assessed/performed            Past Medical History:  Diagnosis Date   Arthritis    KNEES   Cancer (HCC)    Complication of anesthesia    PONV (postoperative nausea and vomiting)    PT STATES SCOPALAMINE PATCH HELPED WITH HER LAST SURGERY   Past Surgical History:  Procedure Laterality Date   ABDOMINAL HYSTERECTOMY  06/12/1988   BREAST LUMPECTOMY WITH SENTINEL LYMPH NODE BIOPSY Right 10/14/2015   Procedure: BREAST LUMPECTOMY WITH SENTINEL LYMPH NODE BX;  Surgeon: Louanne KANDICE Muse, MD;  Location: [facility];  Service: General;  Laterality: Right;   BREAST SURGERY     CATARACT EXTRACTION W/PHACO Right 01/23/2018   Procedure: CATARACT EXTRACTION PHACO AND INTRAOCULAR LENS PLACEMENT (IOC)  RIGHT;  Surgeon: Mittie Gaskin, MD;  Location: [facility];  Service: Ophthalmology;  Laterality: Right;   CATARACT EXTRACTION W/PHACO Left 02/13/2018   Procedure: CATARACT EXTRACTION PHACO AND INTRAOCULAR LENS PLACEMENT (IOC) LEFT;  Surgeon: Mittie Gaskin, MD;  Location: [facility];  Service: Ophthalmology;  Laterality: Left;   COLONOSCOPY     EYE SURGERY     JOINT REPLACEMENT     KNEE ARTHROSCOPY     REPLACEMENT TOTAL KNEE Right    TUBAL LIGATION     Patient Active Problem List   Diagnosis Date Noted   Degeneration of lumbar intervertebral disc 10/08/2023   Irritable bowel syndrome 05/23/2023   Vaccine refused by patient 12/01/2022    Generalized osteoarthritis of multiple sites 11/03/2022   History of breast cancer in female 11/03/2022   Osteopenia of neck of femur 11/03/2022   History of anemia 11/03/2022   Multilevel degenerative disc disease 10/17/2021   Osteoarthritis of knee 10/17/2021   Primary localized osteoarthritis of pelvic region and thigh 10/17/2021    PCP: Manya Toribio SQUIBB, PA  REFERRING PROVIDER: Beverley Evalene BIRCH, MD  REFERRING DIAG: [phone] (ICD-10-CM) - Presence of right artificial knee joint   RATIONALE FOR EVALUATION AND TREATMENT: Rehabilitation  THERAPY DIAG: Right knee pain, unspecified chronicity  Muscle weakness (generalized)  Stiffness of right knee, not elsewhere classified  ONSET DATE: R TKA, DOS 04/05/23  FOLLOW-UP APPT SCHEDULED WITH REFERRING PROVIDER: None scheduled at this time  PERTINENT HISTORY: Pt is a 68 year old female known to this clinic; Hx of R TKA 04/05/23. Pt arrives today with new PT referral for R knee.   Pt currently reports R knee pain after weightbearing for prolonged periods e.g. with mowing back yard. Patient reports pain is vague and she sometimes has discomfort affecting foot/ankle. She reports tightness along medial calf. She tries to be active, but she reports paying for it after higher volume of activity in yard e.g. raking. Pt states pain is tolerable first thing in AM, but when she's weightbearing for prolonged period, she has increase in pain. Pt reports  heaviness and pain from hips down to knees intermittently when rolling over in bed.   PAIN:   Pain Intensity: Present: 0/10, Best: 0/10, Worst: 8-9/10 Pain location: Vaguely around anterior knee and anterior leg pain  Pain quality: tight presently; strong dull pain  Radiating pain: Yes ; down anterior leg Swelling: No, swelling not noted per patient  Popping, catching, locking: Intermittent popping only  Numbness/Tingling: Yes; anterior knee and into her feet  Focal weakness or buckling:  Yes; occasional buckling, may give a little bit Aggravating factors: prolonged weightbearing, prolonged standing, prolonged sitting, sit to stand (after prolonged sitting especially) Relieving factors: lying down, rest 24-hour pain behavior: worse later in day with activity  How long can you sit: 1.5 hr  How long can you stand: No timeframe stated  History of prior back, hip, or knee injury, pain, surgery, or therapy: Yes; R TKA 04/05/23, R hip pain that improved s/p cortisone injection;   Imaging: Yes ; pt secondary report - no hardware misalignment or mechanical issues on radiographs Prior level of function: Independent Occupational demands: Part-time job; helping business with bookkeeping  Hobbies: Crafting Red flags: Negative for chills/fever, night sweats, nausea, vomiting, unexplained weight gain/loss, unrelenting pain   -hx of cancer, in remission 6 years   PRECAUTIONS: None  WEIGHT BEARING RESTRICTIONS: No  FALLS: Has patient fallen in last 6 months? Single fall in spring when dog charged her; pt had X-rays after this to rule out hardware issue for R knee (early Spring 2025)  Living Environment Lives with: lives with their spouse, son lives an hour away  Lives in: House/apartment 2 steps to get into home - step down into family room (2 steps) - no handrail for home entrance; home is one level. Gravel to walk into home; concrete sidewalk to get to/from car.   Patient Goals: Improved pain for RLE/R knee    OBJECTIVE (data from initial evaluation unless otherwise dated):   Patient Surveys  LEFS  Extreme difficulty/unable (0), Quite a bit of difficulty (1), Moderate difficulty (2), Little difficulty (3), No difficulty (4) Survey date:   03/31/24  Any of your usual work, housework or school activities 2  2. Usual hobbies, recreational or sporting activities 2  3. Getting into/out of the bath 0  4. Walking between rooms 4  5. Putting on socks/shoes 3  6. Squatting  1  7.  Lifting an object, like a bag of groceries from the floor 4  8. Performing light activities around your home 4  9. Performing heavy activities around your home 3  10. Getting into/out of a car 4  11. Walking 2 blocks 3  12. Walking 1 mile 3  13. Going up/down 10 stairs (1 flight) 4  14. Standing for 1 hour 3  15.  sitting for 1 hour 2  16. Running on even ground 2  17. Running on uneven ground 2  18. Making sharp turns while running fast 2  19. Hopping  2  20. Rolling over in bed 1  Score total:  51/80 = 63.75%      Gross Musculoskeletal Assessment Tremor: None Bulk: R quadriceps atrophy  Tone: Normal  GAIT: Distance walked: 80 ft Assistive device utilized: None Level of assistance: Complete Independence Comments: Dec stance time on RLE, R genu valgum with loading response (moderate)  Attempted squat, knees ahead of toes, dec weightbearing to heels  Posture: Moderate FHRS, R>L mild genu valgum  AROM AROM (Normal range in degrees) AROM 03/31/24  Hip Right Left  Flexion (125) WNL   Extension (15)    Abduction (40)    Adduction     Internal Rotation (45) WNL   External Rotation (45) WNL       Knee    Flexion (135) 124   Extension (0) +9       Ankle    Dorsiflexion (20) 15   Plantarflexion (50)    Inversion (35)    Eversion (15    (* = pain; Blank rows = not tested)  LE MMT: MMT (out of 5) Right 03/31/24 Left 03/31/24 Right 04/30/24 Left 04/30/24  Hip flexion 4- 4 4 4+  Hip extension 4 4 4 4   Hip abduction 4-  4   Hip adduction      Hip internal rotation      Hip external rotation      Knee flexion 4- 5 5 4+  Knee extension 4 5- 5- 4+*  Ankle dorsiflexion 5 5    Ankle plantarflexion      Ankle inversion      Ankle eversion      (* = pain; Blank rows = not tested)  Sensation Deferred  Reflexes Deferred  Muscle Length Hamstrings: R: Negative L: Negative Quadriceps ([place]): R: Positive L: Positive   Palpation Location LEFT  RIGHT            Quadriceps  0  Medial Hamstrings  1  Lateral Hamstrings  1  Lateral Hamstring tendon  1  Medial Hamstring tendon  1  Quadriceps tendon  0  Patella    Patellar Tendon  2  Tibial Tuberosity  2  Medial joint line  0  Lateral joint line  0  MCL  1  LCL  1  Adductor Tubercle    Pes Anserine tendon    Infrapatellar fat pad  0  Fibular head  1  Popliteal fossa    (Blank rows = not tested) Graded on 0-4 scale (0 = no pain, 1 = pain, 2 = pain with wincing/grimacing/flinching, 3 = pain with withdrawal, 4 = unwilling to allow palpation), (Blank rows = not tested)  Passive Accessory Motion Patellofemoral: Superior Glide: R: Negative L: Negative Inferior Glide: R: Negative L: Negative Medial Glide: R: Negative L: Negative Lateral Glide: R: Negative L: Negative    VASCULAR Deferred   SPECIAL TESTS  Patellar Tendinopathy Inferior pole palpation with anterior tilt: R: Positive L: Not examined      TODAY'S TREATMENT   04/30/2024:   SUBJECTIVE STATEMENT:   Patient reports mild pain at arrival to PT. She reports making small progress since starting PT. Patient reports 8/10 pain at worst over last week. Patient reports 50% global rating of function at this time. Patient reports frequent clicking with moving knee in sitting. Patient reports having more episodes of clicking versus 2 months. Patient reports being more limited with working outside of her house and being active with yard work/house projects.    *GOAL UPDATE PERFORMED   Therapeutic Exercise - for improved soft tissue flexibility and extensibility as needed for ROM, improved strength as needed to improve performance of CKC activities/functional movements  Nustep x 5 minutes - Level 5 - for improved soft tissue mobility and increased tissue temperature to improve muscle performance   *next visit* Bridge with BOSU ball; 2 x 10, 5 sec isometric hold  SLR; 2 x 10, 5-lb ankle weight   *not today* Seated Hamstring stretch,  reviewed Standing gastrocnemius lunge stretch, reviewed.  Passive prone quad stretch; 3 x 30 sec for R quadriceps Prone alternating hip extension; 2 x 10 alt R/L   Therapeutic Activity:  Total Gym single-limb minisquat; Level 22, on RLE; 2 x 15  Sit to stand, chair + Airex for increased seat height; 2 x 10 with no UE support, Black Tband for hip ABD isometric  TRX reverse lunge; 2 x 10, bilat  Lateral stepdown, 6-inch step; 2 x 10 (standing on RLE today)     *not today* Tandem standing on long Airex; 1 x 30 sec  Single limb stance; x 30 sec, intermittent light touch Lateral step over 6 hurdle 2 x 10 each direction - no UE support  TRX mini squats 3 x 10 (within pain tolerance) Standing marching x 30 with single UE support with 3# AW each  Standing hip abduction 3 x 10 with B UE support with 3# AW each  Standing hip extension 3 x 10 with BUE support with 3# AW each  Fwd step downs 6 step 2 x 10 leading with each LE  Monster walk; Blue Tband just above patellae; 3x D/B length of // bars  -catching pain along contralateral (L) knee with successive steps, stopped at 3 reps due to this STM and IASTM with Theraband roller along R quads; x 6 minutes Bridge with Black Tband; 2 x 10, 3-sec isometric 3-way touch with isometric minisquat, Red Tband around ankles; x 8 ea dir, bilat  Minisquat with 5-sec isometric; 2 x 8, 5 sec hold at bottom SLS on Airex; 2 x 20 sec with intermittent light touch Lateral step-up, 6-inch step; 2 x 10   PATIENT EDUCATION:  Education details: see above for patient education details Person educated: Patient Education method: Explanation, Demonstration, and Handouts Education comprehension: verbalized understanding and returned demonstration   HOME EXERCISE PROGRAM:  Access Code: [email or web address] URL: [email or web address] Date: 04/28/2024 Prepared by: Venetia Endo  Exercises - Prone Quadriceps Stretch with Strap  - 2 x daily - 7 x  weekly - 3 sets - 30sec hold - Seated Hamstring Stretch  - 2 x daily - 7 x weekly - 3 sets - 30sec hold - Gastroc Stretch on Wall  - 2 x daily - 7 x weekly - 3 sets - 30sec hold - Active Straight Leg Raise with Quad Set  - 2 x daily - 7 x weekly - 2 sets - 10 reps - Sidelying Hip Abduction  - 2 x daily - 7 x weekly - 2 sets - 10 reps - Standing Double Leg Mini Squat  - 2 x daily - 7 x weekly - 2 sets - 10 reps - 5sec hold - Single Leg Stance  - 2 x daily - 7 x weekly - 3 sets - 30sec hold - Supine Bridge with Resistance Band  - 2 x daily - 5-7 x weekly - 2 sets - 10 reps - Standing 3-Way Leg Reach with Resistance at Ankles and Unilateral Counter Support  - 2 x daily - 5-7 x weekly - 2 sets - 10 reps   ASSESSMENT:  CLINICAL IMPRESSION:  Patient has experienced fleeting period of functioning at her normal level, though this was short-lived and pt has experienced frequent episodes of discomfort with prolonged standing and ambulatory activity outside of her home at 1 year post-op following TKA. Her level of function is also affected by contralateral knee OA and associated pain/stiffness. She exhibits improved quad and hamstrings strength, but proximal strength (hip flexors and gluteal mm) are still  notably weak. She has not significantly improved LEFS or pain rating scale at worst. Sit to stand transfers are performed readily, albeit with apparent valgus movement fault remaining. Given modest progress, pt was advised to follow-up with orthopedist's office. We can continue PT at least until she has f/u with orthopedics given small degree of progress obtained. Pt will continue to benefit from skilled PT services to address deficits and improve function.  OBJECTIVE IMPAIRMENTS: Abnormal gait, difficulty walking, decreased ROM, decreased strength, hypomobility, impaired flexibility, and pain.   ACTIVITY LIMITATIONS: lifting, bending, sitting, standing, squatting, stairs, transfers, bed mobility, and  locomotion level  PARTICIPATION LIMITATIONS: cleaning, driving, shopping, community activity, and yard work  PERSONAL FACTORS: Age, Past/current experiences, Time since onset of injury/illness/exacerbation, and 1-2 comorbidities: (Hx of breast cancer, OA) are also affecting patient's functional outcome.   REHAB POTENTIAL: Fair given length of time from Sx and chronicity  CLINICAL DECISION MAKING: Evolving/moderate complexity  EVALUATION COMPLEXITY: Moderate   GOALS: Goals reviewed with patient? Yes  SHORT TERM GOALS: Target date: 04/24/2024  Pt will be independent with HEP to improve strength and decrease knee pain to improve pain-free function at home and work. Baseline: 03/31/24: Baseline HEP initiated.   04/30/24: Compliant with HEP.  Goal status: ACHIEVED   LONG TERM GOALS: Target date: 05/15/2024  Pt will demonstrate pain-free sit to stand from standard-height chair without dynamic valgus compensation indicative of improved functional LE strength.  Baseline: 03/31/24: Pain with sit to stand, dynamic valgus movement fault.   04/30/24: Dynamic valgus, mild pain reported; pt able to perform readily  Goal status: ON-GOING  2.  Pt will decrease worst knee pain by at least 3 points on the NPRS in order to demonstrate clinically significant reduction in knee pain. Baseline: 03/31/24: 8-9/10 at worst.    04/30/24: 8/10 at worst.  Goal status: NOT MET   3.  Pt will decrease LEFS score by at least 9 points in order demonstrate clinically significant reduction in knee pain/disability.       Baseline: 03/31/24: 51/80 = 63.75%   04/30/24:  49/80 = 61.25% Goal status: NOT MET   4.  Pt will increase strength of R hip and knee tested musculature to at least 4+/5 or greater MMT grade in order to demonstrate improvement in strength and function  Baseline: 03/31/24: 4- to 4/5 (see chart above).   04/30/24: Good quad and hamstring strength, remaining hip abduction and extension strength  deficit. Goal status: IN PROGRESS   PLAN: PT FREQUENCY: 1-2x/week  PT DURATION: 6 weeks  PLANNED INTERVENTIONS: Therapeutic exercises, Therapeutic activity, Neuromuscular re-education, Balance training, Gait training, Patient/Family education, Self Care, Joint mobilization, Joint manipulation, Vestibular training, Canalith repositioning, Orthotic/Fit training, DME instructions, Dry Needling, Electrical stimulation, Spinal manipulation, Spinal mobilization, Cryotherapy, Moist heat, Taping, Traction, Ultrasound, Ionotophoresis 4mg /ml Dexamethasone , Manual therapy, and Re-evaluation.  PLAN FOR NEXT SESSION: Quad/HS/gastroc flexibility, quad and gluteal strengthening, isometric exercise to promote stability and progressive CKC stabilization drills.    Venetia Endo, PT, DPT [phone] Physical Therapist - [facility] 04/30/2024, 8:19 AM

## 2024-05-05 ENCOUNTER — Ambulatory Visit: Admitting: Physical Therapy

## 2024-05-05 DIAGNOSIS — M6281 Muscle weakness (generalized): Secondary | ICD-10-CM

## 2024-05-05 DIAGNOSIS — M25561 Pain in right knee: Secondary | ICD-10-CM | POA: Diagnosis not present

## 2024-05-05 DIAGNOSIS — M25661 Stiffness of right knee, not elsewhere classified: Secondary | ICD-10-CM

## 2024-05-05 NOTE — Therapy (Unsigned)
 OUTPATIENT PHYSICAL THERAPY TREATMENT  Patient Name: Colleen Price MRN: [id number] DOB:21-Jun-1955, 68 y.o., female Today's Date: 05/05/2024  END OF SESSION:  PT End of Session - 05/05/24 0822     Visit Number 11    Number of Visits 13    Date for Recertification  05/15/24    PT Start Time 0820    PT Stop Time 0900    PT Time Calculation (min) 40 min    Activity Tolerance Patient tolerated treatment well    Behavior During Therapy WFL for tasks assessed/performed          Past Medical History:  Diagnosis Date   Arthritis    KNEES   Cancer (HCC)    Complication of anesthesia    PONV (postoperative nausea and vomiting)    PT STATES SCOPALAMINE PATCH HELPED WITH HER LAST SURGERY   Past Surgical History:  Procedure Laterality Date   ABDOMINAL HYSTERECTOMY  06/12/1988   BREAST LUMPECTOMY WITH SENTINEL LYMPH NODE BIOPSY Right 10/14/2015   Procedure: BREAST LUMPECTOMY WITH SENTINEL LYMPH NODE BX;  Surgeon: Louanne KANDICE Muse, MD;  Location: [facility];  Service: General;  Laterality: Right;   BREAST SURGERY     CATARACT EXTRACTION W/PHACO Right 01/23/2018   Procedure: CATARACT EXTRACTION PHACO AND INTRAOCULAR LENS PLACEMENT (IOC)  RIGHT;  Surgeon: Mittie Gaskin, MD;  Location: [facility];  Service: Ophthalmology;  Laterality: Right;   CATARACT EXTRACTION W/PHACO Left 02/13/2018   Procedure: CATARACT EXTRACTION PHACO AND INTRAOCULAR LENS PLACEMENT (IOC) LEFT;  Surgeon: Mittie Gaskin, MD;  Location: [facility];  Service: Ophthalmology;  Laterality: Left;   COLONOSCOPY     EYE SURGERY     JOINT REPLACEMENT     KNEE ARTHROSCOPY     REPLACEMENT TOTAL KNEE Right    TUBAL LIGATION     Patient Active Problem List   Diagnosis Date Noted   Degeneration of lumbar intervertebral disc 10/08/2023   Irritable bowel syndrome 05/23/2023   Vaccine refused by patient 12/01/2022   Generalized osteoarthritis of multiple sites 11/03/2022   History of breast  cancer in female 11/03/2022   Osteopenia of neck of femur 11/03/2022   History of anemia 11/03/2022   Multilevel degenerative disc disease 10/17/2021   Osteoarthritis of knee 10/17/2021   Primary localized osteoarthritis of pelvic region and thigh 10/17/2021    PCP: Manya Toribio SQUIBB, PA  REFERRING PROVIDER: Beverley Evalene BIRCH, MD  REFERRING DIAG: [phone] (ICD-10-CM) - Presence of right artificial knee joint   RATIONALE FOR EVALUATION AND TREATMENT: Rehabilitation  THERAPY DIAG: Right knee pain, unspecified chronicity  Muscle weakness (generalized)  Stiffness of right knee, not elsewhere classified  ONSET DATE: R TKA, DOS 04/05/23  FOLLOW-UP APPT SCHEDULED WITH REFERRING PROVIDER: None scheduled at this time  PERTINENT HISTORY: Pt is a 68 year old female known to this clinic; Hx of R TKA 04/05/23. Pt arrives today with new PT referral for R knee.   Pt currently reports R knee pain after weightbearing for prolonged periods e.g. with mowing back yard. Patient reports pain is vague and she sometimes has discomfort affecting foot/ankle. She reports tightness along medial calf. She tries to be active, but she reports paying for it after higher volume of activity in yard e.g. raking. Pt states pain is tolerable first thing in AM, but when she's weightbearing for prolonged period, she has increase in pain. Pt reports heaviness and pain from hips down to knees intermittently when rolling over in bed.   PAIN:   Pain  Intensity: Present: 0/10, Best: 0/10, Worst: 8-9/10 Pain location: Vaguely around anterior knee and anterior leg pain  Pain quality: tight presently; strong dull pain  Radiating pain: Yes ; down anterior leg Swelling: No, swelling not noted per patient  Popping, catching, locking: Intermittent popping only  Numbness/Tingling: Yes; anterior knee and into her feet  Focal weakness or buckling: Yes; occasional buckling, may give a little bit Aggravating factors: prolonged  weightbearing, prolonged standing, prolonged sitting, sit to stand (after prolonged sitting especially) Relieving factors: lying down, rest 24-hour pain behavior: worse later in day with activity  How long can you sit: 1.5 hr  How long can you stand: No timeframe stated  History of prior back, hip, or knee injury, pain, surgery, or therapy: Yes; R TKA 04/05/23, R hip pain that improved s/p cortisone injection;   Imaging: Yes ; pt secondary report - no hardware misalignment or mechanical issues on radiographs Prior level of function: Independent Occupational demands: Part-time job; helping business with bookkeeping  Hobbies: Crafting Red flags: Negative for chills/fever, night sweats, nausea, vomiting, unexplained weight gain/loss, unrelenting pain   -hx of cancer, in remission 6 years   PRECAUTIONS: None  WEIGHT BEARING RESTRICTIONS: No  FALLS: Has patient fallen in last 6 months? Single fall in spring when dog charged her; pt had X-rays after this to rule out hardware issue for R knee (early Spring 2025)  Living Environment Lives with: lives with their spouse, son lives an hour away  Lives in: House/apartment 2 steps to get into home - step down into family room (2 steps) - no handrail for home entrance; home is one level. Gravel to walk into home; concrete sidewalk to get to/from car.   Patient Goals: Improved pain for RLE/R knee    OBJECTIVE (data from initial evaluation unless otherwise dated):   Patient Surveys  LEFS  Extreme difficulty/unable (0), Quite a bit of difficulty (1), Moderate difficulty (2), Little difficulty (3), No difficulty (4) Survey date:   03/31/24  Any of your usual work, housework or school activities 2  2. Usual hobbies, recreational or sporting activities 2  3. Getting into/out of the bath 0  4. Walking between rooms 4  5. Putting on socks/shoes 3  6. Squatting  1  7. Lifting an object, like a bag of groceries from the floor 4  8. Performing light  activities around your home 4  9. Performing heavy activities around your home 3  10. Getting into/out of a car 4  11. Walking 2 blocks 3  12. Walking 1 mile 3  13. Going up/down 10 stairs (1 flight) 4  14. Standing for 1 hour 3  15.  sitting for 1 hour 2  16. Running on even ground 2  17. Running on uneven ground 2  18. Making sharp turns while running fast 2  19. Hopping  2  20. Rolling over in bed 1  Score total:  51/80 = 63.75%      Gross Musculoskeletal Assessment Tremor: None Bulk: R quadriceps atrophy  Tone: Normal  GAIT: Distance walked: 80 ft Assistive device utilized: None Level of assistance: Complete Independence Comments: Dec stance time on RLE, R genu valgum with loading response (moderate)  Attempted squat, knees ahead of toes, dec weightbearing to heels  Posture: Moderate FHRS, R>L mild genu valgum  AROM AROM (Normal range in degrees) AROM 03/31/24  Hip Right Left  Flexion (125) WNL   Extension (15)    Abduction (40)    Adduction  Internal Rotation (45) WNL   External Rotation (45) WNL       Knee    Flexion (135) 124   Extension (0) +9       Ankle    Dorsiflexion (20) 15   Plantarflexion (50)    Inversion (35)    Eversion (15    (* = pain; Blank rows = not tested)  LE MMT: MMT (out of 5) Right 03/31/24 Left 03/31/24 Right 04/30/24 Left 04/30/24  Hip flexion 4- 4 4 4+  Hip extension 4 4 4 4   Hip abduction 4-  4   Hip adduction      Hip internal rotation      Hip external rotation      Knee flexion 4- 5 5 4+  Knee extension 4 5- 5- 4+*  Ankle dorsiflexion 5 5    Ankle plantarflexion      Ankle inversion      Ankle eversion      (* = pain; Blank rows = not tested)  Sensation Deferred  Reflexes Deferred  Muscle Length Hamstrings: R: Negative L: Negative Quadriceps (E[place]): R: Positive L: Positive   Palpation Location LEFT  RIGHT           Quadriceps  0  Medial Hamstrings  1  Lateral Hamstrings  1  Lateral Hamstring  tendon  1  Medial Hamstring tendon  1  Quadriceps tendon  0  Patella    Patellar Tendon  2  Tibial Tuberosity  2  Medial joint line  0  Lateral joint line  0  MCL  1  LCL  1  Adductor Tubercle    Pes Anserine tendon    Infrapatellar fat pad  0  Fibular head  1  Popliteal fossa    (Blank rows = not tested) Graded on 0-4 scale (0 = no pain, 1 = pain, 2 = pain with wincing/grimacing/flinching, 3 = pain with withdrawal, 4 = unwilling to allow palpation), (Blank rows = not tested)  Passive Accessory Motion Patellofemoral: Superior Glide: R: Negative L: Negative Inferior Glide: R: Negative L: Negative Medial Glide: R: Negative L: Negative Lateral Glide: R: Negative L: Negative    VASCULAR Deferred   SPECIAL TESTS  Patellar Tendinopathy Inferior pole palpation with anterior tilt: R: Positive L: Not examined      TODAY'S TREATMENT   05/05/2024:   SUBJECTIVE STATEMENT:   Patient reports similar condition of her knees. She reports challenge with trying to strengthen R knee s/p TKA (1 year out) while L knee is still giving her issues (L knee has not been replaced). Patient reports minimal baseline pain this AM. 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    Therapeutic Exercise - for improved soft tissue flexibility and extensibility as needed for ROM, improved strength as needed to improve performance of CKC activities/functional movements  SciFit x 6 minutes - Level 7 - for improved soft tissue mobility and increased tissue temperature to improve muscle performance   Bridge with BOSU ball; 2 x 10, 5 sec isometric hold  SLR; 2 x 10, 5-lb ankle weight  Sidelying hip abduction; 2 x 10; 2-lb ankle weight   *not today* Seated Hamstring stretch, reviewed Standing gastrocnemius lunge stretch, reviewed.  Passive prone quad stretch; 3 x 30 sec for R quadriceps Prone alternating hip extension; 2 x  10 alt R/L   Therapeutic Activity:  Total Gym single-limb minisquat; Level 22,  on RLE; 2 x 15  Sit to stand, chair + Airex for increased seat height; 2 x 10 with no UE support, Black Tband for hip ABD isometric  TRX reverse lunge; 2 x 10, bilat  Lateral stepdown, 6-inch step; 2 x 10 (standing on RLE today)  Multidirectional stepping on blue star; stepping to end of blue line and back to center of star (5 directions; anterior, anterolateral R/L, lateral R/L); x 5 ea dir  Single limb stance, on Airex; intermittent light touch on bar; multiple attempts with up to 30 sec obtained    *not today* Tandem standing on long Airex; 1 x 30 sec  Lateral step over 6 hurdle 2 x 10 each direction - no UE support  TRX mini squats 3 x 10 (within pain tolerance) Standing marching x 30 with single UE support with 3# AW each  Standing hip abduction 3 x 10 with B UE support with 3# AW each  Standing hip extension 3 x 10 with BUE support with 3# AW each  Fwd step downs 6 step 2 x 10 leading with each LE  Monster walk; Blue Tband just above patellae; 3x D/B length of // bars  -catching pain along contralateral (L) knee with successive steps, stopped at 3 reps due to this STM and IASTM with Theraband roller along R quads; x 6 minutes Bridge with Black Tband; 2 x 10, 3-sec isometric 3-way touch with isometric minisquat, Red Tband around ankles; x 8 ea dir, bilat  Minisquat with 5-sec isometric; 2 x 8, 5 sec hold at bottom SLS on Airex; 2 x 20 sec with intermittent light touch Lateral step-up, 6-inch step; 2 x 10   PATIENT EDUCATION:  Education details: see above for patient education details Person educated: Patient Education method: Explanation, Demonstration, and Handouts Education comprehension: verbalized understanding and returned demonstration   HOME EXERCISE PROGRAM:  Access Code: [email or web address] URL: [email or web address] Date: 04/28/2024 Prepared by: Venetia Endo  Exercises - Prone Quadriceps Stretch with Strap  - 2 x daily - 7 x weekly - 3 sets - 30sec hold - Seated Hamstring Stretch  - 2 x daily - 7 x weekly - 3 sets - 30sec hold - Gastroc Stretch on Wall  - 2 x daily - 7 x weekly - 3 sets - 30sec hold - Active Straight Leg Raise with Quad Set  - 2 x daily - 7 x weekly - 2 sets - 10 reps - Sidelying Hip Abduction  - 2 x daily - 7 x weekly - 2 sets - 10 reps - Standing Double Leg Mini Squat  - 2 x daily - 7 x weekly - 2 sets - 10 reps - 5sec hold - Single Leg Stance  - 2 x daily - 7 x weekly - 3 sets - 30sec hold - Supine Bridge with Resistance Band  - 2 x daily - 5-7 x weekly - 2 sets - 10 reps - Standing 3-Way Leg Reach with Resistance at Ankles and Unilateral Counter Support  - 2 x daily - 5-7 x weekly - 2 sets - 10 reps   ASSESSMENT:  CLINICAL IMPRESSION:  Patient has minimal baseline pain this AM, but she still has notable limitation with prolonged weightbearing activity and completing outdoor work/yard maintenance and home projects. We continued progressive exercise with additional emphasis on hip strengthening and knee complex stabilization work. Pt is challenged with additional closed-chain work, but she tolerates most exercises relatively well. Given modest progress to date, pt has been advised  to follow-up with orthopedist's office. We can continue PT at least until she has f/u with orthopedics given small degree of progress obtained. Pt will continue to benefit from skilled PT services to address deficits and improve function.  OBJECTIVE IMPAIRMENTS: Abnormal gait, difficulty walking, decreased ROM, decreased strength, hypomobility, impaired flexibility, and pain.   ACTIVITY LIMITATIONS: lifting, bending, sitting, standing, squatting, stairs, transfers, bed mobility, and locomotion level  PARTICIPATION LIMITATIONS: cleaning, driving, shopping, community activity, and yard work  PERSONAL FACTORS: Age, Past/current experiences,  Time since onset of injury/illness/exacerbation, and 1-2 comorbidities: (Hx of breast cancer, OA) are also affecting patient's functional outcome.   REHAB POTENTIAL: Fair given length of time from Sx and chronicity  CLINICAL DECISION MAKING: Evolving/moderate complexity  EVALUATION COMPLEXITY: Moderate   GOALS: Goals reviewed with patient? Yes  SHORT TERM GOALS: Target date: 04/24/2024  Pt will be independent with HEP to improve strength and decrease knee pain to improve pain-free function at home and work. Baseline: 03/31/24: Baseline HEP initiated.   04/30/24: Compliant with HEP.  Goal status: ACHIEVED   LONG TERM GOALS: Target date: 05/15/2024  Pt will demonstrate pain-free sit to stand from standard-height chair without dynamic valgus compensation indicative of improved functional LE strength.  Baseline: 03/31/24: Pain with sit to stand, dynamic valgus movement fault.   04/30/24: Dynamic valgus, mild pain reported; pt able to perform readily  Goal status: ON-GOING  2.  Pt will decrease worst knee pain by at least 3 points on the NPRS in order to demonstrate clinically significant reduction in knee pain. Baseline: 03/31/24: 8-9/10 at worst.    04/30/24: 8/10 at worst.  Goal status: NOT MET   3.  Pt will decrease LEFS score by at least 9 points in order demonstrate clinically significant reduction in knee pain/disability.       Baseline: 03/31/24: 51/80 = 63.75%   04/30/24:  49/80 = 61.25% Goal status: NOT MET   4.  Pt will increase strength of R hip and knee tested musculature to at least 4+/5 or greater MMT grade in order to demonstrate improvement in strength and function  Baseline: 03/31/24: 4- to 4/5 (see chart above).   04/30/24: Good quad and hamstring strength, remaining hip abduction and extension strength deficit. Goal status: IN PROGRESS   PLAN: PT FREQUENCY: 1-2x/week  PT DURATION: 6 weeks  PLANNED INTERVENTIONS: Therapeutic exercises, Therapeutic activity,  Neuromuscular re-education, Balance training, Gait training, Patient/Family education, Self Care, Joint mobilization, Joint manipulation, Vestibular training, Canalith repositioning, Orthotic/Fit training, DME instructions, Dry Needling, Electrical stimulation, Spinal manipulation, Spinal mobilization, Cryotherapy, Moist heat, Taping, Traction, Ultrasound, Ionotophoresis 4mg /ml Dexamethasone , Manual therapy, and Re-evaluation.  PLAN FOR NEXT SESSION: Quad/HS/gastroc flexibility, quad and gluteal strengthening, isometric exercise to promote stability and progressive CKC stabilization drills.    Venetia Endo, PT, DPT [phone] Physical Therapist - [facility] 05/05/2024, 8:23 AM

## 2024-05-07 ENCOUNTER — Ambulatory Visit: Admitting: Physical Therapy

## 2024-05-07 ENCOUNTER — Encounter: Payer: Self-pay | Admitting: Physical Therapy

## 2024-05-07 DIAGNOSIS — M25561 Pain in right knee: Secondary | ICD-10-CM

## 2024-05-07 DIAGNOSIS — M25661 Stiffness of right knee, not elsewhere classified: Secondary | ICD-10-CM

## 2024-05-07 DIAGNOSIS — M6281 Muscle weakness (generalized): Secondary | ICD-10-CM

## 2024-05-07 NOTE — Therapy (Signed)
 OUTPATIENT PHYSICAL THERAPY TREATMENT  Patient Name: Colleen Price MRN: [id number] DOB:January 17, 1956, 68 y.o., female Today's Date: 05/07/2024  END OF SESSION:  PT End of Session - 05/07/24 0820     Visit Number 12    Number of Visits 13    Date for Recertification  05/15/24    PT Start Time 0815    PT Stop Time 0856    PT Time Calculation (min) 41 min    Activity Tolerance Patient tolerated treatment well    Behavior During Therapy WFL for tasks assessed/performed           Past Medical History:  Diagnosis Date   Arthritis    KNEES   Cancer (HCC)    Complication of anesthesia    PONV (postoperative nausea and vomiting)    PT STATES SCOPALAMINE PATCH HELPED WITH HER LAST SURGERY   Past Surgical History:  Procedure Laterality Date   ABDOMINAL HYSTERECTOMY  06/12/1988   BREAST LUMPECTOMY WITH SENTINEL LYMPH NODE BIOPSY Right 10/14/2015   Procedure: BREAST LUMPECTOMY WITH SENTINEL LYMPH NODE BX;  Surgeon: Louanne KANDICE Muse, MD;  Location: [facility];  Service: General;  Laterality: Right;   BREAST SURGERY     CATARACT EXTRACTION W/PHACO Right 01/23/2018   Procedure: CATARACT EXTRACTION PHACO AND INTRAOCULAR LENS PLACEMENT (IOC)  RIGHT;  Surgeon: Mittie Gaskin, MD;  Location: [facility];  Service: Ophthalmology;  Laterality: Right;   CATARACT EXTRACTION W/PHACO Left 02/13/2018   Procedure: CATARACT EXTRACTION PHACO AND INTRAOCULAR LENS PLACEMENT (IOC) LEFT;  Surgeon: Mittie Gaskin, MD;  Location: [facility];  Service: Ophthalmology;  Laterality: Left;   COLONOSCOPY     EYE SURGERY     JOINT REPLACEMENT     KNEE ARTHROSCOPY     REPLACEMENT TOTAL KNEE Right    TUBAL LIGATION     Patient Active Problem List   Diagnosis Date Noted   Degeneration of lumbar intervertebral disc 10/08/2023   Irritable bowel syndrome 05/23/2023   Vaccine refused by patient 12/01/2022   Generalized osteoarthritis of multiple sites 11/03/2022   History of  breast cancer in female 11/03/2022   Osteopenia of neck of femur 11/03/2022   History of anemia 11/03/2022   Multilevel degenerative disc disease 10/17/2021   Osteoarthritis of knee 10/17/2021   Primary localized osteoarthritis of pelvic region and thigh 10/17/2021    PCP: Manya Toribio SQUIBB, PA  REFERRING PROVIDER: Beverley Evalene BIRCH, MD  REFERRING DIAG: [id number] (ICD-10-CM) - Presence of right artificial knee joint   RATIONALE FOR EVALUATION AND TREATMENT: Rehabilitation  THERAPY DIAG: Right knee pain, unspecified chronicity  Muscle weakness (generalized)  Stiffness of right knee, not elsewhere classified  ONSET DATE: R TKA, DOS 04/05/23  FOLLOW-UP APPT SCHEDULED WITH REFERRING PROVIDER: None scheduled at this time  PERTINENT HISTORY: Pt is a 68 year old female known to this clinic; Hx of R TKA 04/05/23. Pt arrives today with new PT referral for R knee.   Pt currently reports R knee pain after weightbearing for prolonged periods e.g. with mowing back yard. Patient reports pain is vague and she sometimes has discomfort affecting foot/ankle. She reports tightness along medial calf. She tries to be active, but she reports paying for it after higher volume of activity in yard e.g. raking. Pt states pain is tolerable first thing in AM, but when she's weightbearing for prolonged period, she has increase in pain. Pt reports heaviness and pain from hips down to knees intermittently when rolling over in bed.   PAIN:  Pain Intensity: Present: 0/10, Best: 0/10, Worst: 8-9/10 Pain location: Vaguely around anterior knee and anterior leg pain  Pain quality: tight presently; strong dull pain  Radiating pain: Yes ; down anterior leg Swelling: No, swelling not noted per patient  Popping, catching, locking: Intermittent popping only  Numbness/Tingling: Yes; anterior knee and into her feet  Focal weakness or buckling: Yes; occasional buckling, may give a little bit Aggravating factors:  prolonged weightbearing, prolonged standing, prolonged sitting, sit to stand (after prolonged sitting especially) Relieving factors: lying down, rest 24-hour pain behavior: worse later in day with activity  How long can you sit: 1.5 hr  How long can you stand: No timeframe stated  History of prior back, hip, or knee injury, pain, surgery, or therapy: Yes; R TKA 04/05/23, R hip pain that improved s/p cortisone injection;   Imaging: Yes ; pt secondary report - no hardware misalignment or mechanical issues on radiographs Prior level of function: Independent Occupational demands: Part-time job; helping business with bookkeeping  Hobbies: Crafting Red flags: Negative for chills/fever, night sweats, nausea, vomiting, unexplained weight gain/loss, unrelenting pain   -hx of cancer, in remission 6 years   PRECAUTIONS: None  WEIGHT BEARING RESTRICTIONS: No  FALLS: Has patient fallen in last 6 months? Single fall in spring when dog charged her; pt had X-rays after this to rule out hardware issue for R knee (early Spring 2025)  Living Environment Lives with: lives with their spouse, son lives an hour away  Lives in: House/apartment 2 steps to get into home - step down into family room (2 steps) - no handrail for home entrance; home is one level. Gravel to walk into home; concrete sidewalk to get to/from car.   Patient Goals: Improved pain for RLE/R knee    OBJECTIVE (data from initial evaluation unless otherwise dated):   Patient Surveys  LEFS  Extreme difficulty/unable (0), Quite a bit of difficulty (1), Moderate difficulty (2), Little difficulty (3), No difficulty (4) Survey date:   03/31/24  Any of your usual work, housework or school activities 2  2. Usual hobbies, recreational or sporting activities 2  3. Getting into/out of the bath 0  4. Walking between rooms 4  5. Putting on socks/shoes 3  6. Squatting  1  7. Lifting an object, like a bag of groceries from the floor 4  8.  Performing light activities around your home 4  9. Performing heavy activities around your home 3  10. Getting into/out of a car 4  11. Walking 2 blocks 3  12. Walking 1 mile 3  13. Going up/down 10 stairs (1 flight) 4  14. Standing for 1 hour 3  15.  sitting for 1 hour 2  16. Running on even ground 2  17. Running on uneven ground 2  18. Making sharp turns while running fast 2  19. Hopping  2  20. Rolling over in bed 1  Score total:  51/80 = 63.75%      Gross Musculoskeletal Assessment Tremor: None Bulk: R quadriceps atrophy  Tone: Normal  GAIT: Distance walked: 80 ft Assistive device utilized: None Level of assistance: Complete Independence Comments: Dec stance time on RLE, R genu valgum with loading response (moderate)  Attempted squat, knees ahead of toes, dec weightbearing to heels  Posture: Moderate FHRS, R>L mild genu valgum  AROM AROM (Normal range in degrees) AROM 03/31/24  Hip Right Left  Flexion (125) WNL   Extension (15)    Abduction (40)  Adduction     Internal Rotation (45) WNL   External Rotation (45) WNL       Knee    Flexion (135) 124   Extension (0) +9       Ankle    Dorsiflexion (20) 15   Plantarflexion (50)    Inversion (35)    Eversion (15    (* = pain; Blank rows = not tested)  LE MMT: MMT (out of 5) Right 03/31/24 Left 03/31/24 Right 04/30/24 Left 04/30/24  Hip flexion 4- 4 4 4+  Hip extension 4 4 4 4   Hip abduction 4-  4   Hip adduction      Hip internal rotation      Hip external rotation      Knee flexion 4- 5 5 4+  Knee extension 4 5- 5- 4+*  Ankle dorsiflexion 5 5    Ankle plantarflexion      Ankle inversion      Ankle eversion      (* = pain; Blank rows = not tested)  Sensation Deferred  Reflexes Deferred  Muscle Length Hamstrings: R: Negative L: Negative Quadriceps (Ely): R: Positive L: Positive   Palpation Location LEFT  RIGHT           Quadriceps  0  Medial Hamstrings  1  Lateral Hamstrings  1   Lateral Hamstring tendon  1  Medial Hamstring tendon  1  Quadriceps tendon  0  Patella    Patellar Tendon  2  Tibial Tuberosity  2  Medial joint line  0  Lateral joint line  0  MCL  1  LCL  1  Adductor Tubercle    Pes Anserine tendon    Infrapatellar fat pad  0  Fibular head  1  Popliteal fossa    (Blank rows = not tested) Graded on 0-4 scale (0 = no pain, 1 = pain, 2 = pain with wincing/grimacing/flinching, 3 = pain with withdrawal, 4 = unwilling to allow palpation), (Blank rows = not tested)  Passive Accessory Motion Patellofemoral: Superior Glide: R: Negative L: Negative Inferior Glide: R: Negative L: Negative Medial Glide: R: Negative L: Negative Lateral Glide: R: Negative L: Negative    VASCULAR Deferred   SPECIAL TESTS  Patellar Tendinopathy Inferior pole palpation with anterior tilt: R: Positive L: Not examined      TODAY'S TREATMENT   05/07/2024:   SUBJECTIVE STATEMENT:   Patient reports challenge with L knee pain hurting a lot with stair descent. Patient reports ongoing challenge with R TKA. Patient reports she has not yet been able to get a hold of the orthopedist's office. Patient reports she will likely need to f/u with orthopedist first prior to scheduling more PT, as this is her last scheduled visit at this time. Patient reports doing okay this AM in regard to knee pain. Mornings are generally uneventful per patient; she reports more discomfort getting further into day. Pt states she will follow up with orthopedist prior to continuing PT.    Therapeutic Exercise - for improved soft tissue flexibility and extensibility as needed for ROM, improved strength as needed to improve performance of CKC activities/functional movements  SciFit x 6 minutes - Level 7 - for improved soft tissue mobility and increased tissue temperature to improve muscle performance   SLR; 2 x 10, 5-lb ankle weight  Sidelying hip abduction; 2 x 10; 2-lb ankle weight; bilat Bridge  with BOSU ball; 2 x 10, 5 sec isometric hold  PATIENT EDUCATION: HEP update/review. Recommended follow-up with MD regarding persistent pain after 1-year healing window for joint replacement.   *not today* Seated Hamstring stretch, reviewed Standing gastrocnemius lunge stretch, reviewed.  Passive prone quad stretch; 3 x 30 sec for R quadriceps Prone alternating hip extension; 2 x 10 alt R/L   Therapeutic Activity:  Total Gym single-limb minisquat; Level 22, on RLE; 2 x 15  Sit to stand, chair + Airex for increased seat height; 2 x 10 with no UE support, Black Tband for hip ABD isometric  Standing reverse lunge; 2 x 10 alternating R/L  Single limb stance, on Airex; intermittent light touch on bar; multiple attempts with up to 30 sec obtained  Forward step up to hurdle stance; 2 x 10, bilat   *not today* Lateral stepdown, 6-inch step; 2 x 10 (standing on RLE today) Multidirectional stepping on blue star; stepping to end of blue line and back to center of star (5 directions; anterior, anterolateral R/L, lateral R/L); x 5 ea dir Tandem standing on long Airex; 1 x 30 sec  Lateral step over 6 hurdle 2 x 10 each direction - no UE support  TRX mini squats 3 x 10 (within pain tolerance) Standing marching x 30 with single UE support with 3# AW each  Standing hip abduction 3 x 10 with B UE support with 3# AW each  Standing hip extension 3 x 10 with BUE support with 3# AW each  Fwd step downs 6 step 2 x 10 leading with each LE  Monster walk; Blue Tband just above patellae; 3x D/B length of // bars  -catching pain along contralateral (L) knee with successive steps, stopped at 3 reps due to this STM and IASTM with Theraband roller along R quads; x 6 minutes Bridge with Black Tband; 2 x 10, 3-sec isometric 3-way touch with isometric minisquat, Red Tband around ankles; x 8 ea dir, bilat  Minisquat with 5-sec isometric; 2 x 8, 5 sec hold at bottom SLS on Airex; 2 x 20 sec with  intermittent light touch Lateral step-up, 6-inch step; 2 x 10   PATIENT EDUCATION:  Education details: see above for patient education details Person educated: Patient Education method: Explanation, Demonstration, and Handouts Education comprehension: verbalized understanding and returned demonstration   HOME EXERCISE PROGRAM:  Access Code: [email or web address] URL: [email or web address] Date: 05/07/2024 Prepared by: Venetia Endo  Exercises - Prone Quadriceps Stretch with Strap  - 2 x daily - 7 x weekly - 3 sets - 30sec hold - Seated Hamstring Stretch  - 2 x daily - 7 x weekly - 3 sets - 30sec hold - Gastroc Stretch on Wall  - 2 x daily - 7 x weekly - 3 sets - 30sec hold - Active Straight Leg Raise with Quad Set  - 2 x daily - 7 x weekly - 2 sets - 10 reps - Sidelying Hip Abduction  - 2 x daily - 7 x weekly - 2 sets - 10 reps - Single Leg Stance  - 2 x daily - 7 x weekly - 3 sets - 30sec hold - Supine Bridge with Resistance Band  - 2 x daily - 5-7 x weekly - 2 sets - 10 reps - Runner's Step up with Arms Forward  - 2 x daily - 5-7 x weekly - 2 sets - 10 reps - Reverse Lunge  - 2 x daily - 5-7 x weekly - 2 sets - 10 reps   ASSESSMENT:  CLINICAL IMPRESSION:  Patient reports ongoing  deficits with prolonged standing and completion of outdoor work in spite of 6 weeks of target exercise to promote stabilization, strengthen quad and hips, and address quad/HS/gastrocnemius tightness. Pt has minimal pain in non-weightbearing and during sedentary activity. However, pt is very active and completes regular home projects and yard maintenance, and she feels she is still limited with completion of these prolonged standing tasks due to R knee pain s/p TKA in Oct 2024 and comorbid L knee pain. Given modest progress to date, pt has been advised to follow-up with orthopedist's office. Pt elects to hold on PT until further f/u with orthopedics. Pt will continue to benefit from skilled PT services to  address deficits and improve function (pending f/u with MD).  OBJECTIVE IMPAIRMENTS: Abnormal gait, difficulty walking, decreased ROM, decreased strength, hypomobility, impaired flexibility, and pain.   ACTIVITY LIMITATIONS: lifting, bending, sitting, standing, squatting, stairs, transfers, bed mobility, and locomotion level  PARTICIPATION LIMITATIONS: cleaning, driving, shopping, community activity, and yard work  PERSONAL FACTORS: Age, Past/current experiences, Time since onset of injury/illness/exacerbation, and 1-2 comorbidities: (Hx of breast cancer, OA) are also affecting patient's functional outcome.   REHAB POTENTIAL: Fair given length of time from Sx and chronicity  CLINICAL DECISION MAKING: Evolving/moderate complexity  EVALUATION COMPLEXITY: Moderate   GOALS: Goals reviewed with patient? Yes  SHORT TERM GOALS: Target date: 04/24/2024  Pt will be independent with HEP to improve strength and decrease knee pain to improve pain-free function at home and work. Baseline: 03/31/24: Baseline HEP initiated.   04/30/24: Compliant with HEP.  Goal status: ACHIEVED   LONG TERM GOALS: Target date: 05/15/2024  Pt will demonstrate pain-free sit to stand from standard-height chair without dynamic valgus compensation indicative of improved functional LE strength.  Baseline: 03/31/24: Pain with sit to stand, dynamic valgus movement fault.   04/30/24: Dynamic valgus, mild pain reported; pt able to perform readily  Goal status: ON-GOING  2.  Pt will decrease worst knee pain by at least 3 points on the NPRS in order to demonstrate clinically significant reduction in knee pain. Baseline: 03/31/24: 8-9/10 at worst.    04/30/24: 8/10 at worst.  Goal status: NOT MET   3.  Pt will decrease LEFS score by at least 9 points in order demonstrate clinically significant reduction in knee pain/disability.       Baseline: 03/31/24: 51/80 = 63.75%   04/30/24:  49/80 = 61.25% Goal status: NOT MET   4.   Pt will increase strength of R hip and knee tested musculature to at least 4+/5 or greater MMT grade in order to demonstrate improvement in strength and function  Baseline: 03/31/24: 4- to 4/5 (see chart above).   04/30/24: Good quad and hamstring strength, remaining hip abduction and extension strength deficit. Goal status: IN PROGRESS   PLAN: PT FREQUENCY: 1-2x/week  PT DURATION: 4 weeks  PLANNED INTERVENTIONS: Therapeutic exercises, Therapeutic activity, Neuromuscular re-education, Balance training, Gait training, Patient/Family education, Self Care, Joint mobilization, Joint manipulation, Vestibular training, Canalith repositioning, Orthotic/Fit training, DME instructions, Dry Needling, Electrical stimulation, Spinal manipulation, Spinal mobilization, Cryotherapy, Moist heat, Taping, Traction, Ultrasound, Ionotophoresis 4mg /ml Dexamethasone , Manual therapy, and Re-evaluation.  PLAN FOR NEXT SESSION: Quad/HS/gastroc flexibility, quad and gluteal strengthening, isometric exercise to promote stability and progressive CKC stabilization drills.    Venetia Endo, PT, DPT [phone] Physical Therapist - [facility] 05/07/2024, 2:07 PM

## 2024-05-26 ENCOUNTER — Encounter: Payer: Self-pay | Admitting: Physician Assistant

## 2024-06-13 ENCOUNTER — Encounter: Admitting: Physician Assistant

## 2024-06-18 ENCOUNTER — Other Ambulatory Visit: Payer: Self-pay | Admitting: Orthopedic Surgery

## 2024-06-18 DIAGNOSIS — Z96651 Presence of right artificial knee joint: Secondary | ICD-10-CM

## 2024-06-23 ENCOUNTER — Ambulatory Visit: Admission: RE | Admit: 2024-06-23 | Discharge: 2024-06-23 | Attending: Orthopedic Surgery

## 2024-06-23 DIAGNOSIS — Z96651 Presence of right artificial knee joint: Secondary | ICD-10-CM | POA: Diagnosis present

## 2024-06-23 MED ORDER — TECHNETIUM TC 99M MEDRONATE IV KIT
20.0000 | PACK | Freq: Once | INTRAVENOUS | Status: AC | PRN
  Administered 2024-06-23: 23.49 via INTRAVENOUS

## 2024-09-11 ENCOUNTER — Encounter: Admitting: Physician Assistant

## 2024-11-20 ENCOUNTER — Ambulatory Visit
